# Patient Record
Sex: Female | Born: 1945 | Race: White | Hispanic: No | Marital: Married | Smoking: Current some day smoker
Health system: Southern US, Community
[De-identification: ages and names within clinical notes are randomized; demographics above are authoritative.]

## PROBLEM LIST (undated history)

## (undated) DIAGNOSIS — R300 Dysuria: Secondary | ICD-10-CM

## (undated) DIAGNOSIS — N362 Urethral caruncle: Secondary | ICD-10-CM

## (undated) DIAGNOSIS — C4490 Unspecified malignant neoplasm of skin, unspecified: Secondary | ICD-10-CM

## (undated) DIAGNOSIS — Z923 Personal history of irradiation: Secondary | ICD-10-CM

## (undated) DIAGNOSIS — R102 Pelvic and perineal pain: Secondary | ICD-10-CM

## (undated) DIAGNOSIS — D510 Vitamin B12 deficiency anemia due to intrinsic factor deficiency: Secondary | ICD-10-CM

## (undated) DIAGNOSIS — I119 Hypertensive heart disease without heart failure: Secondary | ICD-10-CM

## (undated) DIAGNOSIS — N904 Leukoplakia of vulva: Secondary | ICD-10-CM

## (undated) DIAGNOSIS — I10 Essential (primary) hypertension: Secondary | ICD-10-CM

## (undated) DIAGNOSIS — Z87442 Personal history of urinary calculi: Secondary | ICD-10-CM

## (undated) DIAGNOSIS — I255 Ischemic cardiomyopathy: Secondary | ICD-10-CM

## (undated) DIAGNOSIS — D071 Carcinoma in situ of vulva: Secondary | ICD-10-CM

## (undated) DIAGNOSIS — C719 Malignant neoplasm of brain, unspecified: Secondary | ICD-10-CM

## (undated) DIAGNOSIS — Z972 Presence of dental prosthetic device (complete) (partial): Secondary | ICD-10-CM

## (undated) DIAGNOSIS — Z720 Tobacco use: Secondary | ICD-10-CM

## (undated) DIAGNOSIS — Z87891 Personal history of nicotine dependence: Secondary | ICD-10-CM

## (undated) DIAGNOSIS — R42 Dizziness and giddiness: Secondary | ICD-10-CM

## (undated) DIAGNOSIS — K589 Irritable bowel syndrome without diarrhea: Secondary | ICD-10-CM

## (undated) DIAGNOSIS — N9089 Other specified noninflammatory disorders of vulva and perineum: Secondary | ICD-10-CM

## (undated) DIAGNOSIS — F419 Anxiety disorder, unspecified: Secondary | ICD-10-CM

## (undated) DIAGNOSIS — I2510 Atherosclerotic heart disease of native coronary artery without angina pectoris: Secondary | ICD-10-CM

## (undated) DIAGNOSIS — I219 Acute myocardial infarction, unspecified: Secondary | ICD-10-CM

## (undated) DIAGNOSIS — E785 Hyperlipidemia, unspecified: Secondary | ICD-10-CM

## (undated) HISTORY — DX: Tobacco use: Z72.0

## (undated) HISTORY — DX: Other specified noninflammatory disorders of vulva and perineum: N90.89

## (undated) HISTORY — DX: Hypertensive heart disease without heart failure: I11.9

## (undated) HISTORY — PX: CERVICAL BIOPSY: SHX590

## (undated) HISTORY — DX: Irritable bowel syndrome, unspecified: K58.9

## (undated) HISTORY — DX: Atherosclerotic heart disease of native coronary artery without angina pectoris: I25.10

## (undated) HISTORY — DX: Unspecified malignant neoplasm of skin, unspecified: C44.90

## (undated) HISTORY — PX: BRAIN SURGERY: SHX531

## (undated) HISTORY — DX: Vitamin B12 deficiency anemia due to intrinsic factor deficiency: D51.0

## (undated) HISTORY — DX: Ischemic cardiomyopathy: I25.5

## (undated) HISTORY — DX: Essential (primary) hypertension: I10

## (undated) HISTORY — PX: MOHS SURGERY: SHX181

## (undated) HISTORY — DX: Personal history of nicotine dependence: Z87.891

## (undated) HISTORY — PX: CHOLECYSTECTOMY: SHX55

## (undated) HISTORY — PX: SKIN BIOPSY: SHX1

## (undated) HISTORY — DX: Personal history of urinary calculi: Z87.442

## (undated) HISTORY — PX: APPENDECTOMY: SHX54

## (undated) HISTORY — DX: Urethral caruncle: N36.2

## (undated) HISTORY — DX: Dysuria: R30.0

## (undated) HISTORY — DX: Hyperlipidemia, unspecified: E78.5

## (undated) HISTORY — DX: Pelvic and perineal pain: R10.2

## (undated) HISTORY — DX: Carcinoma in situ of vulva: D07.1

## (undated) HISTORY — DX: Leukoplakia of vulva: N90.4

---

## 1976-05-13 HISTORY — PX: ABDOMINAL HYSTERECTOMY: SHX81

## 2003-05-10 ENCOUNTER — Other Ambulatory Visit: Payer: Self-pay

## 2005-06-12 ENCOUNTER — Ambulatory Visit: Payer: Self-pay | Admitting: Unknown Physician Specialty

## 2005-06-20 ENCOUNTER — Ambulatory Visit: Payer: Self-pay | Admitting: Unknown Physician Specialty

## 2005-07-05 ENCOUNTER — Ambulatory Visit: Payer: Self-pay | Admitting: Gastroenterology

## 2006-02-24 ENCOUNTER — Other Ambulatory Visit: Payer: Self-pay

## 2006-02-28 ENCOUNTER — Ambulatory Visit: Payer: Self-pay | Admitting: Surgery

## 2006-06-23 ENCOUNTER — Ambulatory Visit: Payer: Self-pay | Admitting: Unknown Physician Specialty

## 2007-04-24 ENCOUNTER — Other Ambulatory Visit: Payer: Self-pay

## 2007-04-24 ENCOUNTER — Ambulatory Visit: Payer: Self-pay | Admitting: Surgery

## 2007-04-30 ENCOUNTER — Ambulatory Visit: Payer: Self-pay | Admitting: Surgery

## 2007-07-23 ENCOUNTER — Ambulatory Visit: Payer: Self-pay | Admitting: Unknown Physician Specialty

## 2010-02-22 ENCOUNTER — Ambulatory Visit: Payer: Self-pay | Admitting: Family Medicine

## 2010-06-12 NOTE — Assessment & Plan Note (Signed)
Summary: FLU SHOT/JBB  Nurse Visit   Immunizations Administered:  Influenza Vaccine:    Vaccine Type: FLUZONE    Site: right deltoid    Mfr: Sanofi Pasteur    Dose: 0.5 ml    Route: IM    Given by: Levonne Spiller EMT-P    Exp. Date: 10/23/2010    Lot #: [id number]    VIS given: 12/05/09 version given February 22, 2010.   Immunizations Administered:  Influenza Vaccine:    Vaccine Type: FLUZONE    Site: right deltoid    Mfr: Sanofi Pasteur    Dose: 0.5 ml    Route: IM    Given by: Levonne Spiller EMT-P    Exp. Date: 10/23/2010    Lot #: [id number]    VIS given: 12/05/09 version given February 22, 2010.  Flu Vaccine Consent Questions:    Do you have a history of severe allergic reactions to this vaccine? no    Any prior history of allergic reactions to egg and/or gelatin? no    Do you have a sensitivity to the preservative Thimersol? no    Do you have a past history of Guillan-Barre Syndrome? no    Do you currently have an acute febrile illness? no    Have you ever had a severe reaction to latex? no    Vaccine information given and explained to patient? yes    Are you currently pregnant? no

## 2010-09-19 ENCOUNTER — Ambulatory Visit: Payer: Self-pay | Admitting: Unknown Physician Specialty

## 2011-05-08 LAB — HM PAP SMEAR: HM Pap smear: NORMAL

## 2011-08-07 ENCOUNTER — Ambulatory Visit: Payer: Self-pay | Admitting: Gastroenterology

## 2011-08-12 LAB — PATHOLOGY REPORT

## 2011-10-06 LAB — HM COLONOSCOPY

## 2012-01-06 ENCOUNTER — Ambulatory Visit (INDEPENDENT_AMBULATORY_CARE_PROVIDER_SITE_OTHER): Payer: Medicare Other | Admitting: Internal Medicine

## 2012-01-06 ENCOUNTER — Encounter: Payer: Self-pay | Admitting: Internal Medicine

## 2012-01-06 VITALS — BP 122/82 | HR 72 | Temp 98.7°F | Ht 64.5 in | Wt 161.5 lb

## 2012-01-06 DIAGNOSIS — R195 Other fecal abnormalities: Secondary | ICD-10-CM | POA: Insufficient documentation

## 2012-01-06 DIAGNOSIS — Z1239 Encounter for other screening for malignant neoplasm of breast: Secondary | ICD-10-CM | POA: Insufficient documentation

## 2012-01-06 DIAGNOSIS — E785 Hyperlipidemia, unspecified: Secondary | ICD-10-CM | POA: Insufficient documentation

## 2012-01-06 DIAGNOSIS — I10 Essential (primary) hypertension: Secondary | ICD-10-CM | POA: Insufficient documentation

## 2012-01-06 NOTE — Assessment & Plan Note (Signed)
Patient reports that stool has become darker in color since starting multivitamin prescribed by her ophthalmologist. Suspect this is related to increased intake of beta carotene. Patient is up-to-date on her colonoscopy. However, will send stool for Hemoccult.

## 2012-01-06 NOTE — Assessment & Plan Note (Signed)
Will check lipids with labs in December 2013.

## 2012-01-06 NOTE — Progress Notes (Signed)
Subjective:    Patient ID: Susan Mcmillan, female    DOB: Sep 18, 1945, 66 y.o.   MRN: [id number]  HPI 66 year old female with history of hypertension presents to establish care. She reports that she is generally feeling well. She reports full compliance with her medications. She denies any recent chest pain, headache, palpitations, or other complaints. She is unsure the last time she had lab work performed. She reports that she is overdue for her mammogram. She does note that she is up-to-date on her colonoscopy. She reports that ever since starting a new multivitamin provided by her ophthalmologist, she has noticed some darker stools. She denies any frank blood in her stool. She has not had any diarrhea or constipation. 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  Outpatient Encounter Prescriptions as of 01/06/2012  Medication Sig Dispense Refill  . ALPRAZolam (XANAX) 0.25 MG tablet Take 0.5 mg by mouth at bedtime as needed.      . beta carotene w/minerals (OCUVITE) tablet Take 1 tablet by mouth daily.      . Cholecalciferol (VITAMIN D-3) 1000 UNITS CAPS Take 1 capsule by mouth daily.      . folic acid (FOLVITE) 400 MCG tablet Take 400 mcg by mouth daily.      . metoprolol succinate (TOPROL-XL) 50 MG 24 hr tablet Take 50 mg by mouth daily. Take with or immediately following a meal.      . Nutritional Supplements (ESTROVEN WEIGHT MANAGEMENT PO) Take 1 tablet by mouth daily.      Marland Kitchen triamterene-hydrochlorothiazide (MAXZIDE-25) 37.5-25 MG per tablet Take 1 tablet by mouth daily.       BP 122/82  Pulse 72  Temp 98.7 F (37.1 C) (Oral)  Ht 5' 4.5" (1.638 m)  Wt 161 lb 8 oz (73.256 kg)  BMI 27.29 kg/m2  SpO2 96%  Review of Systems  Constitutional: Negative for fever, chills, appetite change, fatigue and unexpected weight change.  HENT: Negative for ear pain, congestion, sore throat, trouble swallowing, neck pain, voice change  and sinus pressure.   Eyes: Negative for visual disturbance.  Respiratory: Negative for cough, shortness of breath, wheezing and stridor.   Cardiovascular: Negative for chest pain, palpitations and leg swelling.  Gastrointestinal: Negative for nausea, vomiting, abdominal pain, diarrhea, constipation, blood in stool, abdominal distention and anal bleeding.  Genitourinary: Negative for dysuria and flank pain.  Musculoskeletal: Negative for myalgias, arthralgias and gait problem.  Skin: Negative for color change and rash.  Neurological: Negative for dizziness and headaches.  Hematological: Negative for adenopathy. Does not bruise/bleed easily.  Psychiatric/Behavioral: Negative for suicidal ideas, disturbed wake/sleep cycle and dysphoric mood. The patient is not nervous/anxious.        Objective:   Physical Exam  Constitutional: She is oriented to person, place, and time. She appears well-developed and well-nourished. No distress.  HENT:  Head: Normocephalic and atraumatic.  Right Ear: External ear normal.  Left Ear: External ear normal.  Nose: Nose normal.  Mouth/Throat: Oropharynx is clear and moist. No oropharyngeal exudate.  Eyes: Conjunctivae are normal. Pupils are equal, round, and reactive to light. Right eye exhibits no discharge. Left eye exhibits no discharge. No scleral icterus.  Neck: Normal range of motion. Neck supple. No tracheal deviation present. No thyromegaly present.  Cardiovascular: Normal rate, regular rhythm, normal heart sounds and intact distal pulses.  Exam reveals no gallop and no friction rub.   No murmur heard. Pulmonary/Chest: Effort normal and  breath sounds normal. No respiratory distress. She has no wheezes. She has no rales. She exhibits no tenderness.  Abdominal: Soft. Bowel sounds are normal. She exhibits no distension and no mass. There is no tenderness. There is no guarding.  Musculoskeletal: Normal range of motion. She exhibits no edema and no  tenderness.  Lymphadenopathy:    She has no cervical adenopathy.  Neurological: She is alert and oriented to person, place, and time. No cranial nerve deficit. She exhibits normal muscle tone. Coordination normal.  Skin: Skin is warm and dry. No rash noted. She is not diaphoretic. No erythema. No pallor.  Psychiatric: She has a normal mood and affect. Her behavior is normal. Judgment and thought content normal.          Assessment & Plan:

## 2012-01-06 NOTE — Assessment & Plan Note (Signed)
Blood pressure well-controlled today. We'll continue current medications. Patient would prefer to defer lab work until physical exam in December 2013. We'll check renal function with labs at that time.

## 2012-01-07 ENCOUNTER — Encounter: Payer: Self-pay | Admitting: Internal Medicine

## 2012-01-07 ENCOUNTER — Ambulatory Visit: Payer: Self-pay | Admitting: Internal Medicine

## 2012-01-08 ENCOUNTER — Other Ambulatory Visit: Payer: Self-pay | Admitting: Internal Medicine

## 2012-01-08 DIAGNOSIS — Z1211 Encounter for screening for malignant neoplasm of colon: Secondary | ICD-10-CM

## 2012-01-09 ENCOUNTER — Telehealth: Payer: Self-pay | Admitting: Internal Medicine

## 2012-01-09 ENCOUNTER — Other Ambulatory Visit: Payer: Medicare Other

## 2012-01-09 DIAGNOSIS — Z1211 Encounter for screening for malignant neoplasm of colon: Secondary | ICD-10-CM

## 2012-01-09 NOTE — Telephone Encounter (Signed)
Mammogram showed some dense areas in the left breast. They recommended compression films.  Has this been set up Delford Field)?

## 2012-01-10 NOTE — Telephone Encounter (Signed)
Patient advised as instructed via telephone, she will call to schedule additional views.

## 2012-01-14 ENCOUNTER — Other Ambulatory Visit (INDEPENDENT_AMBULATORY_CARE_PROVIDER_SITE_OTHER): Payer: Medicare Other | Admitting: *Deleted

## 2012-01-14 DIAGNOSIS — I10 Essential (primary) hypertension: Secondary | ICD-10-CM

## 2012-01-14 DIAGNOSIS — Z79899 Other long term (current) drug therapy: Secondary | ICD-10-CM

## 2012-01-14 DIAGNOSIS — R195 Other fecal abnormalities: Secondary | ICD-10-CM

## 2012-01-14 LAB — CBC WITH DIFFERENTIAL/PLATELET
Basophils Absolute: 0 10*3/uL (ref 0.0–0.1)
Eosinophils Relative: 1.3 % (ref 0.0–5.0)
HCT: 43.4 % (ref 36.0–46.0)
Lymphs Abs: 1.6 10*3/uL (ref 0.7–4.0)
MCV: 96.2 fl (ref 78.0–100.0)
Monocytes Absolute: 0.5 10*3/uL (ref 0.1–1.0)
Platelets: 258 10*3/uL (ref 150.0–400.0)
RDW: 13 % (ref 11.5–14.6)

## 2012-01-14 LAB — FERRITIN: Ferritin: 24 ng/mL (ref 10.0–291.0)

## 2012-01-16 NOTE — Progress Notes (Signed)
Patient states she is not going her blood work was fine.

## 2012-01-17 ENCOUNTER — Telehealth: Payer: Self-pay | Admitting: Internal Medicine

## 2012-01-17 NOTE — Telephone Encounter (Signed)
Calling patient to give her an appointment with Dr. Bluford Kaufmann . She informed me she is better and will not need the appointment.

## 2012-01-21 ENCOUNTER — Encounter: Payer: Self-pay | Admitting: Internal Medicine

## 2012-01-29 ENCOUNTER — Ambulatory Visit: Payer: Self-pay | Admitting: Internal Medicine

## 2012-01-29 LAB — HM MAMMOGRAPHY

## 2012-01-31 ENCOUNTER — Telehealth: Payer: Self-pay | Admitting: Internal Medicine

## 2012-01-31 NOTE — Telephone Encounter (Signed)
Patient advised as instructed via telephone. 

## 2012-01-31 NOTE — Telephone Encounter (Signed)
[place] of left breast showed benign appearing lymph nodes. Recommended repeat mammogram in 6 months.

## 2012-02-06 NOTE — Progress Notes (Signed)
[facility] will give a call back to schedule patient a follow up appointment.

## 2012-02-19 ENCOUNTER — Encounter: Payer: Self-pay | Admitting: Internal Medicine

## 2012-05-07 ENCOUNTER — Ambulatory Visit (INDEPENDENT_AMBULATORY_CARE_PROVIDER_SITE_OTHER): Payer: Medicare Other | Admitting: Internal Medicine

## 2012-05-07 ENCOUNTER — Encounter: Payer: Self-pay | Admitting: Internal Medicine

## 2012-05-07 VITALS — BP 120/80 | HR 61 | Temp 97.7°F | Resp 16 | Ht 63.0 in | Wt 160.5 lb

## 2012-05-07 DIAGNOSIS — E785 Hyperlipidemia, unspecified: Secondary | ICD-10-CM

## 2012-05-07 DIAGNOSIS — Z1331 Encounter for screening for depression: Secondary | ICD-10-CM

## 2012-05-07 DIAGNOSIS — F17200 Nicotine dependence, unspecified, uncomplicated: Secondary | ICD-10-CM

## 2012-05-07 DIAGNOSIS — D649 Anemia, unspecified: Secondary | ICD-10-CM

## 2012-05-07 DIAGNOSIS — R9431 Abnormal electrocardiogram [ECG] [EKG]: Secondary | ICD-10-CM

## 2012-05-07 DIAGNOSIS — Z0000 Encounter for general adult medical examination without abnormal findings: Secondary | ICD-10-CM

## 2012-05-07 DIAGNOSIS — Z720 Tobacco use: Secondary | ICD-10-CM | POA: Insufficient documentation

## 2012-05-07 DIAGNOSIS — Z1239 Encounter for other screening for malignant neoplasm of breast: Secondary | ICD-10-CM

## 2012-05-07 DIAGNOSIS — I10 Essential (primary) hypertension: Secondary | ICD-10-CM

## 2012-05-07 DIAGNOSIS — R195 Other fecal abnormalities: Secondary | ICD-10-CM

## 2012-05-07 DIAGNOSIS — N951 Menopausal and female climacteric states: Secondary | ICD-10-CM

## 2012-05-07 LAB — LIPID PANEL
HDL: 39.7 mg/dL (ref >39.00)
Total CHOL/HDL Ratio: 6
VLDL: 49.6 mg/dL — ABNORMAL HIGH (ref 0.0–40.0)

## 2012-05-07 LAB — CBC WITH DIFFERENTIAL/PLATELET
Basophils Relative: 0.7 % (ref 0.0–3.0)
Eosinophils Relative: 1 % (ref 0.0–5.0)
HCT: 42.5 % (ref 36.0–46.0)
Hemoglobin: 14.7 g/dL (ref 12.0–15.0)
Lymphocytes Relative: 24.7 % (ref 12.0–46.0)
Lymphs Abs: 1.5 10*3/uL (ref 0.7–4.0)
Monocytes Relative: 7.4 % (ref 3.0–12.0)
Neutro Abs: 4.1 10*3/uL (ref 1.4–7.7)
RBC: 4.58 Mil/uL (ref 3.87–5.11)
WBC: 6.3 10*3/uL (ref 4.5–10.5)

## 2012-05-07 LAB — COMPREHENSIVE METABOLIC PANEL
ALT: 20 U/L (ref 0–35)
CO2: 29 mEq/L (ref 19–32)
Calcium: 9.5 mg/dL (ref 8.4–10.5)
Chloride: 98 mEq/L (ref 96–112)
Creatinine, Ser: 0.8 mg/dL (ref 0.4–1.2)
GFR: 77.37 mL/min (ref >60.00)
Glucose, Bld: 92 mg/dL (ref 70–99)
Total Bilirubin: 0.6 mg/dL (ref 0.3–1.2)

## 2012-05-07 LAB — LDL CHOLESTEROL, DIRECT: Direct LDL: 147.2 mg/dL

## 2012-05-07 LAB — MICROALBUMIN / CREATININE URINE RATIO: Microalb Creat Ratio: 0.2 mg/g (ref 0.0–30.0)

## 2012-05-07 MED ORDER — METOPROLOL SUCCINATE ER 50 MG PO TB24: 50.0000 mg | ORAL_TABLET | Freq: Every day | ORAL | Status: DC

## 2012-05-07 MED ORDER — TRIAMTERENE-HCTZ 37.5-25 MG PO TABS: 0.5000 | ORAL_TABLET | Freq: Every day | ORAL | Status: DC

## 2012-05-07 NOTE — Assessment & Plan Note (Addendum)
Pt with persistent hot flashes. Discussed possibly using OTC meds such as soy or black cohash. Also discussed hormone replacement and potential risks. Discussed using SSRI such as Paxil, as this likely has best side effect profile. Pt will continue to monitor symptoms for now and let us know if she would like to try SSRI

## 2012-05-07 NOTE — Assessment & Plan Note (Signed)
EKG shows some t-wave inversions and some ST depression. Pt is asymptomatic. Reports normal stress test in the past, so will try to get copy of this and old EKG for comparison. Pt declines repeat stress test.

## 2012-05-07 NOTE — Assessment & Plan Note (Signed)
Mammogram abnormal 01/2012, plan repeat 6 months, 07/2012

## 2012-05-07 NOTE — Assessment & Plan Note (Signed)
Encouraged smoking cessation 

## 2012-05-07 NOTE — Assessment & Plan Note (Addendum)
Colonoscopy showed no clear etiology of dark stools. Symptoms have improved. CBC showed Hgb stable.  Pt has follow up pending for upper endoscopy, but is planning to pay of medical bills from colonoscopy first. She will schedule once she is financially able.

## 2012-05-07 NOTE — Assessment & Plan Note (Addendum)
General medical exam including breast exam normal today.  PAP and pelvic deferred as s/p hysterectomy. Health maintenance is UTD except for Zostavax, which pt declines. Recent Mammogram 01/2012, was BIRADS 3, and needs repeat 07/2012.  EKG today shows some non-specific changes, so will request records on previous EKG and stress test.  Encouraged healthy diet, low in saturated fat and high in fiber.  Encouraged regular physical activity such as walking with goal of 3 x per week. Appropriate screening performed. Follow up 6 months and prn.

## 2012-05-07 NOTE — Assessment & Plan Note (Signed)
BP well controlled today. Renal function normal. Continue current medications. Follow up 6 months and prn.

## 2012-05-07 NOTE — Progress Notes (Signed)
Subjective:    Patient ID: Susan Mcmillan, female    DOB: 10-21-1945, 66 y.o.   MRN: [id number]  HPI The patient is here for annual Medicare wellness examination and management of other chronic and acute problems.   The risk factors are reflected in the social history.  The roster of all physicians providing medical care to patient - is listed in the Snapshot section of the chart.  Activities of daily living:  The patient is 100% independent in all ADLs: dressing, toileting, feeding as well as independent mobility  Home safety : The patient has smoke detectors in the home. They wear seatbelts.  There are locked firearms at home. There is no violence in the home.   There is no risks for hepatitis, STDs or HIV. There is no history of blood transfusion. They have no travel history to infectious disease endemic areas of the world.  The patient has seen their dentist in the last six month. [facility] - [facility] They have seen their eye doctor in the last year. Opthalmology - [facility] No issue with hearing. They have deferred audiologic testing in the last year.   They do not  have excessive sun exposure. Discussed the need for sun protection: hats, long sleeves and use of sunscreen if there is significant sun exposure. Derm - Dr. Orson Aloe.  Diet: the importance of a healthy diet is discussed. They do have a healthy diet.  The benefits of regular aerobic exercise were discussed. She walks 2x per week.  Depression screen: there are no signs or vegative symptoms of depression- irritability, change in appetite, anhedonia, sadness/tearfullness.  Cognitive assessment: the patient manages all their financial and personal affairs and is actively engaged. They could relate day,date,year and events.  HCPOA - Hermelinda Dellen and Corliss Marcus.  The following portions of the patient's history were reviewed and updated as appropriate: allergies, current medications, past family  history, past medical history,  past surgical history, past social history  and problem list.  Visual acuity was not assessed per patient preference since she has regular follow up with her ophthalmologist. Hearing and body mass index were assessed and reviewed.   During the course of the visit the patient was educated and counseled about appropriate screening and preventive services including : fall prevention , diabetes screening, nutrition counseling, colorectal cancer screening, and recommended immunizations.    Only concern today is persistent hot flashes which occur on a daily basis and affect quality of life. Tried black cohash with no improvement.   Outpatient Encounter Prescriptions as of 05/07/2012  Medication Sig Dispense Refill  . ALPRAZolam (XANAX) 0.25 MG tablet Take 0.5 mg by mouth at bedtime as needed.      Marland Kitchen ammonium lactate (AMLACTIN) 12 % cream       . beta carotene w/minerals (OCUVITE) tablet Take 1 tablet by mouth daily.      . Cholecalciferol (VITAMIN D-3) 1000 UNITS CAPS Take 1 capsule by mouth daily.      . cyanocobalamin (,VITAMIN B-12,) 1000 MCG/ML injection Inject 1,000 mcg into the muscle every 30 (thirty) days.      . folic acid (FOLVITE) 400 MCG tablet Take 400 mcg by mouth daily.      . metoprolol succinate (TOPROL-XL) 50 MG 24 hr tablet Take 50 mg by mouth daily. Take with or immediately following a meal.      . Nutritional Supplements (ESTROVEN WEIGHT MANAGEMENT PO) Take 1 tablet by mouth daily.      Marland Kitchen  triamterene-hydrochlorothiazide (MAXZIDE-25) 37.5-25 MG per tablet Take 0.5 tablets by mouth daily.         Review of Systems  Constitutional: Negative for fever, chills, appetite change, fatigue and unexpected weight change.  HENT: Negative for ear pain, congestion, sore throat, trouble swallowing, neck pain, voice change and sinus pressure.   Eyes: Negative for visual disturbance.  Respiratory: Negative for cough, shortness of breath, wheezing and stridor.     Cardiovascular: Negative for chest pain, palpitations and leg swelling.  Gastrointestinal: Negative for nausea, vomiting, abdominal pain, diarrhea, constipation, blood in stool, abdominal distention and anal bleeding.  Genitourinary: Negative for dysuria and flank pain.  Musculoskeletal: Negative for myalgias, arthralgias and gait problem.  Skin: Negative for color change and rash.  Neurological: Negative for dizziness and headaches.  Hematological: Negative for adenopathy. Does not bruise/bleed easily.  Psychiatric/Behavioral: Negative for suicidal ideas, sleep disturbance and dysphoric mood. The patient is not nervous/anxious.        Objective:   Physical Exam  Constitutional: She is oriented to person, place, and time. She appears well-developed and well-nourished. No distress.  HENT:  Head: Normocephalic and atraumatic.  Right Ear: External ear normal.  Left Ear: External ear normal.  Nose: Nose normal.  Mouth/Throat: Oropharynx is clear and moist. No oropharyngeal exudate.  Eyes: Conjunctivae normal are normal. Pupils are equal, round, and reactive to light. Right eye exhibits no discharge. Left eye exhibits no discharge. No scleral icterus.  Neck: Normal range of motion. Neck supple. No tracheal deviation present. No thyromegaly present.  Cardiovascular: Normal rate, regular rhythm, normal heart sounds and intact distal pulses.  Exam reveals no gallop and no friction rub.   No murmur heard. Pulmonary/Chest: Effort normal and breath sounds normal. No accessory muscle usage. Not tachypneic. No respiratory distress. She has no decreased breath sounds. She has no wheezes. She has no rhonchi. She has no rales. She exhibits no tenderness. Right breast exhibits no inverted nipple, no mass, no nipple discharge, no skin change and no tenderness. Left breast exhibits no inverted nipple, no mass, no nipple discharge, no skin change and no tenderness. Breasts are symmetrical.  Abdominal: Soft.  Bowel sounds are normal. She exhibits no distension and no mass. There is no tenderness. There is no rebound and no guarding.  Musculoskeletal: Normal range of motion. She exhibits no edema and no tenderness.  Lymphadenopathy:    She has no cervical adenopathy.  Neurological: She is alert and oriented to person, place, and time. No cranial nerve deficit. She exhibits normal muscle tone. Coordination normal.  Skin: Skin is warm and dry. No rash noted. She is not diaphoretic. No erythema. No pallor.  Psychiatric: She has a normal mood and affect. Her behavior is normal. Judgment and thought content normal.          Assessment & Plan:

## 2012-05-07 NOTE — Assessment & Plan Note (Signed)
Labs show that lipids are elevated. Goal LDL<100.  Will check with pt to see if she has ever been on statin medications.  If not, would prefer to start Lipitor 10mg  daily. Repeat lipids and LFTs in 3 months.

## 2012-05-08 MED ORDER — ATORVASTATIN CALCIUM 10 MG PO TABS: 10.0000 mg | ORAL_TABLET | Freq: Every day | ORAL | Status: DC

## 2012-05-08 NOTE — Addendum Note (Signed)
Addended by: Jackson Latino on: 05/08/2012 01:12 PM   Modules accepted: Orders

## 2012-05-13 HISTORY — PX: ABDOMINAL HERNIA REPAIR: SHX539

## 2012-05-29 ENCOUNTER — Other Ambulatory Visit: Payer: Self-pay | Admitting: Internal Medicine

## 2012-05-29 MED ORDER — CYANOCOBALAMIN 1000 MCG/ML IJ SOLN: 1000.0000 ug | INTRAMUSCULAR | Status: DC

## 2012-05-29 NOTE — Telephone Encounter (Signed)
Med filled.  

## 2012-05-29 NOTE — Telephone Encounter (Signed)
Pt called to get refill on meds.  Pt stated she left 3 message on voice mail yesterday. b12 rx  10ml bottle   [facility] Pt is out of meds. Please advise pt when this is called in.

## 2012-07-29 ENCOUNTER — Ambulatory Visit: Payer: Self-pay | Admitting: Internal Medicine

## 2012-08-03 ENCOUNTER — Telehealth: Payer: Self-pay | Admitting: Internal Medicine

## 2012-08-03 NOTE — Telephone Encounter (Signed)
Received note on mammogram from 07/29/2012. They recommended short interval follow up but did not give time for follow up. Did they discuss with patient? This was done at [facility]. We may need to call and ask them to give time for interval follow up. We should also make sure pt has follow up to discuss and review this.

## 2012-08-04 ENCOUNTER — Telehealth: Payer: Self-pay | Admitting: *Deleted

## 2012-08-04 ENCOUNTER — Other Ambulatory Visit: Payer: Self-pay | Admitting: Internal Medicine

## 2012-08-04 DIAGNOSIS — R928 Other abnormal and inconclusive findings on diagnostic imaging of breast: Secondary | ICD-10-CM

## 2012-08-04 NOTE — Telephone Encounter (Signed)
Vernona Rieger from [facility] returned your call and she stated that patients mammogram date of 03/19 has a six month f/u time frame

## 2012-08-04 NOTE — Telephone Encounter (Signed)
Called patient and they did not discuss this with her at all and I scheduled her an appointment to see you on 4/16. Called [facility] and spoke with Juliette Alcide, she stated she would get the message to someone to give [place] some more information

## 2012-08-05 ENCOUNTER — Telehealth: Payer: Self-pay | Admitting: Internal Medicine

## 2012-08-05 NOTE — Telephone Encounter (Signed)
This information was copied and pasted to previous encounter which has more details in reference to this.

## 2012-08-05 NOTE — Telephone Encounter (Signed)
Can you let pt know this? She will need mammogram in 01/2013.

## 2012-08-05 NOTE — Telephone Encounter (Signed)
I have now received another report from the hospital in regards to ultrasound of the left breast. They recommended reevaluation of a nodule in the left breast. I would like to set up referral to general surgery for further evaluation. We can proceed with this if patient agrees. Alternatively, she could come in for her visit here first

## 2012-08-05 NOTE — Telephone Encounter (Signed)
Algis Downs, CMA at 08/04/2012 2:46 PM   Status: Signed            Vernona Rieger from [facility] returned your call and she stated that patients mammogram date of 03/19 has a six month f/u time frame

## 2012-08-05 NOTE — Telephone Encounter (Signed)
Left message to call back  

## 2012-08-05 NOTE — Telephone Encounter (Signed)
Spoke with patient, she stated she received a letter stating she should have a follow up mammogram in 6 months to check that benign area. Please advise.

## 2012-08-05 NOTE — Telephone Encounter (Signed)
I would like to see her back and review the two studies and examine her.

## 2012-08-06 NOTE — Telephone Encounter (Signed)
Please refer to previous encounter for further information. 

## 2012-08-06 NOTE — Telephone Encounter (Signed)
Appointment scheduled for tomorrow at 330

## 2012-08-06 NOTE — Telephone Encounter (Signed)
She has an appointment scheduled for tomorrow with Dr. Dan Humphreys

## 2012-08-06 NOTE — Telephone Encounter (Signed)
LMTCB

## 2012-08-07 ENCOUNTER — Ambulatory Visit (INDEPENDENT_AMBULATORY_CARE_PROVIDER_SITE_OTHER): Payer: Medicare Other | Admitting: Internal Medicine

## 2012-08-07 ENCOUNTER — Encounter: Payer: Self-pay | Admitting: Internal Medicine

## 2012-08-07 ENCOUNTER — Ambulatory Visit: Payer: Medicare Other | Admitting: Internal Medicine

## 2012-08-07 VITALS — BP 134/84 | HR 56 | Temp 98.0°F | Wt 165.0 lb

## 2012-08-07 DIAGNOSIS — R590 Localized enlarged lymph nodes: Secondary | ICD-10-CM | POA: Insufficient documentation

## 2012-08-07 DIAGNOSIS — R928 Other abnormal and inconclusive findings on diagnostic imaging of breast: Secondary | ICD-10-CM

## 2012-08-07 DIAGNOSIS — R599 Enlarged lymph nodes, unspecified: Secondary | ICD-10-CM

## 2012-08-07 NOTE — Assessment & Plan Note (Signed)
Reviewed results on abnormal mammogram and ultrasound from 07/2012. Repeat breast exam today is normal. Will continue to monitor. If any symptoms of breast pain, change in skin color, new palpable changes, pt will notify the office and return to clinic. Discussed possibly getting general surgical evaluation for evaluation and possible biopsy if any palpable lesions present. Repeat mammogram 01/2013.

## 2012-08-07 NOTE — Progress Notes (Signed)
Subjective:    Patient ID: Susan Mcmillan, female    DOB: 1946/01/13, 66 y.o.   MRN: [id number]  HPI 67YO female presents for follow up after recent abnormal mammogram. Mammogram and ultrasound showed small nodular areas left breast. Pt denies any palpable abnormalities. She denies skin changes, tenderness, discharge from the nipple.  She notes small nodule in right upper neck present approx 1 week. Seems to be getting smaller. Non-tender. No skin changes. No recent URI symptoms.  Outpatient Encounter Prescriptions as of 08/07/2012  Medication Sig Dispense Refill  . ALPRAZolam (XANAX) 0.25 MG tablet Take 0.5 mg by mouth at bedtime as needed.      Marland Kitchen ammonium lactate (AMLACTIN) 12 % cream       . atorvastatin (LIPITOR) 10 MG tablet Take 1 tablet (10 mg total) by mouth daily.  30 tablet  3  . beta carotene w/minerals (OCUVITE) tablet Take 1 tablet by mouth daily.      . Cholecalciferol (VITAMIN D-3) 1000 UNITS CAPS Take 1 capsule by mouth daily.      . cyanocobalamin (,VITAMIN B-12,) 1000 MCG/ML injection Inject 1 mL (1,000 mcg total) into the muscle every 30 (thirty) days.  10 mL  0  . folic acid (FOLVITE) 400 MCG tablet Take 400 mcg by mouth daily.      . metoprolol succinate (TOPROL-XL) 50 MG 24 hr tablet Take 1 tablet (50 mg total) by mouth daily. Take with or immediately following a meal.  90 tablet  4  . Omega-3 Fatty Acids (FISH OIL) 1000 MG CAPS Take 1,000 mg by mouth.      . triamterene-hydrochlorothiazide (MAXZIDE-25) 37.5-25 MG per tablet Take 0.5 each (0.5 tablets total) by mouth daily.  90 tablet  4  . Nutritional Supplements (ESTROVEN WEIGHT MANAGEMENT PO) Take 1 tablet by mouth daily.       No facility-administered encounter medications on file as of 08/07/2012.   BP 134/84  Pulse 56  Temp(Src) 98 F (36.7 C) (Oral)  Wt 165 lb (74.844 kg)  BMI 29.24 kg/m2  SpO2 98%  Review of Systems  Constitutional: Negative for fever, chills, appetite change, fatigue and unexpected  weight change.  HENT: Negative for ear pain, congestion, sore throat, trouble swallowing, neck pain, voice change and sinus pressure.   Eyes: Negative for visual disturbance.  Respiratory: Negative for cough, shortness of breath, wheezing and stridor.   Cardiovascular: Negative for chest pain, palpitations and leg swelling.  Gastrointestinal: Negative for nausea, vomiting, abdominal pain, diarrhea, constipation, blood in stool, abdominal distention and anal bleeding.  Genitourinary: Negative for dysuria and flank pain.  Musculoskeletal: Negative for myalgias, arthralgias and gait problem.  Skin: Negative for color change and rash.  Neurological: Negative for dizziness and headaches.  Hematological: Positive for adenopathy. Does not bruise/bleed easily.  Psychiatric/Behavioral: Negative for suicidal ideas, sleep disturbance and dysphoric mood. The patient is not nervous/anxious.        Objective:   Physical Exam  Constitutional: She is oriented to person, place, and time. She appears well-developed and well-nourished. No distress.  HENT:  Head: Normocephalic and atraumatic.  Right Ear: External ear normal.  Left Ear: External ear normal.  Nose: Nose normal.  Mouth/Throat: Oropharynx is clear and moist. No oropharyngeal exudate.  Eyes: Conjunctivae are normal. Pupils are equal, round, and reactive to light. Right eye exhibits no discharge. Left eye exhibits no discharge. No scleral icterus.  Neck: Normal range of motion. Neck supple. No tracheal deviation present. No thyromegaly present.  Pulmonary/Chest: Effort normal. No accessory muscle usage. No respiratory distress. Right breast exhibits no inverted nipple, no mass, no nipple discharge, no skin change and no tenderness. Left breast exhibits no inverted nipple, no mass, no nipple discharge, no skin change and no tenderness. Breasts are symmetrical.  Musculoskeletal: Normal range of motion. She exhibits no edema and no tenderness.    Lymphadenopathy:    She has cervical adenopathy.       Right cervical: Superficial cervical adenopathy present.  Neurological: She is alert and oriented to person, place, and time. No cranial nerve deficit. She exhibits normal muscle tone. Coordination normal.  Skin: Skin is warm and dry. No rash noted. She is not diaphoretic. No erythema. No pallor.  Psychiatric: She has a normal mood and affect. Her behavior is normal. Judgment and thought content normal.          Assessment & Plan:

## 2012-08-07 NOTE — Assessment & Plan Note (Addendum)
Small shoddy node right superficial cervical region. Will monitor for now. If persists or increases in size then will set up evaluation with imaging and biopsy. Pt will call or email with update.

## 2012-08-18 ENCOUNTER — Encounter: Payer: Self-pay | Admitting: Internal Medicine

## 2012-08-26 ENCOUNTER — Ambulatory Visit: Payer: Medicare Other | Admitting: Internal Medicine

## 2012-08-26 ENCOUNTER — Encounter: Payer: Self-pay | Admitting: Internal Medicine

## 2012-09-25 ENCOUNTER — Ambulatory Visit (INDEPENDENT_AMBULATORY_CARE_PROVIDER_SITE_OTHER): Payer: Medicare Other | Admitting: Internal Medicine

## 2012-09-25 ENCOUNTER — Encounter: Payer: Self-pay | Admitting: Internal Medicine

## 2012-09-25 VITALS — BP 110/70 | HR 61 | Temp 98.4°F | Ht 63.0 in | Wt 160.5 lb

## 2012-09-25 DIAGNOSIS — R300 Dysuria: Secondary | ICD-10-CM

## 2012-09-25 LAB — POCT URINALYSIS DIPSTICK
Bilirubin, UA: NEGATIVE
Glucose, UA: NEGATIVE
Ketones, UA: NEGATIVE
Protein, UA: NEGATIVE
Spec Grav, UA: 1.02

## 2012-09-27 ENCOUNTER — Encounter: Payer: Self-pay | Admitting: Internal Medicine

## 2012-09-27 NOTE — Progress Notes (Signed)
Subjective:    Patient ID: Susan Mcmillan, female    DOB: 1945/10/29, 67 y.o.   MRN: [id number]  HPI 67 year old female with past history of hypertension, hyperlipidemia and menopausal syndrome who comes in today with concerns regarding increased pain and swelling around the meatus.  She states that starting two weeks ago, she noticed a spot of blood on her panties.  No rectal bleeding.  Has had a hysterectomy.  She states she pushed mowed the yard and was very active and after this noticed some increased discomfort.  She reports a burning sensation around the "opening where (she) urinates".  The burning has increased.  Urination aggravates the area.  She also describes what looked like a blood blister at the opening.  No significant abdominal pain or cramping.  Occasionally will feel swollen.  No other urinary symptoms.     Past Medical History  Diagnosis Date  . IBS (irritable bowel syndrome)   . Hypertension   . Pernicious anemia     Current Outpatient Prescriptions on File Prior to Visit  Medication Sig Dispense Refill  . ALPRAZolam (XANAX) 0.25 MG tablet Take 0.5 mg by mouth at bedtime as needed.      Marland Kitchen ammonium lactate (AMLACTIN) 12 % cream       . beta carotene w/minerals (OCUVITE) tablet Take 1 tablet by mouth daily.      . Cholecalciferol (VITAMIN D-3) 1000 UNITS CAPS Take 1 capsule by mouth daily.      . cyanocobalamin (,VITAMIN B-12,) 1000 MCG/ML injection Inject 1 mL (1,000 mcg total) into the muscle every 30 (thirty) days.  10 mL  0  . folic acid (FOLVITE) 400 MCG tablet Take 400 mcg by mouth daily.      . metoprolol succinate (TOPROL-XL) 50 MG 24 hr tablet Take 1 tablet (50 mg total) by mouth daily. Take with or immediately following a meal.  90 tablet  4  . Omega-3 Fatty Acids (FISH OIL) 1000 MG CAPS Take 1,200 mg by mouth.       . triamterene-hydrochlorothiazide (MAXZIDE-25) 37.5-25 MG per tablet Take 0.5 each (0.5 tablets total) by mouth daily.  90 tablet  4  .  atorvastatin (LIPITOR) 10 MG tablet Take 1 tablet (10 mg total) by mouth daily.  30 tablet  3  . Nutritional Supplements (ESTROVEN WEIGHT MANAGEMENT PO) Take 1 tablet by mouth daily.       No current facility-administered medications on file prior to visit.    Review of Systems Pt reports no fever.  Eating and drinking well.  No significant back or abdominal pain related to this.  No vaginal itching or discharge.  Describes the burning sensation around the meatus.  No hematuria.  No increased frequency.  States the "urine aggravates the tissue when it comes out".  Describes the blood blister as outlined.  She declines any new soaps or detergents.  No recent intercourse.  Not using antibacterial wipes.      Objective:   Physical Exam Filed Vitals:   09/25/12 0921  BP: 110/70  Pulse: 61  Temp: 98.4 F (74.54 C)   67 year old female in no acute distress.   HEART:  Appears to be regular. LUNGS:  No crackles or wheezing audible.  Respirations even and unlabored.  ABDOMEN:  Soft, nontender.  Bowel sounds present and normal.  No audible abdominal bruit.  BACK:  Non tender.  No CVA tenderness.   GU:  Normal external genitalia.  Vaginal vault without  lesions.  Atrophy changes noted.  S/p hysterectomy.  No blood in the vault.  Could not appreciate any adnexal masses or tenderness.  Increased erythema and inflammation - meatus.  (did not visualize a blood blister).           Assessment & Plan:  GYN.  Symptoms and exam as outlined.  Pain, erythema and inflammation localized at the meatus.  No herpetic lesions.  Some atrophy changes.  No other evidence of infection.  Urine dip negative.  Appears to be irritated.  No new soaps or other exposures.  Very uncomfortable.  Will have her use virgin olive oil topically.  Will help to reset the pH and help soothe the area.  Avoid topical antibacterial wipes, soap, etc.  Call in a few days with an update.  If persistent problems, will require GYN evaluation.  Pt  comfortable with this plan.

## 2012-09-28 ENCOUNTER — Encounter: Payer: Self-pay | Admitting: *Deleted

## 2012-11-05 ENCOUNTER — Encounter: Payer: Self-pay | Admitting: Internal Medicine

## 2012-11-05 ENCOUNTER — Ambulatory Visit (INDEPENDENT_AMBULATORY_CARE_PROVIDER_SITE_OTHER): Payer: Medicare Other | Admitting: Internal Medicine

## 2012-11-05 VITALS — BP 118/78 | HR 57 | Temp 98.6°F | Wt 158.0 lb

## 2012-11-05 DIAGNOSIS — R599 Enlarged lymph nodes, unspecified: Secondary | ICD-10-CM

## 2012-11-05 DIAGNOSIS — IMO0002 Reserved for concepts with insufficient information to code with codable children: Secondary | ICD-10-CM

## 2012-11-05 DIAGNOSIS — R928 Other abnormal and inconclusive findings on diagnostic imaging of breast: Secondary | ICD-10-CM

## 2012-11-05 DIAGNOSIS — I10 Essential (primary) hypertension: Secondary | ICD-10-CM

## 2012-11-05 DIAGNOSIS — F17200 Nicotine dependence, unspecified, uncomplicated: Secondary | ICD-10-CM

## 2012-11-05 DIAGNOSIS — Z720 Tobacco use: Secondary | ICD-10-CM

## 2012-11-05 DIAGNOSIS — R590 Localized enlarged lymph nodes: Secondary | ICD-10-CM

## 2012-11-05 DIAGNOSIS — Q6470 Unspecified congenital malformation of bladder and urethra: Secondary | ICD-10-CM | POA: Insufficient documentation

## 2012-11-05 NOTE — Assessment & Plan Note (Signed)
BP Readings from Last 3 Encounters:  11/05/12 118/78  09/25/12 110/70  08/07/12 134/84   Blood pressure well-controlled with current medication. Will continue.

## 2012-11-05 NOTE — Assessment & Plan Note (Signed)
Encouraged smoking cessation. Pt currently using e-cig.

## 2012-11-05 NOTE — Assessment & Plan Note (Signed)
Palpable anterior cervical lymph node in the right neck at site of previous skin cancer excision. Will try to request records on previous pathology. Will set up ENT evaluation. Question if needle biopsy could be performed.

## 2012-11-05 NOTE — Progress Notes (Signed)
Subjective:    Patient ID: Susan Mcmillan, female    DOB: [birth year], 67 y.o.   MRN: [id number]  HPI 67 year old female with history of hypertension, hyperlipidemia, tobacco abuse presents for followup. She is concerned today about a nodular area at her urethral meatus which has been present for several months. She describes this as being tender. She has some burning with urination and occasional small amount of blood in her underwear. She denies any vaginal itching or burning. She denies vaginal bleeding. She denies any fever, chills, flank pain.  She is also concerned about a nodular area in her right neck. This is located at site of previous skin cancer excision. She is unsure how long this nodule has been present. It is occasionally tender. It seems to have gotten larger in size.  Outpatient Encounter Prescriptions as of 11/05/2012  Medication Sig Dispense Refill  . ALPRAZolam (XANAX) 0.25 MG tablet Take 0.5 mg by mouth at bedtime as needed.      Marland Kitchen ammonium lactate (AMLACTIN) 12 % cream       . atorvastatin (LIPITOR) 10 MG tablet Take 1 tablet (10 mg total) by mouth daily.  30 tablet  3  . beta carotene w/minerals (OCUVITE) tablet Take 1 tablet by mouth daily.      . Cholecalciferol (VITAMIN D-3) 1000 UNITS CAPS Take 1 capsule by mouth daily.      . cyanocobalamin (,VITAMIN B-12,) 1000 MCG/ML injection Inject 1 mL (1,000 mcg total) into the muscle every 30 (thirty) days.  10 mL  0  . diphenhydrAMINE (BENADRYL) 25 MG tablet Take 25 mg by mouth at bedtime.      . folic acid (FOLVITE) 400 MCG tablet Take 400 mcg by mouth daily.      . metoprolol succinate (TOPROL-XL) 50 MG 24 hr tablet Take 1 tablet (50 mg total) by mouth daily. Take with or immediately following a meal.  90 tablet  4  . Nutritional Supplements (ESTROVEN WEIGHT MANAGEMENT PO) Take 1 tablet by mouth daily.      . Omega-3 Fatty Acids (FISH OIL) 1000 MG CAPS Take 1,200 mg by mouth.       Marland Kitchen OVER THE COUNTER MEDICATION Next Step  meal replacement powder 2 scoops per 8oz of water daily      . triamterene-hydrochlorothiazide (MAXZIDE-25) 37.5-25 MG per tablet Take 0.5 each (0.5 tablets total) by mouth daily.  90 tablet  4   No facility-administered encounter medications on file as of 11/05/2012.   BP 118/78  Pulse 57  Temp(Src) 98.6 F (37 C) (Oral)  Wt 158 lb (71.668 kg)  BMI 28 kg/m2  SpO2 97%  Review of Systems  Constitutional: Negative for fever, chills, appetite change, fatigue and unexpected weight change.  HENT: Negative for ear pain, congestion, sore throat, trouble swallowing, neck pain, voice change and sinus pressure.   Eyes: Negative for visual disturbance.  Respiratory: Negative for cough, shortness of breath, wheezing and stridor.   Cardiovascular: Negative for chest pain, palpitations and leg swelling.  Gastrointestinal: Negative for nausea, vomiting, abdominal pain, diarrhea, constipation, blood in stool, abdominal distention and anal bleeding.  Genitourinary: Positive for dysuria, urgency and hematuria. Negative for frequency and flank pain.  Musculoskeletal: Negative for myalgias, arthralgias and gait problem.  Skin: Negative for color change and rash.  Neurological: Negative for dizziness and headaches.  Hematological: Positive for adenopathy. Does not bruise/bleed easily.  Psychiatric/Behavioral: Negative for suicidal ideas, sleep disturbance and dysphoric mood. The patient is not nervous/anxious.  Objective:   Physical Exam  Constitutional: She is oriented to person, place, and time. She appears well-developed and well-nourished. No distress.  HENT:  Head: Normocephalic and atraumatic.  Right Ear: External ear normal.  Left Ear: External ear normal.  Nose: Nose normal.  Mouth/Throat: Oropharynx is clear and moist. No oropharyngeal exudate.  Eyes: Conjunctivae are normal. Pupils are equal, round, and reactive to light. Right eye exhibits no discharge. Left eye exhibits no  discharge. No scleral icterus.  Neck: Normal range of motion. Neck supple. No tracheal deviation present. No thyromegaly present.    Cardiovascular: Normal rate, regular rhythm, normal heart sounds and intact distal pulses.  Exam reveals no gallop and no friction rub.   No murmur heard. Pulmonary/Chest: Effort normal and breath sounds normal. No accessory muscle usage. Not tachypneic. No respiratory distress. She has no decreased breath sounds. She has no wheezes. She has no rhonchi. She has no rales. She exhibits no tenderness.  Genitourinary:     Musculoskeletal: Normal range of motion. She exhibits no edema and no tenderness.  Lymphadenopathy:    She has no cervical adenopathy.  Neurological: She is alert and oriented to person, place, and time. No cranial nerve deficit. She exhibits normal muscle tone. Coordination normal.  Skin: Skin is warm and dry. No rash noted. She is not diaphoretic. No erythema. No pallor.  Psychiatric: She has a normal mood and affect. Her behavior is normal. Judgment and thought content normal.          Assessment & Plan:

## 2012-11-05 NOTE — Assessment & Plan Note (Signed)
Previous mammogram in March 2014 was BIRADS3 recommended 6 month followup. Follow up is scheduled for 01/2013. Discussed potential general surgery evaluation depending on findings on follow up.

## 2012-11-05 NOTE — Assessment & Plan Note (Signed)
Erythematous nodular area at 7 o'clock urethral meatus. Will set up urogynecology evaluation for possible biopsy.

## 2012-11-06 LAB — MICROALBUMIN / CREATININE URINE RATIO
Creatinine,U: 115.8 mg/dL
Microalb Creat Ratio: 0.2 mg/g (ref 0.0–30.0)
Microalb, Ur: 0.2 mg/dL (ref 0.0–1.9)

## 2012-11-06 LAB — COMPREHENSIVE METABOLIC PANEL
ALT: 21 U/L (ref 0–35)
AST: 22 U/L (ref 0–37)
Albumin: 4.3 g/dL (ref 3.5–5.2)
Alkaline Phosphatase: 78 U/L (ref 39–117)
Potassium: 4.5 mEq/L (ref 3.5–5.1)
Sodium: 140 mEq/L (ref 135–145)
Total Protein: 7.7 g/dL (ref 6.0–8.3)

## 2012-11-09 ENCOUNTER — Encounter: Payer: Self-pay | Admitting: *Deleted

## 2012-11-12 ENCOUNTER — Ambulatory Visit: Payer: Self-pay | Admitting: Otolaryngology

## 2013-01-20 ENCOUNTER — Telehealth: Payer: Self-pay | Admitting: Internal Medicine

## 2013-01-20 NOTE — Telephone Encounter (Signed)
Fwd to Dr. Walker 

## 2013-01-20 NOTE — Telephone Encounter (Signed)
Pt needs mammogram scheduled, does have history.  [facility].  Afternoon appt preferred.

## 2013-01-21 NOTE — Telephone Encounter (Signed)
Patient aware.

## 2013-01-21 NOTE — Telephone Encounter (Signed)
LVM for patient to call our office  

## 2013-01-21 NOTE — Telephone Encounter (Signed)
Patient has an apt with [facility] on 02/16/13 @ 230

## 2013-01-21 NOTE — Telephone Encounter (Signed)
Amber, Can you schedule this?

## 2013-01-29 ENCOUNTER — Ambulatory Visit: Payer: Self-pay | Admitting: Physical Medicine and Rehabilitation

## 2013-02-11 ENCOUNTER — Ambulatory Visit: Payer: Medicare Other | Admitting: Internal Medicine

## 2013-02-11 ENCOUNTER — Encounter: Payer: Self-pay | Admitting: Internal Medicine

## 2013-02-11 ENCOUNTER — Ambulatory Visit (INDEPENDENT_AMBULATORY_CARE_PROVIDER_SITE_OTHER): Payer: Medicare Other | Admitting: Internal Medicine

## 2013-02-11 VITALS — BP 110/82 | HR 56 | Temp 97.9°F | Wt 157.0 lb

## 2013-02-11 DIAGNOSIS — K429 Umbilical hernia without obstruction or gangrene: Secondary | ICD-10-CM | POA: Insufficient documentation

## 2013-02-11 DIAGNOSIS — R232 Flushing: Secondary | ICD-10-CM | POA: Insufficient documentation

## 2013-02-11 DIAGNOSIS — Z23 Encounter for immunization: Secondary | ICD-10-CM

## 2013-02-11 DIAGNOSIS — I10 Essential (primary) hypertension: Secondary | ICD-10-CM

## 2013-02-11 DIAGNOSIS — N951 Menopausal and female climacteric states: Secondary | ICD-10-CM

## 2013-02-11 NOTE — Assessment & Plan Note (Signed)
Increased frequency of hot flashes and temperature intolerance. Will check thyroid function with labs today.

## 2013-02-11 NOTE — Progress Notes (Signed)
Subjective:    Patient ID: Susan Mcmillan, female    DOB: 05/06/1946, 67 y.o.   MRN: [id number]  HPI 67YO female with h/o HTN, tobacco abuse, hyperlipidemia presents for follow up. Two main concerns today.  Hot flashes - Recently worsening. Occur several times per day and during night, waking her from sleep. Also notes some intolerance to hotter temperatures. Using topical estrogen cream, with no change in symptoms.  Umbilical hernia - s/p repair in the past with Dr. Katrinka Blazing. Would like to have repeat repair if possible this year. No persistent pain at site. Only concerned about appearance of bulging.  Outpatient Encounter Prescriptions as of 02/11/2013  Medication Sig Dispense Refill  . ALPRAZolam (XANAX) 0.25 MG tablet Take 0.5 mg by mouth at bedtime as needed.      . Cholecalciferol (VITAMIN D-3) 1000 UNITS CAPS Take 1 capsule by mouth daily.      . Cranberry 125 MG TABS Take by mouth.      . cyanocobalamin (,VITAMIN B-12,) 1000 MCG/ML injection Inject 1 mL (1,000 mcg total) into the muscle every 30 (thirty) days.  10 mL  0  . folic acid (FOLVITE) 400 MCG tablet Take 400 mcg by mouth daily.      . metoprolol succinate (TOPROL-XL) 50 MG 24 hr tablet Take 1 tablet (50 mg total) by mouth daily. Take with or immediately following a meal.  90 tablet  4  . triamterene-hydrochlorothiazide (MAXZIDE-25) 37.5-25 MG per tablet Take 0.5 each (0.5 tablets total) by mouth daily.  90 tablet  4  . ammonium lactate (AMLACTIN) 12 % cream       . atorvastatin (LIPITOR) 10 MG tablet Take 1 tablet (10 mg total) by mouth daily.  30 tablet  3  . beta carotene w/minerals (OCUVITE) tablet Take 1 tablet by mouth daily.      . diphenhydrAMINE (BENADRYL) 25 MG tablet Take 25 mg by mouth at bedtime.      . Nutritional Supplements (ESTROVEN WEIGHT MANAGEMENT PO) Take 1 tablet by mouth daily.      . Omega-3 Fatty Acids (FISH OIL) 1000 MG CAPS Take 1,200 mg by mouth.       Marland Kitchen OVER THE COUNTER MEDICATION Next Step meal  replacement powder 2 scoops per 8oz of water daily       No facility-administered encounter medications on file as of 02/11/2013.   BP 110/82  Pulse 56  Temp(Src) 97.9 F (36.6 C) (Oral)  Wt 157 lb (71.215 kg)  BMI 27.82 kg/m2  SpO2 96%  Review of Systems  Constitutional: Positive for diaphoresis. Negative for fever, chills, appetite change, fatigue and unexpected weight change.  HENT: Negative for ear pain, congestion, sore throat, trouble swallowing, neck pain, voice change and sinus pressure.   Eyes: Negative for visual disturbance.  Respiratory: Negative for cough, shortness of breath, wheezing and stridor.   Cardiovascular: Negative for chest pain, palpitations and leg swelling.  Gastrointestinal: Positive for abdominal distention. Negative for nausea, vomiting, abdominal pain, diarrhea, constipation, blood in stool and anal bleeding.  Endocrine: Positive for heat intolerance.  Genitourinary: Negative for dysuria and flank pain.  Musculoskeletal: Negative for myalgias, arthralgias and gait problem.  Skin: Negative for color change and rash.  Neurological: Negative for dizziness and headaches.  Hematological: Negative for adenopathy. Does not bruise/bleed easily.  Psychiatric/Behavioral: Negative for suicidal ideas, sleep disturbance and dysphoric mood. The patient is not nervous/anxious.        Objective:   Physical Exam  Constitutional: She is  oriented to person, place, and time. She appears well-developed and well-nourished. No distress.  HENT:  Head: Normocephalic and atraumatic.  Right Ear: External ear normal.  Left Ear: External ear normal.  Nose: Nose normal.  Mouth/Throat: Oropharynx is clear and moist. No oropharyngeal exudate.  Eyes: Conjunctivae are normal. Pupils are equal, round, and reactive to light. Right eye exhibits no discharge. Left eye exhibits no discharge. No scleral icterus.  Neck: Normal range of motion. Neck supple. No tracheal deviation present.  No thyromegaly present.  Cardiovascular: Normal rate, regular rhythm, normal heart sounds and intact distal pulses.  Exam reveals no gallop and no friction rub.   No murmur heard. Pulmonary/Chest: Effort normal and breath sounds normal. No accessory muscle usage. Not tachypneic. No respiratory distress. She has no decreased breath sounds. She has no wheezes. She has no rhonchi. She has no rales. She exhibits no tenderness.  Abdominal: Soft. Bowel sounds are normal. She exhibits no distension. There is no tenderness. A hernia is present. Hernia confirmed positive in the ventral area.    Musculoskeletal: Normal range of motion. She exhibits no edema and no tenderness.  Lymphadenopathy:    She has no cervical adenopathy.  Neurological: She is alert and oriented to person, place, and time. No cranial nerve deficit. She exhibits normal muscle tone. Coordination normal.  Skin: Skin is warm and dry. No rash noted. She is not diaphoretic. No erythema. No pallor.  Psychiatric: She has a normal mood and affect. Her behavior is normal. Judgment and thought content normal.          Assessment & Plan:

## 2013-02-11 NOTE — Assessment & Plan Note (Signed)
BP Readings from Last 3 Encounters:  02/11/13 110/82  11/05/12 118/78  09/25/12 110/70   BP well controlled on current medications. Will continue.

## 2013-02-11 NOTE — Assessment & Plan Note (Signed)
Reducible umbilical hernia. Will set up evaluation with general surgery for possible repair.

## 2013-02-12 LAB — COMPREHENSIVE METABOLIC PANEL
Alkaline Phosphatase: 70 U/L (ref 39–117)
BUN: 14 mg/dL (ref 6–23)
CO2: 29 mEq/L (ref 19–32)
GFR: 81.96 mL/min (ref >60.00)
Glucose, Bld: 79 mg/dL (ref 70–99)
Total Bilirubin: 0.7 mg/dL (ref 0.3–1.2)

## 2013-02-12 LAB — TSH: TSH: 1.48 u[IU]/mL (ref 0.35–5.50)

## 2013-02-16 ENCOUNTER — Telehealth: Payer: Self-pay | Admitting: Internal Medicine

## 2013-02-16 ENCOUNTER — Encounter: Payer: Self-pay | Admitting: *Deleted

## 2013-02-16 ENCOUNTER — Other Ambulatory Visit: Payer: Self-pay | Admitting: *Deleted

## 2013-02-16 ENCOUNTER — Ambulatory Visit: Payer: Self-pay | Admitting: Internal Medicine

## 2013-02-16 MED ORDER — ALPRAZOLAM 0.25 MG PO TABS: 0.5000 mg | ORAL_TABLET | Freq: Every evening | ORAL | Status: DC | PRN

## 2013-02-16 NOTE — Telephone Encounter (Signed)
Rx faxed to pharmacy  

## 2013-02-16 NOTE — Telephone Encounter (Signed)
Yes

## 2013-02-16 NOTE — Telephone Encounter (Signed)
Patient left a message on yesterday 10/6 stating she need a refill on Xanax sent to the pharmacy. Will you please sign off on this for me, it was supposed to have been done last week.

## 2013-02-16 NOTE — Telephone Encounter (Signed)
Spoke with pt and verified pharmacy, [facility]. Refill called to pharmacy.

## 2013-02-16 NOTE — Telephone Encounter (Signed)
The patient called and her prescription for Xanax is not at the pharmacy. She spoke with Nadara Eaton at the pharmacy this afternoon.

## 2013-02-17 LAB — HM MAMMOGRAPHY

## 2013-03-08 ENCOUNTER — Encounter: Payer: Self-pay | Admitting: Internal Medicine

## 2013-03-11 ENCOUNTER — Ambulatory Visit: Payer: Self-pay | Admitting: Surgery

## 2013-03-11 LAB — CBC
HCT: 41.5 % (ref 35.0–47.0)
HGB: 14.5 g/dL (ref 12.0–16.0)
MCHC: 35 g/dL (ref 32.0–36.0)
MCV: 91 fL (ref 80–100)
Platelet: 229 10*3/uL (ref 150–440)
RDW: 13.1 % (ref 11.5–14.5)
WBC: 8.8 10*3/uL (ref 3.6–11.0)

## 2013-03-18 ENCOUNTER — Ambulatory Visit: Payer: Self-pay | Admitting: Surgery

## 2013-05-11 ENCOUNTER — Encounter: Payer: Medicare Other | Admitting: Internal Medicine

## 2013-05-13 DIAGNOSIS — D071 Carcinoma in situ of vulva: Secondary | ICD-10-CM

## 2013-05-13 HISTORY — DX: Carcinoma in situ of vulva: D07.1

## 2013-05-20 ENCOUNTER — Encounter: Payer: Self-pay | Admitting: Internal Medicine

## 2013-05-20 ENCOUNTER — Ambulatory Visit (INDEPENDENT_AMBULATORY_CARE_PROVIDER_SITE_OTHER): Payer: Medicare HMO | Admitting: Internal Medicine

## 2013-05-20 ENCOUNTER — Encounter (INDEPENDENT_AMBULATORY_CARE_PROVIDER_SITE_OTHER): Payer: Self-pay

## 2013-05-20 VITALS — BP 134/80 | HR 56 | Temp 97.5°F | Ht 63.0 in | Wt 157.0 lb

## 2013-05-20 DIAGNOSIS — I10 Essential (primary) hypertension: Secondary | ICD-10-CM

## 2013-05-20 DIAGNOSIS — F17200 Nicotine dependence, unspecified, uncomplicated: Secondary | ICD-10-CM

## 2013-05-20 DIAGNOSIS — N362 Urethral caruncle: Secondary | ICD-10-CM | POA: Insufficient documentation

## 2013-05-20 DIAGNOSIS — Z720 Tobacco use: Secondary | ICD-10-CM

## 2013-05-20 DIAGNOSIS — Z0000 Encounter for general adult medical examination without abnormal findings: Secondary | ICD-10-CM

## 2013-05-20 DIAGNOSIS — E785 Hyperlipidemia, unspecified: Secondary | ICD-10-CM

## 2013-05-20 MED ORDER — METOPROLOL SUCCINATE ER 50 MG PO TB24
50.0000 mg | ORAL_TABLET | Freq: Every day | ORAL | Status: DC
Start: 2013-05-20 — End: 2014-07-22

## 2013-05-20 NOTE — Assessment & Plan Note (Signed)
Reviewed notes from [facility] urology. No improvement in urethral caruncle with use of topical Estrace. Will set up new GYN evaluation. Question if biopsy might be helpful for diagnosis.

## 2013-05-20 NOTE — Assessment & Plan Note (Signed)
General medical exam including breast exam normal today. Pap deferred as Pap up-to-date 2012 normal and patient status post hysterectomy. Mammogram is up-to-date. Colonoscopy up-to-date. Immunizations are up to date. Encouraged smoking cessation. Discussed screening for lung cancer with yearly chest CT. Patient would like to hold off for now because of cost. Encouraged healthy diet and regular physical activity. Will check labs including CBC, CMP, lipid profile.

## 2013-05-20 NOTE — Assessment & Plan Note (Signed)
Patient has stopped atorvastatin because of cost. Will check lipids with labs today.

## 2013-05-20 NOTE — Progress Notes (Signed)
Pre-visit discussion using our clinic review tool. No additional management support is needed unless otherwise documented below in the visit note.  

## 2013-05-20 NOTE — Progress Notes (Signed)
Subjective:    Patient ID: Susan Mcmillan, female    DOB: June 24, 1945, 68 y.o.   MRN: [id number]  HPI The patient is here for annual Medicare wellness examination and management of other chronic and acute problems.   The risk factors are reflected in the social history.  The roster of all physicians providing medical care to patient - is listed in the Snapshot section of the chart.  Activities of daily living:  The patient is 100% independent in all ADLs: dressing, toileting, feeding as well as independent mobility. Lives with husband. 7 cats and 1 dog.  Home safety : The patient has smoke detectors in the home. They wear seatbelts.  There are locked firearms at home. There is no violence in the home.   There is no risks for hepatitis, STDs or HIV. There is no history of blood transfusion. They have no travel history to infectious disease endemic areas of the world.  The patient has seen their dentist in the last six month. [facility] - [facility] They have seen their eye doctor in the last year. Opthalmology - [facility] No issue with hearing. They have deferred audiologic testing in the last year.   They do not  have excessive sun exposure. Discussed the need for sun protection: hats, long sleeves and use of sunscreen if there is significant sun exposure. Derm - Dr. Koleen Nimrod.  Diet: the importance of a healthy diet is discussed. They do have a healthy diet.  The benefits of regular aerobic exercise were discussed. She walks 2x per week. Limited by recent surgery.  Depression screen: there are no signs or vegative symptoms of depression- irritability, change in appetite, anhedonia, sadness/tearfullness.  Cognitive assessment: the patient manages all their financial and personal affairs and is actively engaged. They could relate day,date,year and events.  [place] and Charisse Klinefelter.  The following portions of the patient's history were reviewed and  updated as appropriate: allergies, current medications, past family history, past medical history,  past surgical history, past social history  and problem list.  Visual acuity was not assessed per patient preference since she has regular follow up with her ophthalmologist. Hearing and body mass index were assessed and reviewed.   During the course of the visit the patient was educated and counseled about appropriate screening and preventive services including : fall prevention , diabetes screening, nutrition counseling, colorectal cancer screening, and recommended immunizations.    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 She was not happy with this evaluation. She was instructed to use topical Estrace cream. She has had no improvement in the nodular area. She would like to consider referral to an alternative urologist.  Outpatient Encounter Prescriptions as of 05/20/2013  Medication Sig  . ALPRAZolam (XANAX) 0.25 MG tablet Take 2 tablets (0.5 mg total) by mouth at bedtime as needed.  . Cholecalciferol (VITAMIN D-3) 1000 UNITS CAPS Take 1 capsule by mouth daily.  . cyanocobalamin (,VITAMIN B-12,) 1000 MCG/ML injection Inject 1 mL (1,000 mcg total) into the muscle every 30 (thirty) days.  . folic acid (FOLVITE) 240 MCG tablet Take 400 mcg by mouth daily.  . metoprolol succinate (TOPROL-XL) 50 MG 24 hr tablet Take 1 tablet (50 mg total) by mouth daily. Take with or immediately following a meal.  . triamterene-hydrochlorothiazide (MAXZIDE-25) 37.5-25 MG per tablet Take 0.5 each (0.5 tablets total) by mouth daily.  . diphenhydrAMINE (BENADRYL) 25 MG  tablet Take 25 mg by mouth at bedtime.   BP 134/80  Pulse 56  Temp(Src) 97.5 F (36.4 C) (Oral)  Ht 5\' 3"  (1.6 m)  Wt 157 lb (71.215 kg)  BMI 27.82 kg/m2  SpO2 96%  Review of Systems  Constitutional: Negative for fever, chills, appetite change, fatigue and unexpected weight change.  HENT:  Negative for congestion, ear pain, sinus pressure, sore throat, trouble swallowing and voice change.   Eyes: Negative for visual disturbance.  Respiratory: Negative for cough, shortness of breath, wheezing and stridor.   Cardiovascular: Negative for chest pain, palpitations and leg swelling.  Gastrointestinal: Negative for nausea, vomiting, abdominal pain, diarrhea, constipation, blood in stool, abdominal distention and anal bleeding.  Genitourinary: Negative for dysuria and flank pain.  Musculoskeletal: Negative for arthralgias, gait problem, myalgias and neck pain.  Skin: Negative for color change and rash.  Neurological: Negative for dizziness and headaches.  Hematological: Negative for adenopathy. Does not bruise/bleed easily.  Psychiatric/Behavioral: Negative for suicidal ideas, sleep disturbance and dysphoric mood. The patient is not nervous/anxious.        Objective:   Physical Exam  Constitutional: She is oriented to person, place, and time. She appears well-developed and well-nourished. No distress.  HENT:  Head: Normocephalic and atraumatic.  Right Ear: External ear normal.  Left Ear: External ear normal.  Nose: Nose normal.  Mouth/Throat: Oropharynx is clear and moist. No oropharyngeal exudate.  Eyes: Conjunctivae are normal. Pupils are equal, round, and reactive to light. Right eye exhibits no discharge. Left eye exhibits no discharge. No scleral icterus.  Neck: Normal range of motion. Neck supple. No tracheal deviation present. No thyromegaly present.  Cardiovascular: Normal rate, regular rhythm, normal heart sounds and intact distal pulses.  Exam reveals no gallop and no friction rub.   No murmur heard. Pulmonary/Chest: Effort normal and breath sounds normal. No accessory muscle usage. Not tachypneic. No respiratory distress. She has no decreased breath sounds. She has no wheezes. She has no rales. She exhibits no tenderness. Right breast exhibits no inverted nipple, no mass,  no nipple discharge, no skin change and no tenderness. Left breast exhibits no inverted nipple, no mass, no nipple discharge, no skin change and no tenderness. Breasts are symmetrical.  Abdominal: Soft. Bowel sounds are normal. She exhibits no distension and no mass. There is no tenderness. There is no rebound and no guarding.  Musculoskeletal: Normal range of motion. She exhibits no edema and no tenderness.  Lymphadenopathy:    She has no cervical adenopathy.  Neurological: She is alert and oriented to person, place, and time. No cranial nerve deficit. She exhibits normal muscle tone. Coordination normal.  Skin: Skin is warm and dry. No rash noted. She is not diaphoretic. No erythema. No pallor.  Psychiatric: She has a normal mood and affect. Her behavior is normal. Judgment and thought content normal.          Assessment & Plan:

## 2013-05-20 NOTE — Assessment & Plan Note (Signed)
Encouraged looking cessation. Encouraged yearly CT chest to screening for lung cancer. Patient declines for now.

## 2013-05-20 NOTE — Assessment & Plan Note (Signed)
BP Readings from Last 3 Encounters:  05/20/13 134/80  02/11/13 110/82  11/05/12 118/78   Blood pressure well-controlled on current medications. Will continue.

## 2013-05-21 ENCOUNTER — Telehealth: Payer: Self-pay | Admitting: Internal Medicine

## 2013-05-21 ENCOUNTER — Encounter: Payer: Self-pay | Admitting: *Deleted

## 2013-05-21 LAB — COMPREHENSIVE METABOLIC PANEL
ALT: 18 U/L (ref 0–35)
AST: 20 U/L (ref 0–37)
Albumin: 4.5 g/dL (ref 3.5–5.2)
Alkaline Phosphatase: 73 U/L (ref 39–117)
BILIRUBIN TOTAL: 0.5 mg/dL (ref 0.3–1.2)
BUN: 15 mg/dL (ref 6–23)
CALCIUM: 9.8 mg/dL (ref 8.4–10.5)
CO2: 28 meq/L (ref 19–32)
CREATININE: 0.9 mg/dL (ref 0.4–1.2)
Chloride: 105 mEq/L (ref 96–112)
GFR: 65.51 mL/min (ref >60.00)
Glucose, Bld: 86 mg/dL (ref 70–99)
Potassium: 4.4 mEq/L (ref 3.5–5.1)
Sodium: 140 mEq/L (ref 135–145)
Total Protein: 7.7 g/dL (ref 6.0–8.3)

## 2013-05-21 LAB — LIPID PANEL
CHOLESTEROL: 219 mg/dL — AB (ref 0–200)
HDL: 45.3 mg/dL (ref >39.00)
TRIGLYCERIDES: 239 mg/dL — AB (ref 0.0–149.0)
Total CHOL/HDL Ratio: 5
VLDL: 47.8 mg/dL — ABNORMAL HIGH (ref 0.0–40.0)

## 2013-05-21 LAB — LDL CHOLESTEROL, DIRECT: Direct LDL: 133.8 mg/dL

## 2013-05-21 NOTE — Telephone Encounter (Signed)
Relevant patient education mailed to patient.  

## 2013-05-22 ENCOUNTER — Other Ambulatory Visit: Payer: Self-pay | Admitting: Internal Medicine

## 2013-05-24 ENCOUNTER — Telehealth: Payer: Self-pay | Admitting: Emergency Medicine

## 2013-05-24 NOTE — Telephone Encounter (Signed)
Referral for Dr. Enzo Bi has been approved with 4 visits esp 08/30/13. Auth # [id number]

## 2013-06-15 ENCOUNTER — Telehealth: Payer: Self-pay | Admitting: Internal Medicine

## 2013-06-15 NOTE — Telephone Encounter (Signed)
Relevant patient education mailed to patient.  

## 2013-06-25 ENCOUNTER — Other Ambulatory Visit: Payer: Self-pay | Admitting: *Deleted

## 2013-06-25 MED ORDER — TRIAMTERENE-HCTZ 37.5-25 MG PO TABS: ORAL_TABLET | ORAL | Status: DC

## 2013-07-11 HISTORY — PX: OTHER SURGICAL HISTORY: SHX169

## 2013-07-25 ENCOUNTER — Other Ambulatory Visit: Payer: Self-pay | Admitting: Internal Medicine

## 2013-07-27 ENCOUNTER — Ambulatory Visit: Payer: Self-pay | Admitting: Obstetrics and Gynecology

## 2013-07-27 LAB — BASIC METABOLIC PANEL
Anion Gap: 4 — ABNORMAL LOW (ref 7–16)
BUN: 13 mg/dL (ref 7–18)
CALCIUM: 9.3 mg/dL (ref 8.5–10.1)
Chloride: 102 mmol/L (ref 98–107)
Co2: 30 mmol/L (ref 21–32)
Creatinine: 0.68 mg/dL (ref 0.60–1.30)
EGFR (African American): >60
EGFR (Non-African Amer.): >60
Glucose: 87 mg/dL (ref 65–99)
Osmolality: 271 (ref 275–301)
Potassium: 3.6 mmol/L (ref 3.5–5.1)
Sodium: 136 mmol/L (ref 136–145)

## 2013-07-27 LAB — CBC
HCT: 43.6 % (ref 35.0–47.0)
HGB: 14.5 g/dL (ref 12.0–16.0)
MCH: 31.1 pg (ref 26.0–34.0)
MCHC: 33.1 g/dL (ref 32.0–36.0)
MCV: 94 fL (ref 80–100)
Platelet: 253 10*3/uL (ref 150–440)
RBC: 4.65 10*6/uL (ref 3.80–5.20)
RDW: 12.9 % (ref 11.5–14.5)
WBC: 7.9 10*3/uL (ref 3.6–11.0)

## 2013-08-02 ENCOUNTER — Ambulatory Visit: Payer: Self-pay | Admitting: Obstetrics and Gynecology

## 2013-08-04 LAB — PATHOLOGY REPORT

## 2013-10-06 DIAGNOSIS — N9089 Other specified noninflammatory disorders of vulva and perineum: Secondary | ICD-10-CM

## 2013-10-06 HISTORY — DX: Other specified noninflammatory disorders of vulva and perineum: N90.89

## 2013-11-18 ENCOUNTER — Ambulatory Visit (INDEPENDENT_AMBULATORY_CARE_PROVIDER_SITE_OTHER): Payer: Medicare HMO | Admitting: Internal Medicine

## 2013-11-18 ENCOUNTER — Encounter: Payer: Self-pay | Admitting: Internal Medicine

## 2013-11-18 VITALS — BP 120/76 | HR 66 | Temp 97.5°F | Ht 63.0 in | Wt 158.2 lb

## 2013-11-18 DIAGNOSIS — Z23 Encounter for immunization: Secondary | ICD-10-CM

## 2013-11-18 DIAGNOSIS — I10 Essential (primary) hypertension: Secondary | ICD-10-CM

## 2013-11-18 DIAGNOSIS — K589 Irritable bowel syndrome without diarrhea: Secondary | ICD-10-CM | POA: Insufficient documentation

## 2013-11-18 DIAGNOSIS — E785 Hyperlipidemia, unspecified: Secondary | ICD-10-CM

## 2013-11-18 DIAGNOSIS — E663 Overweight: Secondary | ICD-10-CM | POA: Insufficient documentation

## 2013-11-18 MED ORDER — ALPRAZOLAM 0.25 MG PO TABS: 0.5000 mg | ORAL_TABLET | Freq: Every evening | ORAL | Status: DC | PRN

## 2013-11-18 NOTE — Assessment & Plan Note (Addendum)
Symptoms chronic and tolerable per pt. Gave information on the FODMAP diet. Recent colonoscopy 2013 was normal.

## 2013-11-18 NOTE — Assessment & Plan Note (Signed)
BP Readings from Last 3 Encounters:  11/18/13 120/76  05/20/13 134/80  02/11/13 110/82   BP well controlled on current medications. Will continue. Renal function with labs today.

## 2013-11-18 NOTE — Addendum Note (Signed)
Addended by: Vernetta Honey on: 11/18/2013 05:05 PM   Modules accepted: Orders

## 2013-11-18 NOTE — Progress Notes (Signed)
Subjective:    Patient ID: Susan Mcmillan, female    DOB: [birth year], 68 y.o.   MRN: [id number]  HPI 68YO female presents for follow up.  Doing well. S/p resection of uethral caruncle.  Symptoms of IBS with intermittent diarrhea. No persistent abdominal pain. No blood in stool. Last colonoscopy 2013 was normal.  Also recently having some aching pain in her hip. Continues to walk with her dog. Contacted Dr. Sharlet Salina to consider repeat ESI for lumbar spinal stenosis. Waiting to hear back. Not currently taking anything for pain.  Would like to lose weight. Considering taking Cambogia.   Review of Systems  Constitutional: Negative for fever, chills, appetite change, fatigue and unexpected weight change.  Eyes: Negative for visual disturbance.  Respiratory: Negative for shortness of breath.   Cardiovascular: Negative for chest pain and leg swelling.  Gastrointestinal: Positive for diarrhea (intermittent, chronic). Negative for nausea, vomiting, abdominal pain, constipation and abdominal distention.  Musculoskeletal: Positive for arthralgias and myalgias.  Skin: Negative for color change and rash.  Hematological: Negative for adenopathy. Does not bruise/bleed easily.  Psychiatric/Behavioral: Negative for sleep disturbance and dysphoric mood. The patient is not nervous/anxious.        Objective:    BP 120/76  Pulse 66  Temp(Src) 97.5 F (36.4 C) (Oral)  Ht 5\' 3"  (1.6 m)  Wt 158 lb 4 oz (71.782 kg)  BMI 28.04 kg/m2  SpO2 97% Physical Exam  Constitutional: She is oriented to person, place, and time. She appears well-developed and well-nourished. No distress.  HENT:  Head: Normocephalic and atraumatic.  Right Ear: External ear normal.  Left Ear: External ear normal.  Nose: Nose normal.  Mouth/Throat: Oropharynx is clear and moist. No oropharyngeal exudate.  Eyes: Conjunctivae are normal. Pupils are equal, round, and reactive to light. Right eye exhibits no discharge. Left  eye exhibits no discharge. No scleral icterus.  Neck: Normal range of motion. Neck supple. No tracheal deviation present. No thyromegaly present.  Cardiovascular: Normal rate, regular rhythm, normal heart sounds and intact distal pulses.  Exam reveals no gallop and no friction rub.   No murmur heard. Pulmonary/Chest: Effort normal and breath sounds normal. No accessory muscle usage. Not tachypneic. No respiratory distress. She has no decreased breath sounds. She has no wheezes. She has no rhonchi. She has no rales. She exhibits no tenderness.  Musculoskeletal: Normal range of motion. She exhibits no edema and no tenderness.       Right hip: She exhibits normal range of motion, normal strength and no tenderness.  Lymphadenopathy:    She has no cervical adenopathy.  Neurological: She is alert and oriented to person, place, and time. No cranial nerve deficit. She exhibits normal muscle tone. Coordination normal.  Skin: Skin is warm and dry. No rash noted. She is not diaphoretic. No erythema. No pallor.  Psychiatric: She has a normal mood and affect. Her behavior is normal. Judgment and thought content normal.          Assessment & Plan:   Problem List Items Addressed This Visit     Unprioritized   Hypertension - Primary      BP Readings from Last 3 Encounters:  11/18/13 120/76  05/20/13 134/80  02/11/13 110/82   BP well controlled on current medications. Will continue. Renal function with labs today.    Relevant Orders      Comprehensive metabolic panel   Irritable bowel syndrome     Symptoms chronic and tolerable per pt. Gave information on  the FODMAP diet. Recent colonoscopy 2013 was normal.    Other and unspecified hyperlipidemia   Overweight (BMI 25.0-29.9)      Wt Readings from Last 3 Encounters:  11/18/13 158 lb 4 oz (71.782 kg)  05/20/13 157 lb (71.215 kg)  02/11/13 157 lb (71.215 kg)   Encouraged her not to use OTC meds for weight loss. Encouraged healthy diet and  exercise such as biking.         Return in about 6 months (around 05/21/2014) for Wellness Visit.

## 2013-11-18 NOTE — Progress Notes (Signed)
Pre visit review using our clinic review tool, if applicable. No additional management support is needed unless otherwise documented below in the visit note. 

## 2013-11-18 NOTE — Assessment & Plan Note (Signed)
Wt Readings from Last 3 Encounters:  11/18/13 158 lb 4 oz (71.782 kg)  05/20/13 157 lb (71.215 kg)  02/11/13 157 lb (71.215 kg)   Encouraged her not to use OTC meds for weight loss. Encouraged healthy diet and exercise such as biking.

## 2013-11-18 NOTE — Patient Instructions (Signed)
Continue current medications.  Labs today and prevnar today.  Follow up in 6 months or sooner as needed.

## 2013-11-19 LAB — COMPREHENSIVE METABOLIC PANEL
ALBUMIN: 4.4 g/dL (ref 3.5–5.2)
ALK PHOS: 77 U/L (ref 39–117)
ALT: 15 U/L (ref 0–35)
AST: 20 U/L (ref 0–37)
BILIRUBIN TOTAL: 0.4 mg/dL (ref 0.2–1.2)
BUN: 14 mg/dL (ref 6–23)
CO2: 28 meq/L (ref 19–32)
Calcium: 10.1 mg/dL (ref 8.4–10.5)
Chloride: 101 mEq/L (ref 96–112)
Creatinine, Ser: 0.7 mg/dL (ref 0.4–1.2)
GFR: 88.54 mL/min (ref >60.00)
GLUCOSE: 78 mg/dL (ref 70–99)
POTASSIUM: 4.4 meq/L (ref 3.5–5.1)
Sodium: 138 mEq/L (ref 135–145)
TOTAL PROTEIN: 7.8 g/dL (ref 6.0–8.3)

## 2013-11-27 ENCOUNTER — Other Ambulatory Visit: Payer: Self-pay | Admitting: Internal Medicine

## 2014-01-26 ENCOUNTER — Telehealth: Payer: Self-pay | Admitting: Internal Medicine

## 2014-01-26 DIAGNOSIS — Z1239 Encounter for other screening for malignant neoplasm of breast: Secondary | ICD-10-CM

## 2014-01-26 NOTE — Telephone Encounter (Signed)
The patient is needing an order for a mammogram . She wants an afternoon appointment. The patient was unsure if she is needing a diagnostic mammogram , she has had past problems.

## 2014-01-26 NOTE — Telephone Encounter (Signed)
Order placed. She needs regular screening mammogram.

## 2014-01-27 NOTE — Telephone Encounter (Signed)
Please see below.

## 2014-03-02 DIAGNOSIS — M7061 Trochanteric bursitis, right hip: Secondary | ICD-10-CM | POA: Insufficient documentation

## 2014-03-09 ENCOUNTER — Ambulatory Visit: Payer: Self-pay | Admitting: Internal Medicine

## 2014-03-09 LAB — HM MAMMOGRAPHY: HM MAMMO: NEGATIVE

## 2014-03-10 ENCOUNTER — Encounter: Payer: Self-pay | Admitting: *Deleted

## 2014-03-24 ENCOUNTER — Encounter: Payer: Self-pay | Admitting: Internal Medicine

## 2014-05-26 ENCOUNTER — Encounter: Payer: Medicare HMO | Admitting: Internal Medicine

## 2014-05-27 ENCOUNTER — Encounter: Payer: Self-pay | Admitting: Internal Medicine

## 2014-05-27 ENCOUNTER — Ambulatory Visit (INDEPENDENT_AMBULATORY_CARE_PROVIDER_SITE_OTHER): Payer: Commercial Managed Care - HMO | Admitting: Internal Medicine

## 2014-05-27 VITALS — BP 117/74 | HR 66 | Temp 98.1°F | Ht 62.75 in | Wt 155.2 lb

## 2014-05-27 DIAGNOSIS — Z720 Tobacco use: Secondary | ICD-10-CM | POA: Diagnosis not present

## 2014-05-27 DIAGNOSIS — Z0000 Encounter for general adult medical examination without abnormal findings: Secondary | ICD-10-CM

## 2014-05-27 DIAGNOSIS — R61 Generalized hyperhidrosis: Secondary | ICD-10-CM

## 2014-05-27 LAB — COMPREHENSIVE METABOLIC PANEL
ALBUMIN: 4.5 g/dL (ref 3.5–5.2)
ALT: 12 U/L (ref 0–35)
AST: 18 U/L (ref 0–37)
Alkaline Phosphatase: 82 U/L (ref 39–117)
BUN: 14 mg/dL (ref 6–23)
CHLORIDE: 101 meq/L (ref 96–112)
CO2: 23 mEq/L (ref 19–32)
CREATININE: 0.77 mg/dL (ref 0.50–1.10)
Calcium: 10 mg/dL (ref 8.4–10.5)
Glucose, Bld: 86 mg/dL (ref 70–99)
Potassium: 4.2 mEq/L (ref 3.5–5.3)
SODIUM: 140 meq/L (ref 135–145)
TOTAL PROTEIN: 7.7 g/dL (ref 6.0–8.3)
Total Bilirubin: 0.4 mg/dL (ref 0.2–1.2)

## 2014-05-27 LAB — LIPID PANEL
CHOL/HDL RATIO: 4.3 ratio
Cholesterol: 213 mg/dL — ABNORMAL HIGH (ref 0–200)
HDL: 50 mg/dL (ref >39)
LDL Cholesterol: 122 mg/dL — ABNORMAL HIGH (ref 0–99)
TRIGLYCERIDES: 205 mg/dL — AB (ref <150)
VLDL: 41 mg/dL — AB (ref 0–40)

## 2014-05-27 NOTE — Assessment & Plan Note (Signed)
Recent episodes of diaphoresis and lightheadedness. Will check CBC, CMP and TSH with labs. Recommended carotid dopplers given her smoking history and risk for atherosclerosis. She declines. She will call when/if she would like to schedule.

## 2014-05-27 NOTE — Assessment & Plan Note (Signed)
General medical exam including breast exam normal today. Pap deferred as Pap up-to-date 2012 normal and patient status post hysterectomy. Mammogram is up-to-date and was reviewed. Colonoscopy up-to-date. Immunizations are up to date. Encouraged smoking cessation. Discussed screening for lung cancer with yearly chest CT. Will order this. Encouraged healthy diet and regular physical activity. Will check labs including CBC, CMP, lipid profile, TSH.

## 2014-05-27 NOTE — Addendum Note (Signed)
Addended by: Karlene Einstein D on: 05/27/2014 04:00 PM   Modules accepted: Orders

## 2014-05-27 NOTE — Progress Notes (Signed)
Pre visit review using our clinic review tool, if applicable. No additional management support is needed unless otherwise documented below in the visit note. 

## 2014-05-27 NOTE — Assessment & Plan Note (Signed)
Encouraged smoking cessation. Will order screening chest CT.

## 2014-05-27 NOTE — Patient Instructions (Signed)

## 2014-05-27 NOTE — Progress Notes (Signed)
Subjective:    Patient ID: Susan Mcmillan, female    DOB: 10-01-1945, 69 y.o.   MRN: [id number]  HPI The patient is here for annual Medicare wellness examination and management of other chronic and acute problems.   The risk factors are reflected in the social history.  The roster of all physicians providing medical care to patient - is listed in the Snapshot section of the chart.  Activities of daily living:  The patient is 100% independent in all ADLs: dressing, toileting, feeding as well as independent mobility. Lives with husband. 7 cats and 1 dog.  Home safety : The patient has smoke detectors in the home. Has security system. They wear seatbelts.  There are locked firearms at home. There is no violence in the home.   There is no risks for hepatitis, STDs or HIV. There is no history of blood transfusion. They have no travel history to infectious disease endemic areas of the world.  The patient has seen their dentist in the last six month. [facility] - [facility] They have seen their eye doctor in the last year. Opthalmology - [facility] No issue with hearing. They have deferred audiologic testing in the last year.   They do not  have excessive sun exposure. Discussed the need for sun protection: hats, long sleeves and use of sunscreen if there is significant sun exposure. Derm - was Dr. Koleen Nimrod, no follow up scheduled  Diet: the importance of a healthy diet is discussed. They do have a healthy diet.  The benefits of regular aerobic exercise were discussed. She walks 2x per week. Limited by recent surgery.  Trying to cut back on smoking. Using e-cig.  Depression screen: there are no signs or vegative symptoms of depression- irritability, change in appetite, anhedonia, sadness/tearfullness.  Cognitive assessment: the patient manages all their financial and personal affairs and is actively engaged. They could relate day,date,year and events.  [place]  and Charisse Klinefelter.  The following portions of the patient's history were reviewed and updated as appropriate: allergies, current medications, past family history, past medical history,  past surgical history, past social history  and problem list.  Visual acuity was not assessed per patient preference since she has regular follow up with her ophthalmologist. Hearing and body mass index were assessed and reviewed.   During the course of the visit the patient was educated and counseled about appropriate screening and preventive services including : fall prevention , diabetes screening, nutrition counseling, colorectal cancer screening, and recommended immunizations.     ACUTE ISSUES: Having trouble with hot flashes over the last few months. Questions if this may be secondary to new medication, Gabapentin. Feels lightheaded during episodes. No chest pain or palpitations  Past medical, surgical, family and social history per today's encounter.  Review of Systems  Constitutional: Positive for diaphoresis. Negative for fever, chills, appetite change, fatigue and unexpected weight change.  Eyes: Negative for visual disturbance.  Respiratory: Negative for shortness of breath.   Cardiovascular: Negative for chest pain and leg swelling.  Gastrointestinal: Negative for nausea, vomiting, abdominal pain, diarrhea and constipation.  Musculoskeletal: Negative for myalgias and arthralgias.  Skin: Negative for color change and rash.  Neurological: Positive for light-headedness.  Hematological: Negative for adenopathy. Does not bruise/bleed easily.  Psychiatric/Behavioral: Negative for dysphoric mood. The patient is not nervous/anxious.        Objective:    BP 117/74 mmHg  Pulse 66  Temp(Src) 98.1 F (36.7 C) (  Oral)  Ht 5' 2.75" (1.594 m)  Wt 155 lb 4 oz (70.421 kg)  BMI 27.72 kg/m2  SpO2 96% Physical Exam  Constitutional: She is oriented to person, place, and time. She appears  well-developed and well-nourished. No distress.  HENT:  Head: Normocephalic and atraumatic.  Right Ear: External ear normal.  Left Ear: External ear normal.  Nose: Nose normal.  Mouth/Throat: Oropharynx is clear and moist. No oropharyngeal exudate.  Eyes: Conjunctivae are normal. Pupils are equal, round, and reactive to light. Right eye exhibits no discharge. Left eye exhibits no discharge. No scleral icterus.  Neck: Normal range of motion. Neck supple. No tracheal deviation present. No thyromegaly present.  Cardiovascular: Normal rate, regular rhythm, normal heart sounds and intact distal pulses.  Exam reveals no gallop and no friction rub.   No murmur heard. Pulmonary/Chest: Effort normal and breath sounds normal. No accessory muscle usage. No tachypnea. No respiratory distress. She has no decreased breath sounds. She has no wheezes. She has no rales. She exhibits no tenderness. Right breast exhibits no inverted nipple, no mass, no nipple discharge, no skin change and no tenderness. Left breast exhibits no inverted nipple, no mass, no nipple discharge, no skin change and no tenderness. Breasts are symmetrical.  Abdominal: Soft. Bowel sounds are normal. She exhibits no distension and no mass. There is no tenderness. There is no rebound and no guarding.  Musculoskeletal: Normal range of motion. She exhibits no edema or tenderness.  Lymphadenopathy:    She has no cervical adenopathy.  Neurological: She is alert and oriented to person, place, and time. No cranial nerve deficit. She exhibits normal muscle tone. Coordination normal.  Skin: Skin is warm and dry. No rash noted. She is not diaphoretic. No erythema. No pallor.  Psychiatric: She has a normal mood and affect. Her behavior is normal. Judgment and thought content normal.          Assessment & Plan:   Problem List Items Addressed This Visit      Unprioritized   Diaphoresis    Recent episodes of diaphoresis and lightheadedness. Will  check CBC, CMP and TSH with labs. Recommended carotid dopplers given her smoking history and risk for atherosclerosis. She declines. She will call when/if she would like to schedule.      Relevant Orders   TSH   EKG 12-Lead (Completed)   Medicare annual wellness visit, subsequent - Primary    General medical exam including breast exam normal today. Pap deferred as Pap up-to-date 2012 normal and patient status post hysterectomy. Mammogram is up-to-date and was reviewed. Colonoscopy up-to-date. Immunizations are up to date. Encouraged smoking cessation. Discussed screening for lung cancer with yearly chest CT. Will order this. Encouraged healthy diet and regular physical activity. Will check labs including CBC, CMP, lipid profile, TSH.        Relevant Orders   CBC with Differential   Comprehensive metabolic panel   Lipid panel   Microalbumin / creatinine urine ratio   Ambulatory referral to Ophthalmology   Tobacco abuse    Encouraged smoking cessation. Will order screening chest CT.      Relevant Orders   CT CHEST LOW DOSE SCREENING W/O CM       Return in about 6 months (around 11/25/2014) for Recheck.

## 2014-05-28 LAB — CBC WITH DIFFERENTIAL/PLATELET
BASOS ABS: 0.1 10*3/uL (ref 0.0–0.1)
Basophils Relative: 1 % (ref 0–1)
Eosinophils Absolute: 0.1 10*3/uL (ref 0.0–0.7)
Eosinophils Relative: 2 % (ref 0–5)
HCT: 46.3 % — ABNORMAL HIGH (ref 36.0–46.0)
Hemoglobin: 15.6 g/dL — ABNORMAL HIGH (ref 12.0–15.0)
LYMPHS PCT: 25 % (ref 12–46)
Lymphs Abs: 1.6 10*3/uL (ref 0.7–4.0)
MCH: 31.8 pg (ref 26.0–34.0)
MCHC: 33.7 g/dL (ref 30.0–36.0)
MCV: 94.3 fL (ref 78.0–100.0)
MONO ABS: 0.4 10*3/uL (ref 0.1–1.0)
MONOS PCT: 6 % (ref 3–12)
MPV: 11.3 fL (ref 8.6–12.4)
NEUTROS ABS: 4.3 10*3/uL (ref 1.7–7.7)
Neutrophils Relative %: 66 % (ref 43–77)
PLATELETS: 317 10*3/uL (ref 150–400)
RBC: 4.91 MIL/uL (ref 3.87–5.11)
RDW: 13 % (ref 11.5–15.5)
WBC: 6.5 10*3/uL (ref 4.0–10.5)

## 2014-05-28 LAB — MICROALBUMIN / CREATININE URINE RATIO
Creatinine, Urine: 28.6 mg/dL
Microalb Creat Ratio: 7 mg/g (ref 0.0–30.0)
Microalb, Ur: 0.2 mg/dL (ref <2.0)

## 2014-05-28 LAB — TSH: TSH: 1.728 u[IU]/mL (ref 0.350–4.500)

## 2014-05-30 ENCOUNTER — Encounter: Payer: Self-pay | Admitting: Internal Medicine

## 2014-05-30 ENCOUNTER — Telehealth: Payer: Self-pay | Admitting: Internal Medicine

## 2014-05-30 ENCOUNTER — Other Ambulatory Visit: Payer: Self-pay | Admitting: *Deleted

## 2014-05-30 MED ORDER — ATORVASTATIN CALCIUM 20 MG PO TABS: 20.0000 mg | ORAL_TABLET | Freq: Every day | ORAL | Status: DC

## 2014-05-30 NOTE — Telephone Encounter (Signed)
emmi mailed  °

## 2014-06-15 ENCOUNTER — Ambulatory Visit: Payer: Self-pay | Admitting: Family Medicine

## 2014-06-23 ENCOUNTER — Other Ambulatory Visit: Payer: Self-pay | Admitting: Internal Medicine

## 2014-07-04 ENCOUNTER — Other Ambulatory Visit: Payer: Medicare HMO

## 2014-07-05 ENCOUNTER — Other Ambulatory Visit (INDEPENDENT_AMBULATORY_CARE_PROVIDER_SITE_OTHER): Payer: Commercial Managed Care - HMO

## 2014-07-05 DIAGNOSIS — E785 Hyperlipidemia, unspecified: Secondary | ICD-10-CM

## 2014-07-05 LAB — COMPREHENSIVE METABOLIC PANEL
ALBUMIN: 4.3 g/dL (ref 3.5–5.2)
ALT: 21 U/L (ref 0–35)
AST: 17 U/L (ref 0–37)
Alkaline Phosphatase: 84 U/L (ref 39–117)
BUN: 19 mg/dL (ref 6–23)
CO2: 31 mEq/L (ref 19–32)
Calcium: 9.5 mg/dL (ref 8.4–10.5)
Chloride: 101 mEq/L (ref 96–112)
Creatinine, Ser: 1.01 mg/dL (ref 0.40–1.20)
GFR: 57.89 mL/min — AB (ref >60.00)
Glucose, Bld: 88 mg/dL (ref 70–99)
POTASSIUM: 3.7 meq/L (ref 3.5–5.1)
Sodium: 137 mEq/L (ref 135–145)
Total Bilirubin: 0.3 mg/dL (ref 0.2–1.2)
Total Protein: 6.9 g/dL (ref 6.0–8.3)

## 2014-07-05 LAB — LIPID PANEL
CHOLESTEROL: 155 mg/dL (ref 0–200)
HDL: 49.7 mg/dL (ref >39.00)
LDL CALC: 70 mg/dL (ref 0–99)
NonHDL: 105.3
TRIGLYCERIDES: 176 mg/dL — AB (ref 0.0–149.0)
Total CHOL/HDL Ratio: 3
VLDL: 35.2 mg/dL (ref 0.0–40.0)

## 2014-07-05 NOTE — Addendum Note (Signed)
Addended by: Johnsie Cancel on: 07/05/2014 02:14 PM   Modules accepted: Orders

## 2014-07-21 ENCOUNTER — Telehealth: Payer: Self-pay | Admitting: Internal Medicine

## 2014-07-21 NOTE — Telephone Encounter (Signed)
The patient is needing a referral appointment with Dr. Koleen Nimrod at [facility] for skin cancers.

## 2014-07-21 NOTE — Telephone Encounter (Signed)
Do I need to put in a new referral for this or is this completed thru [facility]? Thanks

## 2014-07-22 ENCOUNTER — Other Ambulatory Visit: Payer: Self-pay | Admitting: Internal Medicine

## 2014-07-22 NOTE — Telephone Encounter (Signed)
OK. So you will process this? Thanks

## 2014-07-22 NOTE — Telephone Encounter (Signed)
No   , we can do [facility] without referral if pt has an appointment

## 2014-07-26 ENCOUNTER — Ambulatory Visit: Payer: Self-pay | Admitting: Physical Medicine and Rehabilitation

## 2014-09-02 NOTE — Op Note (Signed)
PATIENT NAME:  Susan Mcmillan, Susan Mcmillan MR#:  [id number] DATE OF BIRTH:  December 17, 1945  DATE OF PROCEDURE:  03/18/2013  PREOPERATIVE DIAGNOSIS: Umbilical hernia.   POSTOPERATIVE DIAGNOSIS: Multiple ventral hernias.  PROCEDURE: Multiple ventral hernia repairs.   SURGEON: Rochel Brome, M.D.   ANESTHESIA: General.   INDICATION: This 69 year old female has had previous umbilical hernia repair and previous multiple ventral hernia repairs. Recently, has developed bulging on both sides of the umbilicus and had findings of a 3 cm bulge on each side and repair recommended for definitive treatment.   DESCRIPTION OF PROCEDURE: The patient was placed on the operating table in the supine position under general anesthesia. The abdomen was prepared with ChloraPrep and draped in a sterile manner. A noted old midline scar above and below the umbilicus. The current incision was made as a supraumbilical transversely oriented curvilinear incision and carried down through subcutaneous tissues and the incision was approximately 5 cm in length. Initially, there was a finding of a hernia on the left side, which was actually somewhat apart from the umbilicus. It was an approximately 3 cm bulge primarily omentum and properitoneal fat. It was dissected away from the fascial ring defect and reduced and separated from a fascial ring defect. Next, another hernia was found on the patient's right side, which was the sac that was dissected free from surrounding structures and separated from the fascial ring defect. There was a bridge of fascia between these two, which was approximately 1 cm wide. This bridge was divided with electrocautery and identified properitoneal fat and omentum and a short segment of small bowel, which had been herniated up into the defect. The properitoneal fat was dissected away from the fascia circumferentially and found 2 other ventral hernia defects. One was inferior and to the right and the other was superior and to  the left. Each of these ventral hernia defects were about 8 mm in dimension.   Next, a Surgipro mesh was cut to create an oval shape of some 3 x 4 cm. This was placed oriented transversely in the properitoneal plane and was sutured to the overlying fascia with 4-point fixation of through and through sutures. Next, 4 additional sutures were placed between these sutures in 4 quadrants using 0 Surgilon. Next, the fascia was closed placing a suture in the smaller defect inferiorly and laterally and also the other superiorly and laterally. Next, the fascia was closed with a transversely oriented suture line of interrupted 0 Surgilon simple sutures incorporating mesh into each suture. The repair looked good. Hemostasis was intact. The deep fascia and subcutaneous tissues were infiltrated with 0.5% Sensorcaine with epinephrine. The skin was closed with a running 4-0 Monocryl subcuticular suture and Dermabond. The patient tolerated surgery satisfactorily and was then prepared for transfer to the recovery room.  ____________________________ Lenna Sciara. Rochel Brome, MD jws:aw D: 03/18/2013 09:05:52 ET T: 03/18/2013 09:17:32 ET JOB#: [id number]  cc: Loreli Dollar, MD, <Dictator> Loreli Dollar MD ELECTRONICALLY SIGNED 03/19/2013 15:54

## 2014-09-03 NOTE — Op Note (Signed)
PATIENT NAME:  Susan Mcmillan, Susan Mcmillan MR#:  [id number] DATE OF BIRTH:  06-06-1945  DATE OF PROCEDURE:  08/02/2013  PREOPERATIVE DIAGNOSIS: Vulvar intraepithelial neoplasia-3.  POSTOPERATIVE DIAGNOSIS: Vulvar intraepithelial neoplasia-3.  PROCEDURE PERFORMED: Wide local excision of posterior fourchette.   SURGEON: Malachi Paradise, MD  FIRST ASSISTANT: Massie Bougie, PA-S  ANESTHESIA: General - LMA.    INDICATIONS: The patient is a 69 year old white female with biopsy verified VIN-3 with full extent of the lesion being seen through colposcopy, presents now for wide local excision of the abnormality.   FINDINGS AT SURGERY: Revealed acetowhite epithelium, approximately 1 x 1.5 cm, noted at the posterior fourchette. A 3 x 2 cm wide local excision. Biopsy was performed.   DESCRIPTION OF PROCEDURE: The patient was brought to the operating room where she was placed in the supine position. General anesthesia with LMA was induced without difficulty. She was placed in the dorsal lithotomy position using the candy-cane stirrups. No perineal prep was performed. Acetic acid 3% soaked gauze pads were placed on the perineum to dehydrate the tissues. After several minutes of application, the abnormal staining perineum was identified. The Allis clamps were then placed superiorly and inferiorly, at the site of the biopsy. A diamond-shaped wedge biopsy was taken using #15 blade. Superficial bleeding was controlled with Bovie cautery. A 3-0 Vicryl suture was then used to repair the excisional biopsy defect through simple interrupted techniques. Following completion of the closure, the patient was then awakened, mobilized, and taken to the recovery room in satisfactory condition.   ESTIMATED BLOOD LOSS: Less than 5 mL.   IV FLUIDS: Not quantified. Bladder was not drained.  ____________________________ Alanda Slim. Candice Lunney, MD mad:sb D: 08/02/2013 08:24:07 ET T: 08/02/2013 10:24:46 ET JOB#: [id number]  cc: Hassell Done A.  Yides Saidi, MD, <Dictator> Alanda Slim Pinchus Weckwerth MD ELECTRONICALLY SIGNED 08/06/2013 9:24

## 2014-09-13 ENCOUNTER — Telehealth: Payer: Self-pay

## 2014-09-13 NOTE — Telephone Encounter (Signed)
The patient called and is hoping to speak with Lorriane Shire regarding her blood work whenever possible.

## 2014-11-25 ENCOUNTER — Ambulatory Visit (INDEPENDENT_AMBULATORY_CARE_PROVIDER_SITE_OTHER): Payer: Medicare HMO | Admitting: Internal Medicine

## 2014-11-25 ENCOUNTER — Encounter: Payer: Self-pay | Admitting: Internal Medicine

## 2014-11-25 VITALS — BP 106/64 | HR 54 | Temp 97.7°F | Resp 12 | Ht 63.0 in | Wt 155.1 lb

## 2014-11-25 DIAGNOSIS — R202 Paresthesia of skin: Secondary | ICD-10-CM

## 2014-11-25 DIAGNOSIS — F4323 Adjustment disorder with mixed anxiety and depressed mood: Secondary | ICD-10-CM

## 2014-11-25 DIAGNOSIS — Z720 Tobacco use: Secondary | ICD-10-CM

## 2014-11-25 DIAGNOSIS — E785 Hyperlipidemia, unspecified: Secondary | ICD-10-CM

## 2014-11-25 DIAGNOSIS — D0471 Carcinoma in situ of skin of right lower limb, including hip: Secondary | ICD-10-CM

## 2014-11-25 DIAGNOSIS — R200 Anesthesia of skin: Secondary | ICD-10-CM

## 2014-11-25 DIAGNOSIS — I10 Essential (primary) hypertension: Secondary | ICD-10-CM | POA: Diagnosis not present

## 2014-11-25 LAB — COMPREHENSIVE METABOLIC PANEL
ALT: 19 U/L (ref 0–35)
AST: 24 U/L (ref 0–37)
Albumin: 4.4 g/dL (ref 3.5–5.2)
Alkaline Phosphatase: 77 U/L (ref 39–117)
BILIRUBIN TOTAL: 0.4 mg/dL (ref 0.2–1.2)
BUN: 16 mg/dL (ref 6–23)
CO2: 27 mEq/L (ref 19–32)
Calcium: 10 mg/dL (ref 8.4–10.5)
Chloride: 101 mEq/L (ref 96–112)
Creatinine, Ser: 0.87 mg/dL (ref 0.40–1.20)
GFR: 68.69 mL/min (ref >60.00)
Glucose, Bld: 83 mg/dL (ref 70–99)
Potassium: 4.1 mEq/L (ref 3.5–5.1)
Sodium: 137 mEq/L (ref 135–145)
Total Protein: 7.4 g/dL (ref 6.0–8.3)

## 2014-11-25 MED ORDER — ALPRAZOLAM 0.25 MG PO TABS: 0.5000 mg | ORAL_TABLET | Freq: Every evening | ORAL | Status: DC | PRN

## 2014-11-25 MED ORDER — TRIAMTERENE-HCTZ 37.5-25 MG PO TABS: ORAL_TABLET | ORAL | Status: DC

## 2014-11-25 NOTE — Assessment & Plan Note (Signed)
BP Readings from Last 3 Encounters:  11/25/14 106/64  05/27/14 117/74  11/18/13 120/76   BP well controlled. Renal function with labs. Continue current medication.

## 2014-11-25 NOTE — Assessment & Plan Note (Signed)
Recent lipids better controlled. Will repeat LFTs with labs today.

## 2014-11-25 NOTE — Patient Instructions (Addendum)
Labs today.  We will set up MRI brain and carotid dopplers.   Follow up 2 weeks.

## 2014-11-25 NOTE — Progress Notes (Signed)
Pre-visit discussion using our clinic review tool. No additional management support is needed unless otherwise documented below in the visit note.  

## 2014-11-25 NOTE — Progress Notes (Signed)
Subjective:    Patient ID: Susan Mcmillan, female    DOB: Feb 20, 1946, 69 y.o.   MRN: [id number]  HPI  69YO female presents for follow up.  Difficult time for her. Husband diagnosed with prostate cancer.  Seen by dermatology, had SCC on right foot. Now s/p wide resection. Dr. Nicole Kindred.  Recently, over last few weeks, having some right hand and arm numbness that comes and goes. Improves with rubbing right forearm. However, last weekend, had more significant numbness with numbness extending down side of right trunk and right leg. No weakness noted. No slurred speech. She is concerned this may be related to a stroke.  Past medical, surgical, family and social history per today's encounter.  Review of Systems  Constitutional: Negative for fever, chills, appetite change, fatigue and unexpected weight change.  Eyes: Negative for visual disturbance.  Respiratory: Negative for shortness of breath.   Cardiovascular: Negative for chest pain and leg swelling.  Gastrointestinal: Negative for vomiting, abdominal pain, diarrhea and constipation.  Musculoskeletal: Positive for myalgias, back pain and arthralgias.  Skin: Negative for color change and rash.  Neurological: Positive for numbness. Negative for dizziness, facial asymmetry, speech difficulty, weakness, light-headedness and headaches.  Hematological: Negative for adenopathy. Does not bruise/bleed easily.  Psychiatric/Behavioral: Negative for sleep disturbance and dysphoric mood. The patient is nervous/anxious.        Objective:    BP 106/64 mmHg  Pulse 54  Temp(Src) 97.7 F (36.5 C) (Oral)  Resp 12  Ht 5\' 3"  (1.6 m)  Wt 155 lb 2 oz (70.364 kg)  BMI 27.49 kg/m2  SpO2 98% Physical Exam  Constitutional: She is oriented to person, place, and time. She appears well-developed and well-nourished. No distress.  HENT:  Head: Normocephalic and atraumatic.  Right Ear: External ear normal.  Left Ear: External ear normal.  Nose: Nose  normal.  Mouth/Throat: Oropharynx is clear and moist. No oropharyngeal exudate.  Eyes: Conjunctivae are normal. Pupils are equal, round, and reactive to light. Right eye exhibits no discharge. Left eye exhibits no discharge. No scleral icterus.  Neck: Normal range of motion. Neck supple. No tracheal deviation present. No thyromegaly present.  Cardiovascular: Normal rate, regular rhythm, normal heart sounds and intact distal pulses.  Exam reveals no gallop and no friction rub.   No murmur heard. Pulmonary/Chest: Effort normal and breath sounds normal. No respiratory distress. She has no wheezes. She has no rales. She exhibits no tenderness.  Musculoskeletal: Normal range of motion. She exhibits no edema or tenderness.  Lymphadenopathy:    She has no cervical adenopathy.  Neurological: She is alert and oriented to person, place, and time. She displays no atrophy. No cranial nerve deficit or sensory deficit. She exhibits normal muscle tone. Coordination and gait normal.  Reflex Scores:      Patellar reflexes are 2+ on the right side and 2+ on the left side. Skin: Skin is warm and dry. No rash noted. She is not diaphoretic. No erythema. No pallor.  Psychiatric: She has a normal mood and affect. Her behavior is normal. Judgment and thought content normal.          Assessment & Plan:   Problem List Items Addressed This Visit      Unprioritized   Adjustment disorder with mixed anxiety and depressed mood    Offered support given her husband's new cancer diagnosis. Will continue prn Alprazolam      Hyperlipidemia    Recent lipids better controlled. Will repeat LFTs with labs today.  Relevant Medications   triamterene-hydrochlorothiazide (MAXZIDE-25) 37.5-25 MG per tablet   Hypertension    BP Readings from Last 3 Encounters:  11/25/14 106/64  05/27/14 117/74  11/18/13 120/76   BP well controlled. Renal function with labs. Continue current medication.      Relevant Medications     triamterene-hydrochlorothiazide (MAXZIDE-25) 37.5-25 MG per tablet   Other Relevant Orders   Comprehensive metabolic panel   Numbness and tingling of right arm and leg - Primary    Symptoms of numbness of right arm and leg are concerning for TIA/CVA. She has risk for stroke with extensive smoking history. Will get carotid dopplers and MRI brain. Continue aspirin and statin. If any persistent or new symptoms, she will seek care urgently.      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 Continue to follow with dermatology.      Tobacco abuse    Strongly encouraged smoking cessation.          Return in about 2 weeks (around 12/09/2014) for Recheck.

## 2014-11-25 NOTE — Assessment & Plan Note (Signed)
Strongly encouraged smoking cessation. 

## 2014-11-25 NOTE — Assessment & Plan Note (Signed)
S/p resection. Continue to follow with dermatology.

## 2014-11-25 NOTE — Assessment & Plan Note (Signed)
Symptoms of numbness of right arm and leg are concerning for TIA/CVA. She has risk for stroke with extensive smoking history. Will get carotid dopplers and MRI brain. Continue aspirin and statin. If any persistent or new symptoms, she will seek care urgently.

## 2014-11-25 NOTE — Assessment & Plan Note (Signed)
Offered support given her husband's new cancer diagnosis. Will continue prn Alprazolam

## 2014-11-26 ENCOUNTER — Other Ambulatory Visit: Payer: Self-pay | Admitting: Internal Medicine

## 2014-11-28 ENCOUNTER — Encounter: Payer: Self-pay | Admitting: *Deleted

## 2014-12-07 ENCOUNTER — Telehealth: Payer: Self-pay | Admitting: *Deleted

## 2014-12-07 ENCOUNTER — Telehealth: Payer: Self-pay | Admitting: Internal Medicine

## 2014-12-07 ENCOUNTER — Ambulatory Visit
Admission: RE | Admit: 2014-12-07 | Discharge: 2014-12-07 | Disposition: A | Discharge status: Home / Self Care | Payer: Commercial Managed Care - HMO | Source: Ambulatory Visit | Attending: Internal Medicine | Admitting: Internal Medicine

## 2014-12-07 ENCOUNTER — Other Ambulatory Visit: Payer: Self-pay | Admitting: Internal Medicine

## 2014-12-07 DIAGNOSIS — R202 Paresthesia of skin: Secondary | ICD-10-CM | POA: Insufficient documentation

## 2014-12-07 DIAGNOSIS — R531 Weakness: Secondary | ICD-10-CM

## 2014-12-07 DIAGNOSIS — R200 Anesthesia of skin: Secondary | ICD-10-CM

## 2014-12-07 NOTE — Telephone Encounter (Signed)
Radiology called with results from MRI head completed today, Please see results in chart.  Radiologist suggest an MRI with contrast to be ordered

## 2014-12-07 NOTE — Telephone Encounter (Signed)
I called pt and reviewed the MRI with her. We will set up neurology evaluation and then decide about more imaging, as she reports severe contrast allergy.

## 2014-12-07 NOTE — Telephone Encounter (Signed)
Spoke with pt and reviewed MRI. Recommended MRI brain with contrast. She reports severe allergy to contrast in the past. We discussed neurology evaluation to decide if any alternative imaging would be helpful.

## 2014-12-08 ENCOUNTER — Other Ambulatory Visit: Payer: Self-pay | Admitting: Internal Medicine

## 2014-12-08 DIAGNOSIS — S065X9A Traumatic subdural hemorrhage with loss of consciousness of unspecified duration, initial encounter: Secondary | ICD-10-CM

## 2014-12-08 DIAGNOSIS — S065XAA Traumatic subdural hemorrhage with loss of consciousness status unknown, initial encounter: Secondary | ICD-10-CM

## 2014-12-14 ENCOUNTER — Other Ambulatory Visit: Payer: Self-pay | Admitting: Neurosurgery

## 2014-12-15 ENCOUNTER — Ambulatory Visit: Payer: Medicare HMO | Admitting: Internal Medicine

## 2014-12-26 ENCOUNTER — Inpatient Hospital Stay (HOSPITAL_COMMUNITY)
Admission: RE | Admit: 2014-12-26 | Discharge: 2014-12-26 | Disposition: A | Discharge status: Home / Self Care | Payer: Medicare HMO | Source: Ambulatory Visit

## 2015-01-03 ENCOUNTER — Inpatient Hospital Stay (HOSPITAL_COMMUNITY)
Admission: RE | Admit: 2015-01-03 | Payer: Commercial Managed Care - HMO | Source: Ambulatory Visit | Admitting: Neurosurgery

## 2015-01-03 ENCOUNTER — Encounter (HOSPITAL_COMMUNITY): Admission: RE | Payer: Self-pay | Source: Ambulatory Visit

## 2015-01-03 SURGERY — ANTERIOR CERVICAL DECOMPRESSION/DISCECTOMY FUSION 1 LEVEL
Anesthesia: General

## 2015-01-26 ENCOUNTER — Encounter: Payer: Self-pay | Admitting: Obstetrics and Gynecology

## 2015-01-26 ENCOUNTER — Ambulatory Visit (INDEPENDENT_AMBULATORY_CARE_PROVIDER_SITE_OTHER): Payer: Commercial Managed Care - HMO | Admitting: Obstetrics and Gynecology

## 2015-01-26 VITALS — BP 138/84 | HR 67 | Ht 63.0 in | Wt 156.9 lb

## 2015-01-26 DIAGNOSIS — D071 Carcinoma in situ of vulva: Secondary | ICD-10-CM | POA: Diagnosis not present

## 2015-01-26 NOTE — Progress Notes (Signed)
Patient ID: Susan Mcmillan, female   DOB: 11-02-1945, 69 y.o.   MRN: [id number] 6 month vulvar colpo H/o vin 3  Chief complaint: 1.  History of VIN 3 2.  Status post wide local excision with clear margins.  Patient is asymptomatic regarding itching and burning.  OBJECTIVE:BP 138/84 mmHg  Pulse 67  Ht 5\' 3"  (1.6 m)  Wt 156 lb 14.4 oz (71.169 kg)  BMI 27.80 kg/m2 Pleasant white female in no acute distress. Pelvic exam: External genitalia-atrophic changes without gross lesions. BUS-normal Vagina-vaginal atrophy.  PROCEDURE: Vulvar colposcopy. Acetic acid is applied to the vulva in standard fashion.  Colposcopic assessment of the vulva reveals no obvious abnormal lesions.  The posterior fourchette region demonstrates faint acetowhite epithelium likely consistent with scar from prior surgical excision. IMPRESSION: Normal vulvar colposcopy PLAN: 1.  Repeat laparoscopy in 6 months. 2.  Notify M.D. If vulvar itching or irritation develops.  Brayton Mars, MD

## 2015-01-26 NOTE — Patient Instructions (Signed)
1. Normal colposcopy findings today.  No biopsies. 2.  Return in 6 months for repeat colposcopy. 3.  Return as needed if vulvar itching/irritation develops.

## 2015-02-02 ENCOUNTER — Ambulatory Visit: Payer: Self-pay | Admitting: Obstetrics and Gynecology

## 2015-02-07 ENCOUNTER — Encounter: Payer: Self-pay | Admitting: Internal Medicine

## 2015-02-07 ENCOUNTER — Ambulatory Visit (INDEPENDENT_AMBULATORY_CARE_PROVIDER_SITE_OTHER): Payer: Commercial Managed Care - HMO | Admitting: Internal Medicine

## 2015-02-07 VITALS — BP 125/80 | HR 56 | Temp 97.8°F | Ht 63.0 in | Wt 156.5 lb

## 2015-02-07 DIAGNOSIS — D0471 Carcinoma in situ of skin of right lower limb, including hip: Secondary | ICD-10-CM

## 2015-02-07 DIAGNOSIS — F4323 Adjustment disorder with mixed anxiety and depressed mood: Secondary | ICD-10-CM | POA: Diagnosis not present

## 2015-02-07 DIAGNOSIS — K5900 Constipation, unspecified: Secondary | ICD-10-CM | POA: Diagnosis not present

## 2015-02-07 DIAGNOSIS — Z23 Encounter for immunization: Secondary | ICD-10-CM

## 2015-02-07 DIAGNOSIS — M5010 Cervical disc disorder with radiculopathy, unspecified cervical region: Secondary | ICD-10-CM

## 2015-02-07 MED ORDER — FLUOXETINE HCL 20 MG PO TABS: 20.0000 mg | ORAL_TABLET | Freq: Every day | ORAL | Status: DC

## 2015-02-07 NOTE — Assessment & Plan Note (Signed)
Encouraged follow up with Dr. Trenton Gammon regarding decision about PT versus surgical intervention for radiculopathy.

## 2015-02-07 NOTE — Patient Instructions (Signed)
Start Fluoxetine 20mg  every morning.  Continue Alprazolam as needed for severe anxiety.  Follow up in 4 weeks.

## 2015-02-07 NOTE — Progress Notes (Signed)
Pre visit review using our clinic review tool, if applicable. No additional management support is needed unless otherwise documented below in the visit note. 

## 2015-02-07 NOTE — Assessment & Plan Note (Signed)
Chronic constipation and IBS. Symptoms worsened with increased stressors. Will add Fluoxetine. Continue prn milk of magnesia. Follow up in 4 weeks and prn.

## 2015-02-07 NOTE — Assessment & Plan Note (Signed)
Offered support today. Encouraged her to take time for herself. Will add Fluoxetine daily and continue prn Alprazolam. Follow up in 4 weeks and prn.

## 2015-02-07 NOTE — Progress Notes (Signed)
Subjective:    Patient ID: Susan Mcmillan, female    DOB: [birth year], 69 y.o.   MRN: [id number]  HPI  69YO female presents for acute visit.  Dr. Trenton Gammon recently looked at MRI brain. Noted degenerative disc in cervical spine. Recommended surgery. Insurance denied and wants her to start PT. Dr. Trenton Gammon was planning to appeal. Continues to have neck and right arm pain.  Teeth on right side of face and right ear have been hurting off and on. Questions sinus infection. No fever. Occasional clear sinus drainage.  HTN - BP was running higher recently with systolic in the 891Q. No CP, HA, palpitations. At [facility] 945 systolic. Worried about risk of stroke.  Feeling more "down and out." Stressed out caring for her husband and sister. Also caring for friend with cancer.  Constipation - Taking Milk of Magnesia daily to have BM. Worsened when she is upset. No blood in stool or black stool. Feels nauseous after eating at times.     Wt Readings from Last 3 Encounters:  02/07/15 156 lb 8 oz (70.988 kg)  01/26/15 156 lb 14.4 oz (71.169 kg)  12/07/14 155 lb (70.308 kg)   BP Readings from Last 3 Encounters:  02/07/15 125/80  01/26/15 138/84  11/25/14 106/64    Past Medical History  Diagnosis Date  . IBS (irritable bowel syndrome)   . Hypertension   . Pernicious anemia   . 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 Skin cancer   . Hyperlipemia   . 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 Vulvar pain   . Urethral caruncle   . Vulvar leukoplakia   . Dysuria    Family History  Problem Relation Age of Onset  . Hyperlipidemia Sister   . Hypertension Sister   . Macular degeneration Sister   . Diabetes Neg Hx   . Heart disease Neg Hx   . Cancer Neg Hx    Past Surgical History  Procedure Laterality Date  . Cholecystectomy    . Appendectomy    . Abdominal hernia repair  2014    Dr. Rochel Brome    . Cervical biopsy      Dr. Enzo Bi  . Abdominal hysterectomy  1978  . Wide local excision  07/2013    vin 3 w/ margins involoved   Social History   Social History  . Marital Status: Married    Spouse Name: N/A  . Number of Children: N/A  . Years of Education: N/A   Social History Main Topics  . Smoking status: Current Every Day Smoker -- 0.50 packs/day    Types: Cigarettes, E-cigarettes  . Smokeless tobacco: None  . Alcohol Use: 0.0 oz/week    0 Standard drinks or equivalent per week     Comment: occasion  . Drug Use: No  . Sexual Activity: Not Currently   Other Topics Concern  . None   Social History Narrative   Lives in [place]. Previously worked [employer]. Husband with dementia. Children 2 sons.    Review of Systems  Constitutional: Positive for fatigue. Negative for fever, chills, appetite change and unexpected weight change.  Eyes: Negative for visual disturbance.  Respiratory: Negative for shortness of breath.   Cardiovascular: Negative for chest pain and leg swelling.  Gastrointestinal: Positive for constipation and abdominal distention. Negative for nausea, vomiting, abdominal pain and diarrhea.  Musculoskeletal: Negative for myalgias  and arthralgias.  Skin: Negative for color change and rash.  Neurological: Positive for weakness and numbness. Negative for dizziness, light-headedness and headaches.  Hematological: Negative for adenopathy. Does not bruise/bleed easily.  Psychiatric/Behavioral: Positive for dysphoric mood. Negative for sleep disturbance. The patient is not nervous/anxious.        Objective:    BP 125/80 mmHg  Pulse 56  Temp(Src) 97.8 F (36.6 C) (Oral)  Ht 5\' 3"  (1.6 m)  Wt 156 lb 8 oz (70.988 kg)  BMI 27.73 kg/m2  SpO2 99% Physical Exam  Constitutional: She is oriented to person, place, and time. She appears well-developed and well-nourished. No distress.  HENT:  Head: Normocephalic and atraumatic.  Right Ear: External ear  normal.  Left Ear: External ear normal.  Nose: Nose normal.  Mouth/Throat: Oropharynx is clear and moist. No oropharyngeal exudate.  Eyes: Conjunctivae are normal. Pupils are equal, round, and reactive to light. Right eye exhibits no discharge. Left eye exhibits no discharge. No scleral icterus.  Neck: Normal range of motion. Neck supple. No tracheal deviation present. No thyromegaly present.  Cardiovascular: Normal rate, regular rhythm, normal heart sounds and intact distal pulses.  Exam reveals no gallop and no friction rub.   No murmur heard. Pulmonary/Chest: Effort normal and breath sounds normal. No respiratory distress. She has no wheezes. She has no rales. She exhibits no tenderness.  Musculoskeletal: Normal range of motion. She exhibits no edema or tenderness.  Lymphadenopathy:    She has no cervical adenopathy.  Neurological: She is alert and oriented to person, place, and time. No cranial nerve deficit. She exhibits normal muscle tone. Coordination normal.  Skin: Skin is warm and dry. No rash noted. She is not diaphoretic. No erythema. No pallor.     Psychiatric: Her speech is normal and behavior is normal. Judgment and thought content normal. Her mood appears anxious. Cognition and memory are normal. She exhibits a depressed mood. She expresses no suicidal ideation.          Assessment & Plan:   Problem List Items Addressed This Visit      Unprioritized   Adjustment disorder with mixed anxiety and depressed mood    Offered support today. Encouraged her to take time for herself. Will add Fluoxetine daily and continue prn Alprazolam. Follow up in 4 weeks and prn.      Relevant Medications   FLUoxetine (PROZAC) 20 MG tablet   Cervical disc disorder with radiculopathy of cervical region    Encouraged follow up with Dr. Trenton Gammon regarding decision about PT versus surgical intervention for radiculopathy.      Constipation - Primary    Chronic constipation and IBS. Symptoms  worsened with increased stressors. Will add Fluoxetine. Continue prn milk of magnesia. Follow up in 4 weeks and prn.      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 Discussed that this will likely need biopsied.          Return in about 4 weeks (around 03/07/2015) for Recheck.

## 2015-02-07 NOTE — Assessment & Plan Note (Signed)
Encouraged follow up with dermatology for new lesion at margin of previous scar. Discussed that this will likely need biopsied.

## 2015-02-28 ENCOUNTER — Other Ambulatory Visit: Payer: Self-pay | Admitting: Internal Medicine

## 2015-03-08 ENCOUNTER — Encounter: Payer: Self-pay | Admitting: Internal Medicine

## 2015-03-08 ENCOUNTER — Ambulatory Visit (INDEPENDENT_AMBULATORY_CARE_PROVIDER_SITE_OTHER): Payer: Commercial Managed Care - HMO | Admitting: Internal Medicine

## 2015-03-08 VITALS — BP 135/70 | HR 60 | Temp 98.1°F | Ht 63.0 in | Wt 157.0 lb

## 2015-03-08 DIAGNOSIS — M5010 Cervical disc disorder with radiculopathy, unspecified cervical region: Secondary | ICD-10-CM | POA: Diagnosis not present

## 2015-03-08 DIAGNOSIS — F4323 Adjustment disorder with mixed anxiety and depressed mood: Secondary | ICD-10-CM

## 2015-03-08 DIAGNOSIS — K5900 Constipation, unspecified: Secondary | ICD-10-CM | POA: Diagnosis not present

## 2015-03-08 MED ORDER — FLUOXETINE HCL 20 MG PO TABS: 20.0000 mg | ORAL_TABLET | Freq: Every day | ORAL | Status: DC

## 2015-03-08 NOTE — Progress Notes (Signed)
Subjective:    Patient ID: Susan Mcmillan, female    DOB: 1946-03-30, 69 y.o.   MRN: [id number]  HPI  69YO female presents for follow up.  Last seen 9/27 for anxiety and depression. Started on Fluoxetine. Symptoms of anxiety and depression improved. Taking only 10mg  dose, because medication was so expensive.  Continues to have some constipation. Taking stool softener at times with improvement. Having a BM irregularly. Some foods exacerbate symptoms.  She continues to have neck pain. Waiting on follow up from neurosurgery regarding potential surgical intervention.  Wt Readings from Last 3 Encounters:  03/08/15 157 lb (71.215 kg)  02/07/15 156 lb 8 oz (70.988 kg)  01/26/15 156 lb 14.4 oz (71.169 kg)   BP Readings from Last 3 Encounters:  03/08/15 135/70  02/07/15 125/80  01/26/15 138/84    Past Medical History  Diagnosis Date  . IBS (irritable bowel syndrome)   . Hypertension   . Pernicious anemia   . 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 Skin cancer   . Hyperlipemia   . 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 Vulvar pain   . Urethral caruncle   . Vulvar leukoplakia   . Dysuria    Family History  Problem Relation Age of Onset  . Hyperlipidemia Sister   . Hypertension Sister   . Macular degeneration Sister   . Diabetes Neg Hx   . Heart disease Neg Hx   . Cancer Neg Hx    Past Surgical History  Procedure Laterality Date  . Cholecystectomy    . Appendectomy    . Abdominal hernia repair  2014    Dr. Rochel Brome  . Cervical biopsy      Dr. Enzo Bi  . Abdominal hysterectomy  1978  . Wide local excision  07/2013    vin 3 w/ margins involoved   Social History   Social History  . Marital Status: Married    Spouse Name: N/A  . Number of Children: N/A  . Years of Education: N/A   Social History Main Topics  . Smoking status: Current Every Day Smoker --  0.50 packs/day    Types: Cigarettes, E-cigarettes  . Smokeless tobacco: None  . Alcohol Use: 0.0 oz/week    0 Standard drinks or equivalent per week     Comment: occasion  . Drug Use: No  . Sexual Activity: Not Currently   Other Topics Concern  . None   Social History Narrative   Lives in [place]. Previously worked [employer]. Husband with dementia. Children 2 sons.    Review of Systems  Constitutional: Negative for fever, chills, appetite change, fatigue and unexpected weight change.  Eyes: Negative for visual disturbance.  Respiratory: Negative for shortness of breath.   Cardiovascular: Negative for chest pain and leg swelling.  Gastrointestinal: Positive for constipation. Negative for nausea, vomiting, abdominal pain and diarrhea.  Musculoskeletal: Positive for myalgias, arthralgias and neck pain.  Skin: Negative for color change and rash.  Hematological: Negative for adenopathy. Does not bruise/bleed easily.  Psychiatric/Behavioral: Negative for sleep disturbance and dysphoric mood. The patient is not nervous/anxious.        Objective:    BP 135/70 mmHg  Pulse 60  Temp(Src) 98.1 F (36.7 C) (Oral)  Ht 5\' 3"  (1.6 m)  Wt 157 lb (71.215 kg)  BMI 27.82 kg/m2  SpO2 100% Physical Exam  Constitutional: She  is oriented to person, place, and time. She appears well-developed and well-nourished. No distress.  HENT:  Head: Normocephalic and atraumatic.  Right Ear: External ear normal.  Left Ear: External ear normal.  Nose: Nose normal.  Mouth/Throat: Oropharynx is clear and moist. No oropharyngeal exudate.  Eyes: Conjunctivae are normal. Pupils are equal, round, and reactive to light. Right eye exhibits no discharge. Left eye exhibits no discharge. No scleral icterus.  Neck: Normal range of motion. Neck supple. No tracheal deviation present. No thyromegaly present.  Cardiovascular: Normal rate, regular rhythm, normal heart sounds and intact distal pulses.  Exam reveals no  gallop and no friction rub.   No murmur heard. Pulmonary/Chest: Effort normal and breath sounds normal. No respiratory distress. She has no wheezes. She has no rales. She exhibits no tenderness.  Abdominal: Soft. Bowel sounds are normal. She exhibits no distension and no mass. There is no tenderness. There is no rebound and no guarding.  Musculoskeletal: Normal range of motion. She exhibits no edema or tenderness.  Lymphadenopathy:    She has no cervical adenopathy.  Neurological: She is alert and oriented to person, place, and time. No cranial nerve deficit. She exhibits normal muscle tone. Coordination normal.  Skin: Skin is warm and dry. No rash noted. She is not diaphoretic. No erythema. No pallor.  Psychiatric: She has a normal mood and affect. Her behavior is normal. Judgment and thought content normal.          Assessment & Plan:   Problem List Items Addressed This Visit      Unprioritized   Adjustment disorder with mixed anxiety and depressed mood - Primary    Symptoms improved with Fluoxetine. Encouraged her to look at other pharmacies, as this medication should not be this expensive. Follow up 3 months and prn.      Relevant Medications   FLUoxetine (PROZAC) 20 MG tablet   Cervical disc disorder with radiculopathy of cervical region    Encouraged her to follow up with Dr. Trenton Gammon regarding potential surgical intervention.      Constipation    Symptoms persistent. Likely related to IBS. Recommended adding daily Metamucil. Follow up in 3 months and prn.          Return in about 3 months (around 06/08/2015) for Recheck.

## 2015-03-08 NOTE — Assessment & Plan Note (Signed)
Symptoms persistent. Likely related to IBS. Recommended adding daily Metamucil. Follow up in 3 months and prn.

## 2015-03-08 NOTE — Assessment & Plan Note (Signed)
Symptoms improved with Fluoxetine. Encouraged her to look at other pharmacies, as this medication should not be this expensive. Follow up 3 months and prn.

## 2015-03-08 NOTE — Assessment & Plan Note (Signed)
Encouraged her to follow up with Dr. Trenton Gammon regarding potential surgical intervention.

## 2015-03-08 NOTE — Progress Notes (Signed)
Pre visit review using our clinic review tool, if applicable. No additional management support is needed unless otherwise documented below in the visit note. 

## 2015-03-08 NOTE — Patient Instructions (Addendum)
Continue current medications    Follow up in 3 months

## 2015-03-20 ENCOUNTER — Telehealth: Payer: Self-pay | Admitting: Internal Medicine

## 2015-03-20 NOTE — Telephone Encounter (Signed)
Okay to send Rx for Capsules?

## 2015-03-20 NOTE — Telephone Encounter (Signed)
Pt came in and dropped off a letter stating how much the Floxetine tables are. She said that the caplets are cheaper.. Placed in Dr Derry Skill boxes.. Please advise pt with any questions

## 2015-03-20 NOTE — Telephone Encounter (Signed)
Fine to send Rx for capsules.

## 2015-03-21 ENCOUNTER — Other Ambulatory Visit: Payer: Self-pay | Admitting: *Deleted

## 2015-03-21 MED ORDER — FLUOXETINE HCL 20 MG PO CAPS: 20.0000 mg | ORAL_CAPSULE | Freq: Every day | ORAL | Status: DC

## 2015-03-21 NOTE — Telephone Encounter (Signed)
Rx sent to pharmacy   

## 2015-03-24 DIAGNOSIS — M5416 Radiculopathy, lumbar region: Secondary | ICD-10-CM | POA: Insufficient documentation

## 2015-05-16 ENCOUNTER — Ambulatory Visit: Payer: Medicare HMO | Admitting: Internal Medicine

## 2015-05-17 ENCOUNTER — Ambulatory Visit (INDEPENDENT_AMBULATORY_CARE_PROVIDER_SITE_OTHER): Payer: Commercial Managed Care - HMO | Admitting: Internal Medicine

## 2015-05-17 ENCOUNTER — Encounter: Payer: Self-pay | Admitting: Internal Medicine

## 2015-05-17 VITALS — BP 121/78 | HR 53 | Temp 97.8°F | Ht 62.5 in | Wt 152.5 lb

## 2015-05-17 DIAGNOSIS — Z0000 Encounter for general adult medical examination without abnormal findings: Secondary | ICD-10-CM | POA: Insufficient documentation

## 2015-05-17 DIAGNOSIS — Z1239 Encounter for other screening for malignant neoplasm of breast: Secondary | ICD-10-CM

## 2015-05-17 LAB — COMPREHENSIVE METABOLIC PANEL
ALBUMIN: 4.2 g/dL (ref 3.5–5.2)
ALK PHOS: 75 U/L (ref 39–117)
ALT: 17 U/L (ref 0–35)
AST: 18 U/L (ref 0–37)
BILIRUBIN TOTAL: 0.3 mg/dL (ref 0.2–1.2)
BUN: 13 mg/dL (ref 6–23)
CO2: 31 mEq/L (ref 19–32)
Calcium: 9.8 mg/dL (ref 8.4–10.5)
Chloride: 101 mEq/L (ref 96–112)
Creatinine, Ser: 0.81 mg/dL (ref 0.40–1.20)
GFR: 74.49 mL/min (ref >60.00)
Glucose, Bld: 80 mg/dL (ref 70–99)
POTASSIUM: 3.8 meq/L (ref 3.5–5.1)
Sodium: 138 mEq/L (ref 135–145)
TOTAL PROTEIN: 7 g/dL (ref 6.0–8.3)

## 2015-05-17 LAB — CBC WITH DIFFERENTIAL/PLATELET
BASOS ABS: 0 10*3/uL (ref 0.0–0.1)
Basophils Relative: 0.4 % (ref 0.0–3.0)
EOS PCT: 0.7 % (ref 0.0–5.0)
Eosinophils Absolute: 0.1 10*3/uL (ref 0.0–0.7)
HEMATOCRIT: 43.5 % (ref 36.0–46.0)
HEMOGLOBIN: 14.5 g/dL (ref 12.0–15.0)
LYMPHS PCT: 15.1 % (ref 12.0–46.0)
Lymphs Abs: 1.4 10*3/uL (ref 0.7–4.0)
MCHC: 33.3 g/dL (ref 30.0–36.0)
MCV: 95.3 fl (ref 78.0–100.0)
MONOS PCT: 6.9 % (ref 3.0–12.0)
Monocytes Absolute: 0.6 10*3/uL (ref 0.1–1.0)
Neutro Abs: 7 10*3/uL (ref 1.4–7.7)
Neutrophils Relative %: 76.9 % (ref 43.0–77.0)
Platelets: 314 10*3/uL (ref 150.0–400.0)
RBC: 4.57 Mil/uL (ref 3.87–5.11)
RDW: 13.5 % (ref 11.5–15.5)
WBC: 9.1 10*3/uL (ref 4.0–10.5)

## 2015-05-17 LAB — LIPID PANEL
CHOLESTEROL: 172 mg/dL (ref 0–200)
HDL: 47.7 mg/dL (ref >39.00)
NonHDL: 124.36
Total CHOL/HDL Ratio: 4
Triglycerides: 288 mg/dL — ABNORMAL HIGH (ref 0.0–149.0)
VLDL: 57.6 mg/dL — AB (ref 0.0–40.0)

## 2015-05-17 LAB — MICROALBUMIN / CREATININE URINE RATIO
CREATININE, U: 27.2 mg/dL
Microalb Creat Ratio: 2.6 mg/g (ref 0.0–30.0)

## 2015-05-17 LAB — LDL CHOLESTEROL, DIRECT: LDL DIRECT: 88 mg/dL

## 2015-05-17 NOTE — Assessment & Plan Note (Signed)
Appropriate screening performed. Mammogram ordered and scheduled. Immunizations are UTD. Colonoscopy UTD. Labs ordered. Encouraged healthy diet and exercise. Encouraged smoking cessation.

## 2015-05-17 NOTE — Progress Notes (Signed)
Subjective:    Patient ID: Susan Mcmillan, female    DOB: 05-10-1946, 70 y.o.   MRN: [id number]  HPI  The patient is here for annual Medicare wellness examination and management of other chronic and acute problems.   The risk factors are reflected in the social history.  The roster of all physicians providing medical care to patient - is listed in the Snapshot section of the chart.  Activities of daily living:  The patient is 100% independent in all ADLs: dressing, toileting, feeding as well as independent mobility. Lives with husband. 7 cats and 1 dog.  Home safety : The patient has smoke detectors in the home. Has security system. They wear seatbelts.  There are locked firearms at home. There is no violence in the home.   There is no risks for hepatitis, STDs or HIV. There is no history of blood transfusion. They have no travel history to infectious disease endemic areas of the world.  The patient has seen their dentist in the last six month. [facility] They have seen their eye doctor in the last year. Opthalmology - [facility] No issue with hearing. They have deferred audiologic testing in the last year.   They do not  have excessive sun exposure. Discussed the need for sun protection: hats, long sleeves and use of sunscreen if there is significant sun exposure. Derm - was Dr. Koleen Nimrod, no follow up scheduled  Diet: the importance of a healthy diet is discussed. They do have a healthy diet.  The benefits of regular aerobic exercise were discussed. She walks 2x per week. Limited by recent surgery.  Trying to cut back on smoking. Using e-cig.  Depression screen: there are no signs or vegative symptoms of depression- irritability, change in appetite, anhedonia, sadness/tearfullness. Symptoms of depression much improved with Fluoxetine.  Cognitive assessment: the patient manages all their financial and personal affairs and is actively engaged. They could relate  day,date,year and events.  Susan Mcmillan and Susan Mcmillan.  The following portions of the patient's history were reviewed and updated as appropriate: allergies, current medications, past family history, past medical history,  past surgical history, past social history  and problem list.  Visual acuity was not assessed per patient preference since she has regular follow up with her ophthalmologist. Hearing and body mass index were assessed and reviewed.   During the course of the visit the patient was educated and counseled about appropriate screening and preventive services including : fall prevention , diabetes screening, nutrition counseling, colorectal cancer screening, and recommended immunizations.      Wt Readings from Last 3 Encounters:  05/17/15 152 lb 8 oz (69.174 kg)  03/08/15 157 lb (71.215 kg)  02/07/15 156 lb 8 oz (70.988 kg)   BP Readings from Last 3 Encounters:  05/17/15 121/78  03/08/15 135/70  02/07/15 125/80    Past Medical History  Diagnosis Date  . IBS (irritable bowel syndrome)   . Hypertension   . Pernicious anemia   . 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 Skin cancer   . Hyperlipemia   . 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 Vulvar pain   . Urethral caruncle   . Vulvar leukoplakia   . Dysuria    Family History  Problem Relation Age of Onset  . Hyperlipidemia Sister   . Hypertension  Sister   . Macular degeneration Sister   . Diabetes Neg Hx   . Heart disease Neg Hx   . Cancer Neg Hx    Past Surgical History  Procedure Laterality Date  . Cholecystectomy    . Appendectomy    . Abdominal hernia repair  2014    Dr. Rochel Brome  . Cervical biopsy      Dr. Enzo Bi  . Abdominal hysterectomy  1978  . Wide local excision  07/2013    vin 3 w/ margins involoved   Social History   Social History  . Marital Status: Married     Spouse Name: N/A  . Number of Children: N/A  . Years of Education: N/A   Social History Main Topics  . Smoking status: Current Every Day Smoker -- 0.50 packs/day    Types: Cigarettes, E-cigarettes  . Smokeless tobacco: None  . Alcohol Use: 0.0 oz/week    0 Standard drinks or equivalent per week     Comment: occasion  . Drug Use: No  . Sexual Activity: Not Currently   Other Topics Concern  . None   Social History Narrative   Lives in [place]. Previously worked [employer]. Husband with dementia. Children 2 sons.    Review of Systems  Constitutional: Negative for fever, chills, appetite change, fatigue and unexpected weight change.  Eyes: Negative for visual disturbance.  Respiratory: Negative for cough and shortness of breath.   Cardiovascular: Negative for chest pain and leg swelling.  Gastrointestinal: Negative for nausea, vomiting, abdominal pain, diarrhea and constipation.  Musculoskeletal: Negative for myalgias and arthralgias.  Skin: Negative for color change and rash.  Hematological: Negative for adenopathy. Does not bruise/bleed easily.  Psychiatric/Behavioral: Negative for sleep disturbance and dysphoric mood. The patient is not nervous/anxious.        Objective:    BP 121/78 mmHg  Pulse 53  Temp(Src) 97.8 F (36.6 C) (Oral)  Ht 5' 2.5" (1.588 m)  Wt 152 lb 8 oz (69.174 kg)  BMI 27.43 kg/m2  SpO2 99% Physical Exam  Constitutional: She is oriented to person, place, and time. She appears well-developed and well-nourished. No distress.  HENT:  Head: Normocephalic and atraumatic.  Right Ear: External ear normal.  Left Ear: External ear normal.  Nose: Nose normal.  Mouth/Throat: Oropharynx is clear and moist. No oropharyngeal exudate.  Eyes: Conjunctivae are normal. Pupils are equal, round, and reactive to light. Right eye exhibits no discharge. Left eye exhibits no discharge. No scleral icterus.  Neck: Normal range of motion. Neck supple. No tracheal  deviation present. No thyromegaly present.  Cardiovascular: Normal rate, regular rhythm, normal heart sounds and intact distal pulses.  Exam reveals no gallop and no friction rub.   No murmur heard. Pulmonary/Chest: Effort normal and breath sounds normal. No accessory muscle usage. No tachypnea. No respiratory distress. She has no decreased breath sounds. She has no wheezes. She has no rales. She exhibits no tenderness. Right breast exhibits no inverted nipple, no mass, no nipple discharge, no skin change and no tenderness. Left breast exhibits no inverted nipple, no mass, no nipple discharge, no skin change and no tenderness. Breasts are symmetrical.  Abdominal: Soft. Bowel sounds are normal. She exhibits no distension and no mass. There is no tenderness. There is no rebound and no guarding.  Musculoskeletal: Normal range of motion. She exhibits no edema or tenderness.  Lymphadenopathy:    She has no cervical adenopathy.  Neurological: She is alert and oriented to person, place, and time.  No cranial nerve deficit. She exhibits normal muscle tone. Coordination normal.  Skin: Skin is warm and dry. No rash noted. She is not diaphoretic. No erythema. No pallor.  Psychiatric: She has a normal mood and affect. Her behavior is normal. Judgment and thought content normal.          Assessment & Plan:   Problem List Items Addressed This Visit      Unprioritized   Medicare annual wellness visit, subsequent - Primary    Appropriate screening performed. Mammogram ordered and scheduled. Immunizations are UTD. Colonoscopy UTD. Labs ordered. Encouraged healthy diet and exercise. Encouraged smoking cessation.      Relevant Orders   CBC with Differential/Platelet   Comprehensive metabolic panel   Lipid panel   Microalbumin / creatinine urine ratio   Routine general medical examination at a health care facility    General physical exam normal today including breast exam. PAP and pelvic deferred given  age and preference.       Screening for breast cancer   Relevant Orders   MM Digital Screening       Return in about 6 months (around 11/14/2015) for Recheck.

## 2015-05-17 NOTE — Patient Instructions (Signed)
Health Maintenance, Female Adopting a healthy lifestyle and getting preventive care can go a long way to promote health and wellness. Talk with your health care provider about what schedule of regular examinations is right for you. This is a good chance for you to check in with your provider about disease prevention and staying healthy. In between checkups, there are plenty of things you can do on your own. Experts have done a lot of research about which lifestyle changes and preventive measures are most likely to keep you healthy. Ask your health care provider for more information. WEIGHT AND DIET  Eat a healthy diet  Be sure to include plenty of vegetables, fruits, low-fat dairy products, and lean protein.  Do not eat a lot of foods high in solid fats, added sugars, or salt.  Get regular exercise. This is one of the most important things you can do for your health.  Most adults should exercise for at least 150 minutes each week. The exercise should increase your heart rate and make you sweat (moderate-intensity exercise).  Most adults should also do strengthening exercises at least twice a week. This is in addition to the moderate-intensity exercise.  Maintain a healthy weight  Body mass index (BMI) is a measurement that can be used to identify possible weight problems. It estimates body fat based on height and weight. Your health care provider can help determine your BMI and help you achieve or maintain a healthy weight.  For females 18 years of age and older:   A BMI below 18.5 is considered underweight.  A BMI of 18.5 to 24.9 is normal.  A BMI of 25 to 29.9 is considered overweight.  A BMI of 30 and above is considered obese.  Watch levels of cholesterol and blood lipids  You should start having your blood tested for lipids and cholesterol at 70 years of age, then have this test every 5 years.  You may need to have your cholesterol levels checked more often if:  Your lipid  or cholesterol levels are high.  You are older than 70 years of age.  You are at high risk for heart disease.  CANCER SCREENING   Lung Cancer  Lung cancer screening is recommended for adults 32-83 years old who are at high risk for lung cancer because of a history of smoking.  A yearly low-dose CT scan of the lungs is recommended for people who:  Currently smoke.  Have quit within the past 15 years.  Have at least a 30-pack-year history of smoking. A pack year is smoking an average of one pack of cigarettes a day for 1 year.  Yearly screening should continue until it has been 15 years since you quit.  Yearly screening should stop if you develop a health problem that would prevent you from having lung cancer treatment.  Breast Cancer  Practice breast self-awareness. This means understanding how your breasts normally appear and feel.  It also means doing regular breast self-exams. Let your health care provider know about any changes, no matter how small.  If you are in your 20s or 30s, you should have a clinical breast exam (CBE) by a health care provider every 1-3 years as part of a regular health exam.  If you are 81 or older, have a CBE every year. Also consider having a breast X-ray (mammogram) every year.  If you have a family history of breast cancer, talk to your health care provider about genetic screening.  If you  are at high risk for breast cancer, talk to your health care provider about having an MRI and a mammogram every year.  Breast cancer gene (BRCA) assessment is recommended for women who have family members with BRCA-related cancers. BRCA-related cancers include:  Breast.  Ovarian.  Tubal.  Peritoneal cancers.  Results of the assessment will determine the need for genetic counseling and BRCA1 and BRCA2 testing. Cervical Cancer Your health care provider may recommend that you be screened regularly for cancer of the pelvic organs (ovaries, uterus, and  vagina). This screening involves a pelvic examination, including checking for microscopic changes to the surface of your cervix (Pap test). You may be encouraged to have this screening done every 3 years, beginning at age 34.  For women ages 68-65, health care providers may recommend pelvic exams and Pap testing every 3 years, or they may recommend the Pap and pelvic exam, combined with testing for human papilloma virus (HPV), every 5 years. Some types of HPV increase your risk of cervical cancer. Testing for HPV may also be done on women of any age with unclear Pap test results.  Other health care providers may not recommend any screening for nonpregnant women who are considered low risk for pelvic cancer and who do not have symptoms. Ask your health care provider if a screening pelvic exam is right for you.  If you have had past treatment for cervical cancer or a condition that could lead to cancer, you need Pap tests and screening for cancer for at least 20 years after your treatment. If Pap tests have been discontinued, your risk factors (such as having a new sexual partner) need to be reassessed to determine if screening should resume. Some women have medical problems that increase the chance of getting cervical cancer. In these cases, your health care provider may recommend more frequent screening and Pap tests. Colorectal Cancer  This type of cancer can be detected and often prevented.  Routine colorectal cancer screening usually begins at 70 years of age and continues through 70 years of age.  Your health care provider may recommend screening at an earlier age if you have risk factors for colon cancer.  Your health care provider may also recommend using home test kits to check for hidden blood in the stool.  A small camera at the end of a tube can be used to examine your colon directly (sigmoidoscopy or colonoscopy). This is done to check for the earliest forms of colorectal  cancer.  Routine screening usually begins at age 36.  Direct examination of the colon should be repeated every 5-10 years through 70 years of age. However, you may need to be screened more often if early forms of precancerous polyps or small growths are found. Skin Cancer  Check your skin from head to toe regularly.  Tell your health care provider about any new moles or changes in moles, especially if there is a change in a mole's shape or color.  Also tell your health care provider if you have a mole that is larger than the size of a pencil eraser.  Always use sunscreen. Apply sunscreen liberally and repeatedly throughout the day.  Protect yourself by wearing long sleeves, pants, a wide-brimmed hat, and sunglasses whenever you are outside. HEART DISEASE, DIABETES, AND HIGH BLOOD PRESSURE   High blood pressure causes heart disease and increases the risk of stroke. High blood pressure is more likely to develop in:  People who have blood pressure in the high end  of the normal range (130-139/85-89 mm Hg).  People who are overweight or obese.  People who are African American.  If you are 38-23 years of age, have your blood pressure checked every 3-5 years. If you are 61 years of age or older, have your blood pressure checked every year. You should have your blood pressure measured twice--once when you are at a hospital or clinic, and once when you are not at a hospital or clinic. Record the average of the two measurements. To check your blood pressure when you are not at a hospital or clinic, you can use:  An automated blood pressure machine at a pharmacy.  A home blood pressure monitor.  If you are between 45 years and 39 years old, ask your health care provider if you should take aspirin to prevent strokes.  Have regular diabetes screenings. This involves taking a blood sample to check your fasting blood sugar level.  If you are at a normal weight and have a low risk for diabetes,  have this test once every three years after 70 years of age.  If you are overweight and have a high risk for diabetes, consider being tested at a younger age or more often. PREVENTING INFECTION  Hepatitis B  If you have a higher risk for hepatitis B, you should be screened for this virus. You are considered at high risk for hepatitis B if:  You were born in a country where hepatitis B is common. Ask your health care provider which countries are considered high risk.  Your parents were born in a high-risk country, and you have not been immunized against hepatitis B (hepatitis B vaccine).  You have HIV or AIDS.  You use needles to inject street drugs.  You live with someone who has hepatitis B.  You have had sex with someone who has hepatitis B.  You get hemodialysis treatment.  You take certain medicines for conditions, including cancer, organ transplantation, and autoimmune conditions. Hepatitis C  Blood testing is recommended for:  Everyone born from [birth year] through 1965.  Anyone with known risk factors for hepatitis C. Sexually transmitted infections (STIs)  You should be screened for sexually transmitted infections (STIs) including gonorrhea and chlamydia if:  You are sexually active and are younger than 70 years of age.  You are older than 70 years of age and your health care provider tells you that you are at risk for this type of infection.  Your sexual activity has changed since you were last screened and you are at an increased risk for chlamydia or gonorrhea. Ask your health care provider if you are at risk.  If you do not have HIV, but are at risk, it may be recommended that you take a prescription medicine daily to prevent HIV infection. This is called pre-exposure prophylaxis (PrEP). You are considered at risk if:  You are sexually active and do not regularly use condoms or know the HIV status of your partner(s).  You take drugs by injection.  You are sexually  active with a partner who has HIV. Talk with your health care provider about whether you are at high risk of being infected with HIV. If you choose to begin PrEP, you should first be tested for HIV. You should then be tested every 3 months for as long as you are taking PrEP.  PREGNANCY   If you are premenopausal and you may become pregnant, ask your health care provider about preconception counseling.  If you may  become pregnant, take 400 to 800 micrograms (mcg) of folic acid every day.  If you want to prevent pregnancy, talk to your health care provider about birth control (contraception). OSTEOPOROSIS AND MENOPAUSE   Osteoporosis is a disease in which the bones lose minerals and strength with aging. This can result in serious bone fractures. Your risk for osteoporosis can be identified using a bone density scan.  If you are 61 years of age or older, or if you are at risk for osteoporosis and fractures, ask your health care provider if you should be screened.  Ask your health care provider whether you should take a calcium or vitamin D supplement to lower your risk for osteoporosis.  Menopause may have certain physical symptoms and risks.  Hormone replacement therapy may reduce some of these symptoms and risks. Talk to your health care provider about whether hormone replacement therapy is right for you.  HOME CARE INSTRUCTIONS   Schedule regular health, dental, and eye exams.  Stay current with your immunizations.   Do not use any tobacco products including cigarettes, chewing tobacco, or electronic cigarettes.  If you are pregnant, do not drink alcohol.  If you are breastfeeding, limit how much and how often you drink alcohol.  Limit alcohol intake to no more than 1 drink per day for nonpregnant women. One drink equals 12 ounces of beer, 5 ounces of wine, or 1 ounces of hard liquor.  Do not use street drugs.  Do not share needles.  Ask your health care provider for help if  you need support or information about quitting drugs.  Tell your health care provider if you often feel depressed.  Tell your health care provider if you have ever been abused or do not feel safe at home.   This information is not intended to replace advice given to you by your health care provider. Make sure you discuss any questions you have with your health care provider.   Document Released: 11/12/2010 Document Revised: 05/20/2014 Document Reviewed: 03/31/2013 Elsevier Interactive Patient Education [employer].

## 2015-05-17 NOTE — Assessment & Plan Note (Addendum)
General physical exam normal today including breast exam. PAP and pelvic deferred given age and preference.

## 2015-05-17 NOTE — Progress Notes (Signed)
Pre visit review using our clinic review tool, if applicable. No additional management support is needed unless otherwise documented below in the visit note. 

## 2015-05-22 ENCOUNTER — Telehealth: Payer: Self-pay | Admitting: Internal Medicine

## 2015-05-22 MED ORDER — CYANOCOBALAMIN 1000 MCG/ML IJ SOLN: INTRAMUSCULAR | Status: DC

## 2015-05-22 NOTE — Telephone Encounter (Signed)
Pt pharmacy is going to change to [facility] on [address] and [address]. As of 05/22/2015. Pt states on B12 order 16ml on renewal. Thank You!

## 2015-05-31 ENCOUNTER — Ambulatory Visit
Admission: RE | Admit: 2015-05-31 | Discharge: 2015-05-31 | Disposition: A | Discharge status: Home / Self Care | Payer: Commercial Managed Care - HMO | Source: Ambulatory Visit | Attending: Internal Medicine | Admitting: Internal Medicine

## 2015-05-31 DIAGNOSIS — Z1231 Encounter for screening mammogram for malignant neoplasm of breast: Secondary | ICD-10-CM | POA: Diagnosis present

## 2015-05-31 DIAGNOSIS — Z1239 Encounter for other screening for malignant neoplasm of breast: Secondary | ICD-10-CM

## 2015-06-01 ENCOUNTER — Encounter: Payer: Self-pay | Admitting: *Deleted

## 2015-06-20 ENCOUNTER — Other Ambulatory Visit: Payer: Self-pay | Admitting: Internal Medicine

## 2015-06-23 ENCOUNTER — Telehealth: Payer: Self-pay | Admitting: Internal Medicine

## 2015-06-23 NOTE — Telephone Encounter (Signed)
Noted  

## 2015-06-23 NOTE — Telephone Encounter (Signed)
Pt called about wanting to remove pharmacy  [facility], [place] and [id number] - [place], [place] - [address]. Refills go to [facility] [id number] - [place], [place]. Call pt @ [phone]. Thank you!

## 2015-06-26 ENCOUNTER — Telehealth: Payer: Self-pay | Admitting: *Deleted

## 2015-06-26 NOTE — Telephone Encounter (Signed)
Notified patient that annual lung cancer screening low dose CT scan is due. Confirmed that patient is within the age range of 55-77, and asymptomatic, (no signs or symptoms of lung cancer). The patient is a current smoker, with a 30.5 pack year history. The shared decision making visit was done 06/15/14 Patient is agreeable for CT scan being scheduled with insurance approval.

## 2015-07-10 ENCOUNTER — Other Ambulatory Visit: Payer: Self-pay | Admitting: Family Medicine

## 2015-07-10 ENCOUNTER — Encounter: Payer: Self-pay | Admitting: Family Medicine

## 2015-07-10 DIAGNOSIS — Z87891 Personal history of nicotine dependence: Secondary | ICD-10-CM

## 2015-07-10 HISTORY — DX: Personal history of nicotine dependence: Z87.891

## 2015-07-13 ENCOUNTER — Other Ambulatory Visit: Payer: Self-pay | Admitting: Internal Medicine

## 2015-07-14 ENCOUNTER — Telehealth: Payer: Self-pay | Admitting: *Deleted

## 2015-07-14 MED ORDER — METOPROLOL SUCCINATE ER 50 MG PO TB24: ORAL_TABLET | ORAL | Status: DC

## 2015-07-14 MED ORDER — TRIAMTERENE-HCTZ 37.5-25 MG PO TABS: ORAL_TABLET | ORAL | Status: DC

## 2015-07-14 NOTE — Telephone Encounter (Signed)
[facility] of choice . She requested metoprolol and triamterene be refilled and sent to [facility]. Pt contact [phone]

## 2015-07-14 NOTE — Telephone Encounter (Signed)
Refill request sent to [facility]

## 2015-07-24 ENCOUNTER — Ambulatory Visit
Admission: RE | Admit: 2015-07-24 | Discharge: 2015-07-24 | Disposition: A | Discharge status: Home / Self Care | Payer: Commercial Managed Care - HMO | Source: Ambulatory Visit | Attending: Family Medicine | Admitting: Family Medicine

## 2015-07-24 DIAGNOSIS — J438 Other emphysema: Secondary | ICD-10-CM | POA: Insufficient documentation

## 2015-07-24 DIAGNOSIS — D3501 Benign neoplasm of right adrenal gland: Secondary | ICD-10-CM | POA: Diagnosis not present

## 2015-07-24 DIAGNOSIS — I2510 Atherosclerotic heart disease of native coronary artery without angina pectoris: Secondary | ICD-10-CM | POA: Diagnosis not present

## 2015-07-24 DIAGNOSIS — I7090 Unspecified atherosclerosis: Secondary | ICD-10-CM | POA: Insufficient documentation

## 2015-07-24 DIAGNOSIS — Z87891 Personal history of nicotine dependence: Secondary | ICD-10-CM

## 2015-07-27 ENCOUNTER — Ambulatory Visit (INDEPENDENT_AMBULATORY_CARE_PROVIDER_SITE_OTHER): Payer: Commercial Managed Care - HMO | Admitting: Obstetrics and Gynecology

## 2015-07-27 ENCOUNTER — Encounter: Payer: Self-pay | Admitting: Obstetrics and Gynecology

## 2015-07-27 VITALS — BP 149/78 | HR 59 | Ht 63.0 in | Wt 152.2 lb

## 2015-07-27 DIAGNOSIS — D071 Carcinoma in situ of vulva: Secondary | ICD-10-CM

## 2015-07-27 NOTE — Patient Instructions (Signed)
1. Return for follow-up in 1 year for vulvar colposcopy

## 2015-07-27 NOTE — Progress Notes (Signed)
Chief complaint: 1. History of VIN 3, status post wide local excision with clear margins  Patient presents for vulvar colposcopy to assess for any interval changes. Last colposcopy was -6 months ago. Patient is not experiencing any vaginal irritation, itching, bleeding, or newly identified lesions.  OBJECTIVE: BP 149/78 mmHg  Pulse 59  Ht 5\' 3"  (1.6 m)  Wt 152 lb 3.2 oz (69.037 kg)  BMI 26.97 kg/m2 Pelvic exam: External genitalia-mild atrophy BUS-normal Vagina-atrophy  COLPOSCOPY: Vulva Indications: VIN 3, status post wide local excision with clear margins Findings: Normal-appearing introitus with minimal white scar at site of previous excision and posterior fourchette; no acetowhite epithelium; no abnormal vessels; no punctation  ASSESSMENT: History of VIN 3, status post wide local excision. Margins clear Normal colposcopy today  PLAN: 1. Return in 1 year for colposcopy unless symptoms of vaginal itching or irritation develop  Brayton Mars, MD  Note: This dictation was prepared with Dragon dictation along with smaller phrase technology. Any transcriptional errors that result from this process are unintentional.

## 2015-08-15 ENCOUNTER — Telehealth: Payer: Self-pay | Admitting: *Deleted

## 2015-08-15 DIAGNOSIS — I2510 Atherosclerotic heart disease of native coronary artery without angina pectoris: Secondary | ICD-10-CM

## 2015-08-15 NOTE — Telephone Encounter (Signed)
Sorry, Raquel Sarna. Meant to send that to my nurse

## 2015-08-15 NOTE — Telephone Encounter (Signed)
Notified patient of LDCT lung cancer screening results of Lung Rads 2S finding with recommendation for 12 month follow up imaging. Also notified of incidental finding noted below. Patient verbalizes understanding.   IMPRESSION: 1. Lung-RADS Category 2S, benign appearance or behavior. Continue annual screening with low-dose chest CT without contrast in 12 months. 2. The "S" modifier above refers to potentially clinically significant non lung cancer related findings. Specifically, there is atherosclerosis, including three-vessel coronary artery disease. Please note that although the presence of coronary artery calcium documents the presence of coronary artery disease, the severity of this disease and any potential stenosis cannot be assessed on this non-gated CT examination. Assessment for potential risk factor modification, dietary therapy or pharmacologic therapy may be warranted, if clinically indicated. 3. Mild diffuse bronchial wall thickening with mild paraseptal emphysema. ; imaging findings suggestive of underlying COPD. 4. Right adrenal adenoma again noted.

## 2015-08-15 NOTE — Telephone Encounter (Signed)
CT chest showed 3 vessel coronary artery disease. Can you ask patient if she is willing to see cardiology for further testing? This may include a stress test or coronary artery calcium scoring.

## 2015-08-16 NOTE — Telephone Encounter (Signed)
Pt is agreeable to seeing Cardiology, Pt states that it has to be a cardiologist in her insurance network.

## 2015-08-17 NOTE — Addendum Note (Signed)
Addended by: Ronette Deter A on: 08/17/2015 07:15 AM   Modules accepted: Orders

## 2015-09-16 ENCOUNTER — Other Ambulatory Visit: Payer: Self-pay | Admitting: Internal Medicine

## 2015-10-15 ENCOUNTER — Other Ambulatory Visit: Payer: Self-pay | Admitting: Internal Medicine

## 2015-11-20 ENCOUNTER — Encounter: Payer: Self-pay | Admitting: Internal Medicine

## 2015-11-20 ENCOUNTER — Ambulatory Visit (INDEPENDENT_AMBULATORY_CARE_PROVIDER_SITE_OTHER): Payer: Commercial Managed Care - HMO | Admitting: Internal Medicine

## 2015-11-20 VITALS — BP 128/77 | HR 63 | Ht 63.0 in | Wt 155.8 lb

## 2015-11-20 DIAGNOSIS — I2583 Coronary atherosclerosis due to lipid rich plaque: Secondary | ICD-10-CM

## 2015-11-20 DIAGNOSIS — E785 Hyperlipidemia, unspecified: Secondary | ICD-10-CM

## 2015-11-20 DIAGNOSIS — I10 Essential (primary) hypertension: Secondary | ICD-10-CM | POA: Diagnosis not present

## 2015-11-20 DIAGNOSIS — I2510 Atherosclerotic heart disease of native coronary artery without angina pectoris: Secondary | ICD-10-CM

## 2015-11-20 DIAGNOSIS — D0471 Carcinoma in situ of skin of right lower limb, including hip: Secondary | ICD-10-CM

## 2015-11-20 DIAGNOSIS — F4323 Adjustment disorder with mixed anxiety and depressed mood: Secondary | ICD-10-CM | POA: Diagnosis not present

## 2015-11-20 LAB — COMPREHENSIVE METABOLIC PANEL
ALBUMIN: 4 g/dL (ref 3.5–5.2)
ALK PHOS: 75 U/L (ref 39–117)
ALT: 15 U/L (ref 0–35)
AST: 17 U/L (ref 0–37)
BUN: 16 mg/dL (ref 6–23)
CALCIUM: 9.4 mg/dL (ref 8.4–10.5)
CHLORIDE: 104 meq/L (ref 96–112)
CO2: 31 mEq/L (ref 19–32)
Creatinine, Ser: 0.81 mg/dL (ref 0.40–1.20)
GFR: 74.38 mL/min (ref >60.00)
Glucose, Bld: 72 mg/dL (ref 70–99)
POTASSIUM: 3.4 meq/L — AB (ref 3.5–5.1)
SODIUM: 141 meq/L (ref 135–145)
TOTAL PROTEIN: 6.8 g/dL (ref 6.0–8.3)
Total Bilirubin: 0.4 mg/dL (ref 0.2–1.2)

## 2015-11-20 MED ORDER — FLUOXETINE HCL 20 MG PO CAPS: ORAL_CAPSULE | ORAL | Status: DC

## 2015-11-20 MED ORDER — ATORVASTATIN CALCIUM 20 MG PO TABS: 20.0000 mg | ORAL_TABLET | Freq: Every day | ORAL | Status: DC

## 2015-11-20 MED ORDER — METOPROLOL SUCCINATE ER 50 MG PO TB24: ORAL_TABLET | ORAL | Status: DC

## 2015-11-20 MED ORDER — TRIAMTERENE-HCTZ 37.5-25 MG PO TABS: ORAL_TABLET | ORAL | Status: DC

## 2015-11-20 NOTE — Assessment & Plan Note (Signed)
Referral placed to dermatology

## 2015-11-20 NOTE — Assessment & Plan Note (Signed)
BP Readings from Last 3 Encounters:  11/20/15 128/77  07/27/15 149/78  05/17/15 121/78   BP well controlled. Renal function with labs. Continue current medications.

## 2015-11-20 NOTE — Patient Instructions (Signed)
Labs today.  Follow up with new PCP at [facility].

## 2015-11-20 NOTE — Assessment & Plan Note (Signed)
Cardiology evaluation pending. CT chest showed 3v CAD. Risks including HTN, HL, Smoking. Currently asymptomatic.

## 2015-11-20 NOTE — Progress Notes (Signed)
Pre visit review using our clinic review tool, if applicable. No additional management support is needed unless otherwise documented below in the visit note. 

## 2015-11-20 NOTE — Assessment & Plan Note (Signed)
Will check LFTs with labs. Continue Atorvastatin. 

## 2015-11-20 NOTE — Assessment & Plan Note (Signed)
Symptoms well controlled. Continue Fluoxetine.

## 2015-11-20 NOTE — Progress Notes (Signed)
Subjective:    Patient ID: Susan Mcmillan, female    DOB: 15-Oct-1945, 70 y.o.   MRN: [id number]  HPI  70YO female presents for follow up.  Some recent stressors caring for sister and husband.  HTN - Compliant with medication. No CP, HA.  Went to Dermatologist in May and had "blue light" treatment. Needs referral placed for this.    Wt Readings from Last 3 Encounters:  11/20/15 155 lb 12.8 oz (70.67 kg)  07/27/15 152 lb 3.2 oz (69.037 kg)  07/24/15 148 lb (67.132 kg)   BP Readings from Last 3 Encounters:  11/20/15 128/77  07/27/15 149/78  05/17/15 121/78    Past Medical History  Diagnosis Date  . IBS (irritable bowel syndrome)   . Hypertension   . Pernicious anemia   . 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 Skin cancer   . Hyperlipemia   . 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 Vulvar pain   . Urethral caruncle   . Vulvar leukoplakia   . Dysuria   . Personal history of tobacco use, presenting hazards to health 07/10/2015   Family History  Problem Relation Age of Onset  . Hyperlipidemia Sister   . Hypertension Sister   . Macular degeneration Sister   . Diabetes Neg Hx   . Heart disease Neg Hx   . Cancer Neg Hx    Past Surgical History  Procedure Laterality Date  . Cholecystectomy    . Appendectomy    . Abdominal hernia repair  2014    Dr. Rochel Brome  . Cervical biopsy      Dr. Enzo Bi  . Abdominal hysterectomy  1978  . Wide local excision  07/2013    vin 3 w/ margins involoved   Social History   Social History  . Marital Status: Married    Spouse Name: N/A  . Number of Children: N/A  . Years of Education: N/A   Social History Main Topics  . Smoking status: Current Every Day Smoker -- 0.50 packs/day    Types: Cigarettes, E-cigarettes  . Smokeless tobacco: None  . Alcohol Use: 0.0 oz/week    0 Standard drinks or equivalent per week   Comment: occasion  . Drug Use: No  . Sexual Activity: Not Currently   Other Topics Concern  . None   Social History Narrative   Lives in [place]. Previously worked [employer]. Husband with dementia. Children 2 sons.    Review of Systems  Constitutional: Negative for fever, chills, appetite change, fatigue and unexpected weight change.  Eyes: Negative for visual disturbance.  Respiratory: Negative for shortness of breath.   Cardiovascular: Negative for chest pain and leg swelling.  Gastrointestinal: Negative for nausea, vomiting, abdominal pain, diarrhea and constipation.  Musculoskeletal: Negative for myalgias and arthralgias.  Skin: Negative for color change and rash.  Hematological: Negative for adenopathy. Does not bruise/bleed easily.  Psychiatric/Behavioral: Negative for suicidal ideas, sleep disturbance and dysphoric mood. The patient is not nervous/anxious.        Objective:    BP 128/77 mmHg  Pulse 63  Ht 5\' 3"  (1.6 m)  Wt 155 lb 12.8 oz (70.67 kg)  BMI 27.61 kg/m2  SpO2 98% Physical Exam  Constitutional: She is oriented to person, place, and time. She appears well-developed and well-nourished. No distress.  HENT:  Head: Normocephalic and atraumatic.  Right Ear: External ear  normal.  Left Ear: External ear normal.  Nose: Nose normal.  Mouth/Throat: Oropharynx is clear and moist. No oropharyngeal exudate.  Eyes: Conjunctivae are normal. Pupils are equal, round, and reactive to light. Right eye exhibits no discharge. Left eye exhibits no discharge. No scleral icterus.  Neck: Normal range of motion. Neck supple. No tracheal deviation present. No thyromegaly present.  Cardiovascular: Normal rate, regular rhythm, normal heart sounds and intact distal pulses.  Exam reveals no gallop and no friction rub.   No murmur heard. Pulmonary/Chest: Effort normal and breath sounds normal. No respiratory distress. She has no wheezes. She has no rales. She exhibits no tenderness.    Musculoskeletal: Normal range of motion. She exhibits no edema or tenderness.  Lymphadenopathy:    She has no cervical adenopathy.  Neurological: She is alert and oriented to person, place, and time. No cranial nerve deficit. She exhibits normal muscle tone. Coordination normal.  Skin: Skin is warm and dry. No rash noted. She is not diaphoretic. No erythema. No pallor.  Psychiatric: She has a normal mood and affect. Her behavior is normal. Judgment and thought content normal.          Assessment & Plan:   Problem List Items Addressed This Visit      Unprioritized   Adjustment disorder with mixed anxiety and depressed mood (Chronic)    Symptoms well controlled. Continue Fluoxetine.      Coronary artery disease due to lipid rich plaque (Chronic)    Cardiology evaluation pending. CT chest showed 3v CAD. Risks including HTN, HL, Smoking. Currently asymptomatic.      Relevant Medications   metoprolol succinate (TOPROL-XL) 50 MG 24 hr tablet   triamterene-hydrochlorothiazide (MAXZIDE-25) 37.5-25 MG tablet   atorvastatin (LIPITOR) 20 MG tablet   Hyperlipidemia (Chronic)    Will check LFTs with labs. Continue Atorvastatin.      Relevant Medications   metoprolol succinate (TOPROL-XL) 50 MG 24 hr tablet   triamterene-hydrochlorothiazide (MAXZIDE-25) 37.5-25 MG tablet   atorvastatin (LIPITOR) 20 MG tablet   Hypertension - Primary (Chronic)    BP Readings from Last 3 Encounters:  11/20/15 128/77  07/27/15 149/78  05/17/15 121/78   BP well controlled. Renal function with labs. Continue current medications.      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      Relevant Orders   Ambulatory  referral to Dermatology       Return if symptoms worsen or fail to improve.  Ronette Deter, MD [facility]

## 2015-11-21 ENCOUNTER — Ambulatory Visit (INDEPENDENT_AMBULATORY_CARE_PROVIDER_SITE_OTHER): Payer: Commercial Managed Care - HMO | Admitting: Cardiovascular Disease

## 2015-11-21 ENCOUNTER — Encounter: Payer: Self-pay | Admitting: Cardiovascular Disease

## 2015-11-21 VITALS — BP 134/87 | HR 59 | Ht 62.0 in | Wt 154.8 lb

## 2015-11-21 DIAGNOSIS — R0602 Shortness of breath: Secondary | ICD-10-CM | POA: Diagnosis not present

## 2015-11-21 DIAGNOSIS — I10 Essential (primary) hypertension: Secondary | ICD-10-CM | POA: Diagnosis not present

## 2015-11-21 DIAGNOSIS — I2583 Coronary atherosclerosis due to lipid rich plaque: Principal | ICD-10-CM

## 2015-11-21 DIAGNOSIS — I2510 Atherosclerotic heart disease of native coronary artery without angina pectoris: Secondary | ICD-10-CM | POA: Diagnosis not present

## 2015-11-21 NOTE — Progress Notes (Signed)
Cardiology Office Note   Date:  11/21/2015   ID:  Susan Mcmillan, DOB 1945-07-10, MRN [id number]  PCP:  Rica Mast, MD  Cardiologist:   Kathlyn Sacramento, MD   Chief Complaint  Patient presents with  . other    No complaints today. Meds reviewed verbally with pt.      History of Present Illness: Susan Mcmillan is a 70 y.o. female who Was referred by Dr. Gilford Rile for evaluation of coronary atherosclerosis noted on CT scan. She has no previous cardiac history but has multiple risk factors including hypertension, hyperlipidemia and tobacco use. She has been having CT scan of her lungs to follow-up on pulmonary nodule and was found to have three-vessel coronary atherosclerosis. She denies any chest pain. She has chronic exertional dyspnea. She complains of chronic back pain. No orthopnea, PND or significant leg edema. No claudication. Her blood pressure has been controlled and she takes atorvastatin for hyperlipidemia. No previous ischemic cardiac evaluation. She is able to do activities of daily living and her yard work.   Past Medical History  Diagnosis Date  . IBS (irritable bowel syndrome)   . Hypertension   . Pernicious anemia   . 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 Skin cancer   . Hyperlipemia   . 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 Vulvar pain   . Urethral caruncle   . Vulvar leukoplakia   . Dysuria   . Personal history of tobacco use, presenting hazards to health 07/10/2015  . History of kidney stones     Past Surgical History  Procedure Laterality Date  . Cholecystectomy    . Appendectomy    . Abdominal hernia repair  2014    Dr. Rochel Brome  . Cervical biopsy      Dr. Enzo Bi  . Abdominal hysterectomy  1978  . Wide local excision  07/2013    vin 3 w/ margins involoved     Current Outpatient Prescriptions  Medication Sig Dispense Refill    . ALPRAZolam (XANAX) 0.25 MG tablet Take 2 tablets (0.5 mg total) by mouth at bedtime as needed. (Patient taking differently: Take 0.25 mg by mouth at bedtime as needed. ) 60 tablet 0  . aspirin 81 MG tablet Take 81 mg by mouth daily.    Marland Kitchen atorvastatin (LIPITOR) 20 MG tablet Take 1 tablet (20 mg total) by mouth daily. 90 tablet 3  . cyanocobalamin (,VITAMIN B-12,) 1000 MCG/ML injection INJECT 1 ML INTO THE MUSCLE EVERY 30 (THIRTY) DAYS. 10 mL 1  . FLUoxetine (PROZAC) 20 MG capsule TAKE 1 CAPSULE BY MOUTH EVERY DAY 90 capsule 3  . folic acid (FOLVITE) A999333 MCG tablet Take 800 mcg by mouth daily.     Marland Kitchen gabapentin (NEURONTIN) 300 MG capsule Take 300 mg by mouth as needed.     . metoprolol succinate (TOPROL-XL) 50 MG 24 hr tablet TAKE 1 TABLET BY MOUTH DAILY WITH OR IMMEDIATELY FOLLOWING MEAL 90 tablet 3  . triamterene-hydrochlorothiazide (MAXZIDE-25) 37.5-25 MG tablet TAKE 1/2 TAB BY MOUTH DAILY 90 tablet 2   No current facility-administered medications for this visit.    Allergies:   Ace inhibitors; Codeine; Erythromycin; Iodinated diagnostic agents; Other; and Penicillins    Social History:  The patient  reports that she has been smoking Cigarettes and E-cigarettes.  She has a 27.5 pack-year smoking history. She does not  have any smokeless tobacco history on file. She reports that she drinks alcohol. She reports that she does not use illicit drugs.   Family History:  The patient's family history includes Hyperlipidemia in her sister; Hypertension in her sister; Macular degeneration in her sister. There is no history of Diabetes, Heart disease, or Cancer.    ROS:  Please see the history of present illness.   Otherwise, review of systems are positive for none.   All other systems are reviewed and negative.    PHYSICAL EXAM: VS:  BP 134/87 mmHg  Pulse 59  Ht 5\' 2"  (1.575 m)  Wt 154 lb 12 oz (70.194 kg)  BMI 28.30 kg/m2 , BMI Body mass index is 28.3 kg/(m^2). GEN: Well nourished, well  developed, in no acute distress HEENT: normal Neck: no JVD, carotid bruits, or masses Cardiac: RRR; no murmurs, rubs, or gallops,no edema  Respiratory:  clear to auscultation bilaterally, normal work of breathing GI: soft, nontender, nondistended, + BS MS: no deformity or atrophy Skin: warm and dry, no rash Neuro:  Strength and sensation are intact Psych: euthymic mood, full affect   EKG:  EKG is ordered today. The ekg ordered today demonstrates normal sinus rhythm with anterior T wave changes suggestive of ischemia.   Recent Labs: 05/17/2015: Hemoglobin 14.5; Platelets 314.0 11/20/2015: ALT 15; BUN 16; Creatinine, Ser 0.81; Potassium 3.4*; Sodium 141    Lipid Panel    Component Value Date/Time   CHOL 172 05/17/2015 1442   TRIG 288.0* 05/17/2015 1442   HDL 47.70 05/17/2015 1442   CHOLHDL 4 05/17/2015 1442   VLDL 57.6* 05/17/2015 1442   LDLCALC 70 07/05/2014 1414   LDLDIRECT 88.0 05/17/2015 1442      Wt Readings from Last 3 Encounters:  11/21/15 154 lb 12 oz (70.194 kg)  11/20/15 155 lb 12.8 oz (70.67 kg)  07/27/15 152 lb 3.2 oz (69.037 kg)       ASSESSMENT AND PLAN:  1.  Coronary artery disease involving native coronary arteries with exertional dyspnea: CT scan showed evidence of three-vessel coronary atherosclerosis. Her symptoms include exertional dyspnea without chest pain. EKG showed anterior T wave changes suggestive of ischemia. Due to that, I recommend further evaluation with a pharmacologic nuclear stress test to ensure no obstructive coronary artery disease. She is not able to exercise on a treadmill due to chronic back pain and has an abnormal baseline EKG. If stress test does not show evidence of ischemia, I recommend continuing treatment of risk factors.  2. Hyperlipidemia: Continue treatment with atorvastatin with a target LDL of less than 70.  3. Essential hypertension: Blood pressure is controlled on current medications.  4. Tobacco use: We discussed  briefly the importance of smoking cessation and she does not want to quit.   Disposition:   FU with me as needed.   Signed,  Kathlyn Sacramento, MD  11/21/2015 2:48 PM    [place]

## 2015-11-21 NOTE — Patient Instructions (Addendum)
Medication Instructions:  Your physician recommends that you continue on your current medications as directed. Please refer to the Current Medication list given to you today.   Labwork: none  Testing/Procedures: Your physician has requested that you have a lexiscan myoview. For further information please visit [email or web address]. Please follow instruction sheet, as given.  [place]  Your caregiver has ordered a Stress Test with nuclear imaging. The purpose of this test is to evaluate the blood supply to your heart muscle. This procedure is referred to as a "Non-Invasive Stress Test." This is because other than having an IV started in your vein, nothing is inserted or "invades" your body. Cardiac stress tests are done to find areas of poor blood flow to the heart by determining the extent of coronary artery disease (CAD). Some patients exercise on a treadmill, which naturally increases the blood flow to your heart, while others who are  unable to walk on a treadmill due to physical limitations have a pharmacologic/chemical stress agent called Lexiscan . This medicine will mimic walking on a treadmill by temporarily increasing your coronary blood flow.   Please note: these test may take anywhere between 2-4 hours to complete  PLEASE REPORT TO [facility] AT THE FIRST DESK WILL DIRECT YOU WHERE TO GO  Date of Procedure:__Thursday, July 20__  Arrival Time for Procedure: 8:15am  Instructions regarding medication:    _xx___:  Hold metoprolol night before procedure and morning of procedure    PLEASE NOTIFY THE OFFICE AT LEAST 24 HOURS IN ADVANCE IF YOU ARE UNABLE TO KEEP YOUR APPOINTMENT.  [phone] AND  PLEASE NOTIFY NUCLEAR MEDICINE AT [facility] AT LEAST 24 HOURS IN ADVANCE IF YOU ARE UNABLE TO KEEP YOUR APPOINTMENT. [phone]  How to prepare for your Myoview test:   Do not eat or drink after midnight  No caffeine for 24 hours prior to test  No  smoking 24 hours prior to test.  Your medication may be taken with water.  If your doctor stopped a medication because of this test, do not take that medication.  Ladies, please do not wear dresses.  Skirts or pants are appropriate. Please wear a short sleeve shirt.  No perfume, cologne or lotion.  Wear comfortable walking shoes. No heels!            Follow-Up: Your physician wants you to follow-up as needed. You will receive a reminder letter in the mail two months in advance. If you don't receive a letter, please call our office to schedule the follow-up appointment.   Any Other Special Instructions Will Be Listed Below (If Applicable).     If you need a refill on your cardiac medications before your next appointment, please call your pharmacy.  Cardiac Nuclear Scanning A cardiac nuclear scan is used to check your heart for problems, such as the following:  A portion of the heart is not getting enough blood.  Part of the heart muscle has died, which happens with a heart attack.  The heart wall is not working normally.  In this test, a radioactive dye (tracer) is injected into your bloodstream. After the tracer has traveled to your heart, a scanning device is used to measure how much of the tracer is absorbed by or distributed to various areas of your heart. LET [facility] CARE PROVIDER KNOW ABOUT:  Any allergies you have.  All medicines you are taking, including vitamins, herbs, eye drops, creams, and over-the-counter medicines.  Previous problems you  or members of your family have had with the use of anesthetics.  Any blood disorders you have.  Previous surgeries you have had.  Medical conditions you have.  RISKS AND COMPLICATIONS Generally, this is a safe procedure. However, as with any procedure, problems can occur. Possible problems include:   Serious chest pain.  Rapid heartbeat.  Sensation of warmth in your chest. This usually passes  quickly. BEFORE THE PROCEDURE Ask your health care provider about changing or stopping your regular medicines. PROCEDURE This procedure is usually done at a hospital and takes 2-4 hours.  An IV tube is inserted into one of your veins.  Your health care provider will inject a small amount of radioactive tracer through the tube.  You will then wait for 20-40 minutes while the tracer travels through your bloodstream.  You will lie down on an exam table so images of your heart can be taken. Images will be taken for about 15-20 minutes.  You will exercise on a treadmill or stationary bike. While you exercise, your heart activity will be monitored with an electrocardiogram (ECG), and your blood pressure will be checked.  If you are unable to exercise, you may be given a medicine to make your heart beat faster.  When blood flow to your heart has peaked, tracer will again be injected through the IV tube.  After 20-40 minutes, you will get back on the exam table and have more images taken of your heart.  When the procedure is over, your IV tube will be removed. AFTER THE PROCEDURE  You will likely be able to leave shortly after the test. Unless your health care provider tells you otherwise, you may return to your normal schedule, including diet, activities, and medicines.  Make sure you find out how and when you will get your test results.   This information is not intended to replace advice given to you by your health care provider. Make sure you discuss any questions you have with your health care provider.   Document Released: 05/24/2004 Document Revised: 05/04/2013 Document Reviewed: 04/07/2013 Elsevier Interactive Patient Education [employer].

## 2015-11-29 ENCOUNTER — Telehealth: Payer: Self-pay | Admitting: Cardiovascular Disease

## 2015-11-29 NOTE — Telephone Encounter (Signed)
Reviewed myoview instructions w/pt who verbalized understanding and is agreeable w/plan.  

## 2015-11-30 ENCOUNTER — Encounter
Admission: RE | Admit: 2015-11-30 | Discharge: 2015-11-30 | Disposition: A | Discharge status: Home / Self Care | Payer: Commercial Managed Care - HMO | Source: Ambulatory Visit | Attending: Cardiovascular Disease | Admitting: Cardiovascular Disease

## 2015-11-30 DIAGNOSIS — R0602 Shortness of breath: Secondary | ICD-10-CM

## 2015-11-30 LAB — NM MYOCAR MULTI W/SPECT W/WALL MOTION / EF
CHL CUP NUCLEAR SRS: 7
CHL CUP NUCLEAR SSS: 19
CSEPHR: 59 %
CSEPPHR: 90 {beats}/min
LV dias vol: 36 mL (ref 46–106)
LVSYSVOL: 81 mL
Rest HR: 54 {beats}/min
SDS: 9
TID: 1.02

## 2015-11-30 MED ORDER — REGADENOSON 0.4 MG/5ML IV SOLN
0.4000 mg | Freq: Once | INTRAVENOUS | Status: AC
  Administered 2015-11-30: 0.4 mg via INTRAVENOUS

## 2015-11-30 MED ORDER — TECHNETIUM TC 99M TETROFOSMIN IV KIT
13.2700 | PACK | Freq: Once | INTRAVENOUS | Status: AC | PRN
  Administered 2015-11-30: 13.27 via INTRAVENOUS

## 2015-11-30 MED ORDER — TECHNETIUM TC 99M TETROFOSMIN IV KIT
32.4090 | PACK | Freq: Once | INTRAVENOUS | Status: AC | PRN
  Administered 2015-11-30: 31.1 via INTRAVENOUS

## 2015-12-01 ENCOUNTER — Other Ambulatory Visit: Payer: Self-pay

## 2015-12-01 DIAGNOSIS — R9439 Abnormal result of other cardiovascular function study: Secondary | ICD-10-CM

## 2015-12-01 DIAGNOSIS — Z01812 Encounter for preprocedural laboratory examination: Secondary | ICD-10-CM

## 2015-12-04 ENCOUNTER — Other Ambulatory Visit
Admission: RE | Admit: 2015-12-04 | Discharge: 2015-12-04 | Disposition: A | Discharge status: Home / Self Care | Payer: Commercial Managed Care - HMO | Source: Ambulatory Visit | Attending: Cardiovascular Disease | Admitting: Cardiovascular Disease

## 2015-12-04 DIAGNOSIS — Z01812 Encounter for preprocedural laboratory examination: Secondary | ICD-10-CM | POA: Diagnosis present

## 2015-12-04 LAB — CBC WITH DIFFERENTIAL/PLATELET
Basophils Absolute: 0 10*3/uL (ref 0–0.1)
Basophils Relative: 1 %
EOS PCT: 1 %
Eosinophils Absolute: 0.1 10*3/uL (ref 0–0.7)
HCT: 44.8 % (ref 35.0–47.0)
HEMOGLOBIN: 15.2 g/dL (ref 12.0–16.0)
LYMPHS ABS: 1 10*3/uL (ref 1.0–3.6)
LYMPHS PCT: 15 %
MCH: 31.9 pg (ref 26.0–34.0)
MCHC: 33.8 g/dL (ref 32.0–36.0)
MCV: 94.2 fL (ref 80.0–100.0)
Monocytes Absolute: 0.5 10*3/uL (ref 0.2–0.9)
Monocytes Relative: 8 %
NEUTROS PCT: 75 %
Neutro Abs: 5.1 10*3/uL (ref 1.4–6.5)
Platelets: 261 10*3/uL (ref 150–440)
RBC: 4.76 MIL/uL (ref 3.80–5.20)
RDW: 13.4 % (ref 11.5–14.5)
WBC: 6.8 10*3/uL (ref 3.6–11.0)

## 2015-12-04 LAB — BASIC METABOLIC PANEL
ANION GAP: 10 (ref 5–15)
BUN: 16 mg/dL (ref 6–20)
CHLORIDE: 100 mmol/L — AB (ref 101–111)
CO2: 25 mmol/L (ref 22–32)
Calcium: 10 mg/dL (ref 8.9–10.3)
Creatinine, Ser: 0.77 mg/dL (ref 0.44–1.00)
GFR calc non Af Amer: >60 mL/min (ref >60)
Glucose, Bld: 92 mg/dL (ref 65–99)
Potassium: 3.9 mmol/L (ref 3.5–5.1)
SODIUM: 135 mmol/L (ref 135–145)

## 2015-12-04 LAB — PROTIME-INR
INR: 0.87
Prothrombin Time: 12.1 seconds (ref 11.4–15.0)

## 2015-12-07 ENCOUNTER — Encounter: Payer: Self-pay | Admitting: *Deleted

## 2015-12-07 ENCOUNTER — Ambulatory Visit
Admission: RE | Admit: 2015-12-07 | Discharge: 2015-12-07 | Disposition: A | Discharge status: Home / Self Care | Payer: Commercial Managed Care - HMO | Source: Ambulatory Visit | Attending: Cardiovascular Disease | Admitting: Cardiovascular Disease

## 2015-12-07 ENCOUNTER — Encounter
Admission: RE | Disposition: A | Discharge status: Home / Self Care | Payer: Self-pay | Source: Ambulatory Visit | Attending: Cardiovascular Disease

## 2015-12-07 DIAGNOSIS — R0600 Dyspnea, unspecified: Secondary | ICD-10-CM | POA: Diagnosis present

## 2015-12-07 DIAGNOSIS — Z885 Allergy status to narcotic agent status: Secondary | ICD-10-CM | POA: Insufficient documentation

## 2015-12-07 DIAGNOSIS — Z7982 Long term (current) use of aspirin: Secondary | ICD-10-CM | POA: Diagnosis not present

## 2015-12-07 DIAGNOSIS — Z87442 Personal history of urinary calculi: Secondary | ICD-10-CM | POA: Diagnosis not present

## 2015-12-07 DIAGNOSIS — Z85828 Personal history of other malignant neoplasm of skin: Secondary | ICD-10-CM | POA: Diagnosis not present

## 2015-12-07 DIAGNOSIS — Z79899 Other long term (current) drug therapy: Secondary | ICD-10-CM | POA: Diagnosis not present

## 2015-12-07 DIAGNOSIS — I25119 Atherosclerotic heart disease of native coronary artery with unspecified angina pectoris: Secondary | ICD-10-CM

## 2015-12-07 DIAGNOSIS — Z90710 Acquired absence of both cervix and uterus: Secondary | ICD-10-CM | POA: Insufficient documentation

## 2015-12-07 DIAGNOSIS — K589 Irritable bowel syndrome without diarrhea: Secondary | ICD-10-CM | POA: Diagnosis not present

## 2015-12-07 DIAGNOSIS — E785 Hyperlipidemia, unspecified: Secondary | ICD-10-CM | POA: Insufficient documentation

## 2015-12-07 DIAGNOSIS — Z83518 Family history of other specified eye disorder: Secondary | ICD-10-CM | POA: Diagnosis not present

## 2015-12-07 DIAGNOSIS — F17210 Nicotine dependence, cigarettes, uncomplicated: Secondary | ICD-10-CM | POA: Insufficient documentation

## 2015-12-07 DIAGNOSIS — Z8249 Family history of ischemic heart disease and other diseases of the circulatory system: Secondary | ICD-10-CM | POA: Insufficient documentation

## 2015-12-07 DIAGNOSIS — Z881 Allergy status to other antibiotic agents status: Secondary | ICD-10-CM | POA: Insufficient documentation

## 2015-12-07 DIAGNOSIS — G8929 Other chronic pain: Secondary | ICD-10-CM | POA: Diagnosis not present

## 2015-12-07 DIAGNOSIS — M549 Dorsalgia, unspecified: Secondary | ICD-10-CM | POA: Insufficient documentation

## 2015-12-07 DIAGNOSIS — Z87891 Personal history of nicotine dependence: Secondary | ICD-10-CM | POA: Diagnosis not present

## 2015-12-07 DIAGNOSIS — D510 Vitamin B12 deficiency anemia due to intrinsic factor deficiency: Secondary | ICD-10-CM | POA: Insufficient documentation

## 2015-12-07 DIAGNOSIS — I10 Essential (primary) hypertension: Secondary | ICD-10-CM | POA: Diagnosis not present

## 2015-12-07 DIAGNOSIS — Z9049 Acquired absence of other specified parts of digestive tract: Secondary | ICD-10-CM | POA: Insufficient documentation

## 2015-12-07 DIAGNOSIS — Z91041 Radiographic dye allergy status: Secondary | ICD-10-CM | POA: Diagnosis not present

## 2015-12-07 DIAGNOSIS — Z880 Allergy status to penicillin: Secondary | ICD-10-CM | POA: Diagnosis not present

## 2015-12-07 HISTORY — PX: CARDIAC CATHETERIZATION: SHX172

## 2015-12-07 SURGERY — LEFT HEART CATH AND CORONARY ANGIOGRAPHY
Anesthesia: Moderate Sedation | Laterality: Left

## 2015-12-07 MED ORDER — SODIUM CHLORIDE 0.9 % IV SOLN: 250.0000 mL | INTRAVENOUS | Status: DC | PRN

## 2015-12-07 MED ORDER — SODIUM CHLORIDE 0.9 % IV SOLN
INTRAVENOUS | Status: DC
  Administered 2015-12-07: 12:00:00 via INTRAVENOUS

## 2015-12-07 MED ORDER — FENTANYL CITRATE (PF) 100 MCG/2ML IJ SOLN
INTRAMUSCULAR | Status: AC
  Filled 2015-12-07: qty 2

## 2015-12-07 MED ORDER — SODIUM CHLORIDE 0.9 % IV SOLN: INTRAVENOUS | Status: DC

## 2015-12-07 MED ORDER — FAMOTIDINE 20 MG PO TABS
ORAL_TABLET | ORAL | Status: AC
  Administered 2015-12-07: 20 mg
  Filled 2015-12-07: qty 1

## 2015-12-07 MED ORDER — METHYLPREDNISOLONE SODIUM SUCC 125 MG IJ SOLR
INTRAMUSCULAR | Status: AC
  Administered 2015-12-07: 125 mg
  Filled 2015-12-07: qty 2

## 2015-12-07 MED ORDER — SODIUM CHLORIDE 0.9% FLUSH
10.0000 mL | Freq: Once | INTRAVENOUS | Status: AC
  Administered 2015-12-07: 10 mL via INTRAVENOUS

## 2015-12-07 MED ORDER — HEPARIN SODIUM (PORCINE) 1000 UNIT/ML IJ SOLN
INTRAMUSCULAR | Status: AC
  Filled 2015-12-07: qty 1

## 2015-12-07 MED ORDER — SODIUM CHLORIDE 0.9% FLUSH: 3.0000 mL | Freq: Two times a day (BID) | INTRAVENOUS | Status: DC

## 2015-12-07 MED ORDER — HEPARIN (PORCINE) IN NACL 2-0.9 UNIT/ML-% IJ SOLN
INTRAMUSCULAR | Status: AC
  Filled 2015-12-07: qty 500

## 2015-12-07 MED ORDER — MIDAZOLAM HCL 2 MG/2ML IJ SOLN
INTRAMUSCULAR | Status: DC | PRN
  Administered 2015-12-07: 1 mg via INTRAVENOUS

## 2015-12-07 MED ORDER — MIDAZOLAM HCL 2 MG/2ML IJ SOLN
INTRAMUSCULAR | Status: AC
  Filled 2015-12-07: qty 2

## 2015-12-07 MED ORDER — VERAPAMIL HCL 2.5 MG/ML IV SOLN
INTRAVENOUS | Status: AC
  Filled 2015-12-07: qty 2

## 2015-12-07 MED ORDER — FENTANYL CITRATE (PF) 100 MCG/2ML IJ SOLN
INTRAMUSCULAR | Status: DC | PRN
  Administered 2015-12-07 (×2): 25 ug via INTRAVENOUS

## 2015-12-07 MED ORDER — SODIUM CHLORIDE 0.9% FLUSH: 3.0000 mL | INTRAVENOUS | Status: DC | PRN

## 2015-12-07 SURGICAL SUPPLY — 8 items
CATH INFINITI 5 FR JL3.5 (CATHETERS) ×2 IMPLANT
CATH OPTITORQUE JACKY 4.0 5F (CATHETERS) ×2 IMPLANT
DEVICE RAD TR BAND REGULAR (VASCULAR PRODUCTS) ×2 IMPLANT
GLIDESHEATH SLEND SS 6F .021 (SHEATH) ×2 IMPLANT
KIT MANI 3VAL PERCEP (MISCELLANEOUS) ×2 IMPLANT
PACK CARDIAC CATH (CUSTOM PROCEDURE TRAY) ×2 IMPLANT
WIRE HITORQ VERSACORE ST 145CM (WIRE) ×2 IMPLANT
WIRE SAFE-T 1.5MM-J .035X260CM (WIRE) ×2 IMPLANT

## 2015-12-07 NOTE — Interval H&P Note (Signed)
Cath Lab Visit (complete for each Cath Lab visit)  Clinical Evaluation Leading to the Procedure:   ACS: No.  Non-ACS:    Anginal Classification: CCS III  Anti-ischemic medical therapy: Minimal Therapy (1 class of medications)  Non-Invasive Test Results: Intermediate-risk stress test findings: cardiac mortality 1-3%/year  Prior CABG: No previous CABG      History and Physical Interval Note:  12/07/2015 12:58 PM  Susan Mcmillan  has presented today for surgery, with the diagnosis of Abnormal stress test  The various methods of treatment have been discussed with the patient and family. After consideration of risks, benefits and other options for treatment, the patient has consented to  Procedure(s): Left Heart Cath and Coronary Angiography (Left) as a surgical intervention .  The patient's history has been reviewed, patient examined, no change in status, stable for surgery.  I have reviewed the patient's chart and labs.  Questions were answered to the patient's satisfaction.     Susan Mcmillan

## 2015-12-07 NOTE — Discharge Instructions (Signed)
Keep right wrist elevated on pillow above the heart for today.  Watch right wrist for evidence of bleeding or hematoma.. If bleeding or hematoma noted, hold pressure over the site for at least 15 minutes and notify the physician.  No blending or flexing of the wrist--no lifting for the remainder of the day or for 2 weeks after your procedure. Notify the physician for evidence of infection or if you get a temperature. Groin Insertion Instructions-If you lose feeling or develop tingling or pain in your leg or foot after the procedure, please walk around first.  If the discomfort does not improve , contact your physician and proceed to the nearest emergency room.  Loss of feeling in your leg might mean that a blockage has formed in the artery and this can be appropriately treated.  Limit your activity for the next two days after your procedure.  Avoid stooping, bending, heavy lifting or exertion as this may put pressure on the insertion site.  Resume normal activities in 48 hours.  You may shower after 24 hours but avoid excessive warm water and do not scrub the site.  Remove clear dressing in 48 hours.  If you have had a closure device inserted, do not soak in a tub bath or a hot tub for at least one week.  No driving for 48 hours after discharge.  After the procedure, check the insertion site occasionally.  If any oozing occurs or there is apparent swelling, firm pressure over the site will prevent a bruise from forming.  You can not hurt anything by pressing directly on the site.  The pressure stops the bleeding by allowing a small clot to form.  If the bleeding continues after the pressure has been applied for more than 15 minutes, call 911 or go to the nearest emergency room.    The x-ray dye causes you to pass a considerate amount of urine.  For this reason, you will be asked to drink plenty of liquids after the procedure to prevent dehydration.  You may resume you regular diet.  Avoid caffeine products.     For pain at the site of your procedure, take non-aspirin medicines such as Tylenol.  Medications: A. Hold Metformin for 48 hours if applicable.  B. Continue taking all your present medications at home unless your doctor prescribes any changes. Coronary Angiogram A coronary angiogram, also called coronary angiography, is an X-ray procedure used to look at the arteries in the heart. In this procedure, a dye (contrast dye) is injected through a long, hollow tube (catheter). The catheter is about the size of a piece of cooked spaghetti and is inserted through your groin, wrist, or arm. The dye is injected into each artery, and X-rays are then taken to show if there is a blockage in the arteries of your heart. LET [facility] CARE PROVIDER KNOW ABOUT:  Any allergies you have, including allergies to shellfish or contrast dye.   All medicines you are taking, including vitamins, herbs, eye drops, creams, and over-the-counter medicines.   Previous problems you or members of your family have had with the use of anesthetics.   Any blood disorders you have.   Previous surgeries you have had.  History of kidney problems or failure.   Other medical conditions you have. RISKS AND COMPLICATIONS  Generally, a coronary angiogram is a safe procedure. However, problems can occur and include:  Allergic reaction to the dye.  Bleeding from the access site or other locations.  Kidney  injury, especially in people with impaired kidney function.  Stroke (rare).  Heart attack (rare). BEFORE THE PROCEDURE   Do not eat or drink anything after midnight the night before the procedure or as directed by your health care provider.   Ask your health care provider about changing or stopping your regular medicines. This is especially important if you are taking diabetes medicines or blood thinners. PROCEDURE  You may be given a medicine to help you relax (sedative) before the procedure. This medicine is  given through an intravenous (IV) access tube that is inserted into one of your veins.   The area where the catheter will be inserted will be washed and shaved. This is usually done in the groin but may be done in the fold of your arm (near your elbow) or in the wrist.   A medicine will be given to numb the area where the catheter will be inserted (local anesthetic).   The health care provider will insert the catheter into an artery. The catheter will be guided by using a special type of X-ray (fluoroscopy) of the blood vessel being examined.   A special dye will then be injected into the catheter, and X-rays will be taken. The dye will help to show where any narrowing or blockages are located in the heart arteries.  AFTER THE PROCEDURE   If the procedure is done through the leg, you will be kept in bed lying flat for several hours. You will be instructed to not bend or cross your legs.  The insertion site will be checked frequently.   The pulse in your feet or wrist will be checked frequently.   Additional blood tests, X-rays, and an electrocardiogram may be done.    This information is not intended to replace advice given to you by your health care provider. Make sure you discuss any questions you have with your health care provider.   Document Released: 11/03/2002 Document Revised: 05/20/2014 Document Reviewed: 09/21/2012 Elsevier Interactive Patient Education [employer].

## 2015-12-07 NOTE — H&P (View-Only) (Signed)
Cardiology Office Note   Date:  11/21/2015   ID:  Susan Mcmillan, DOB [birth year], MRN [id number]  PCP:  Rica Mast, MD  Cardiologist:   Kathlyn Sacramento, MD   Chief Complaint  Patient presents with  . other    No complaints today. Meds reviewed verbally with pt.      History of Present Illness: Susan Mcmillan is a 70 y.o. female who Was referred by Dr. Gilford Rile for evaluation of coronary atherosclerosis noted on CT scan. She has no previous cardiac history but has multiple risk factors including hypertension, hyperlipidemia and tobacco use. She has been having CT scan of her lungs to follow-up on pulmonary nodule and was found to have three-vessel coronary atherosclerosis. She denies any chest pain. She has chronic exertional dyspnea. She complains of chronic back pain. No orthopnea, PND or significant leg edema. No claudication. Her blood pressure has been controlled and she takes atorvastatin for hyperlipidemia. No previous ischemic cardiac evaluation. She is able to do activities of daily living and her yard work.   Past Medical History  Diagnosis Date  . IBS (irritable bowel syndrome)   . Hypertension   . Pernicious anemia   . 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 Skin cancer   . Hyperlipemia   . 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 Vulvar pain   . Urethral caruncle   . Vulvar leukoplakia   . Dysuria   . Personal history of tobacco use, presenting hazards to health 07/10/2015  . History of kidney stones     Past Surgical History  Procedure Laterality Date  . Cholecystectomy    . Appendectomy    . Abdominal hernia repair  2014    Dr. Rochel Brome  . Cervical biopsy      Dr. Enzo Bi  . Abdominal hysterectomy  1978  . Wide local excision  07/2013    vin 3 w/ margins involoved     Current Outpatient Prescriptions  Medication Sig Dispense Refill    . ALPRAZolam (XANAX) 0.25 MG tablet Take 2 tablets (0.5 mg total) by mouth at bedtime as needed. (Patient taking differently: Take 0.25 mg by mouth at bedtime as needed. ) 60 tablet 0  . aspirin 81 MG tablet Take 81 mg by mouth daily.    Marland Kitchen atorvastatin (LIPITOR) 20 MG tablet Take 1 tablet (20 mg total) by mouth daily. 90 tablet 3  . cyanocobalamin (,VITAMIN B-12,) 1000 MCG/ML injection INJECT 1 ML INTO THE MUSCLE EVERY 30 (THIRTY) DAYS. 10 mL 1  . FLUoxetine (PROZAC) 20 MG capsule TAKE 1 CAPSULE BY MOUTH EVERY DAY 90 capsule 3  . folic acid (FOLVITE) A999333 MCG tablet Take 800 mcg by mouth daily.     Marland Kitchen gabapentin (NEURONTIN) 300 MG capsule Take 300 mg by mouth as needed.     . metoprolol succinate (TOPROL-XL) 50 MG 24 hr tablet TAKE 1 TABLET BY MOUTH DAILY WITH OR IMMEDIATELY FOLLOWING MEAL 90 tablet 3  . triamterene-hydrochlorothiazide (MAXZIDE-25) 37.5-25 MG tablet TAKE 1/2 TAB BY MOUTH DAILY 90 tablet 2   No current facility-administered medications for this visit.    Allergies:   Ace inhibitors; Codeine; Erythromycin; Iodinated diagnostic agents; Other; and Penicillins    Social History:  The patient  reports that she has been smoking Cigarettes and E-cigarettes.  She has a 27.5 pack-year smoking history. She does not  have any smokeless tobacco history on file. She reports that she drinks alcohol. She reports that she does not use illicit drugs.   Family History:  The patient's family history includes Hyperlipidemia in her sister; Hypertension in her sister; Macular degeneration in her sister. There is no history of Diabetes, Heart disease, or Cancer.    ROS:  Please see the history of present illness.   Otherwise, review of systems are positive for none.   All other systems are reviewed and negative.    PHYSICAL EXAM: VS:  BP 134/87 mmHg  Pulse 59  Ht 5\' 2"  (1.575 m)  Wt 154 lb 12 oz (70.194 kg)  BMI 28.30 kg/m2 , BMI Body mass index is 28.3 kg/(m^2). GEN: Well nourished, well  developed, in no acute distress HEENT: normal Neck: no JVD, carotid bruits, or masses Cardiac: RRR; no murmurs, rubs, or gallops,no edema  Respiratory:  clear to auscultation bilaterally, normal work of breathing GI: soft, nontender, nondistended, + BS MS: no deformity or atrophy Skin: warm and dry, no rash Neuro:  Strength and sensation are intact Psych: euthymic mood, full affect   EKG:  EKG is ordered today. The ekg ordered today demonstrates normal sinus rhythm with anterior T wave changes suggestive of ischemia.   Recent Labs: 05/17/2015: Hemoglobin 14.5; Platelets 314.0 11/20/2015: ALT 15; BUN 16; Creatinine, Ser 0.81; Potassium 3.4*; Sodium 141    Lipid Panel    Component Value Date/Time   CHOL 172 05/17/2015 1442   TRIG 288.0* 05/17/2015 1442   HDL 47.70 05/17/2015 1442   CHOLHDL 4 05/17/2015 1442   VLDL 57.6* 05/17/2015 1442   LDLCALC 70 07/05/2014 1414   LDLDIRECT 88.0 05/17/2015 1442      Wt Readings from Last 3 Encounters:  11/21/15 154 lb 12 oz (70.194 kg)  11/20/15 155 lb 12.8 oz (70.67 kg)  07/27/15 152 lb 3.2 oz (69.037 kg)       ASSESSMENT AND PLAN:  1.  Coronary artery disease involving native coronary arteries with exertional dyspnea: CT scan showed evidence of three-vessel coronary atherosclerosis. Her symptoms include exertional dyspnea without chest pain. EKG showed anterior T wave changes suggestive of ischemia. Due to that, I recommend further evaluation with a pharmacologic nuclear stress test to ensure no obstructive coronary artery disease. She is not able to exercise on a treadmill due to chronic back pain and has an abnormal baseline EKG. If stress test does not show evidence of ischemia, I recommend continuing treatment of risk factors.  2. Hyperlipidemia: Continue treatment with atorvastatin with a target LDL of less than 70.  3. Essential hypertension: Blood pressure is controlled on current medications.  4. Tobacco use: We discussed  briefly the importance of smoking cessation and she does not want to quit.   Disposition:   FU with me as needed.   Signed,  Kathlyn Sacramento, MD  11/21/2015 2:48 PM    [place]

## 2015-12-08 ENCOUNTER — Encounter: Payer: Self-pay | Admitting: Cardiovascular Disease

## 2015-12-26 ENCOUNTER — Ambulatory Visit (INDEPENDENT_AMBULATORY_CARE_PROVIDER_SITE_OTHER): Payer: Commercial Managed Care - HMO | Admitting: Nurse Practitioner

## 2015-12-26 ENCOUNTER — Encounter: Payer: Self-pay | Admitting: Nurse Practitioner

## 2015-12-26 VITALS — BP 118/78 | HR 55 | Ht 63.0 in | Wt 156.0 lb

## 2015-12-26 DIAGNOSIS — E785 Hyperlipidemia, unspecified: Secondary | ICD-10-CM

## 2015-12-26 DIAGNOSIS — Z720 Tobacco use: Secondary | ICD-10-CM

## 2015-12-26 DIAGNOSIS — I119 Hypertensive heart disease without heart failure: Secondary | ICD-10-CM | POA: Diagnosis not present

## 2015-12-26 DIAGNOSIS — E7800 Pure hypercholesterolemia, unspecified: Secondary | ICD-10-CM | POA: Insufficient documentation

## 2015-12-26 DIAGNOSIS — I2583 Coronary atherosclerosis due to lipid rich plaque: Principal | ICD-10-CM

## 2015-12-26 DIAGNOSIS — I2510 Atherosclerotic heart disease of native coronary artery without angina pectoris: Secondary | ICD-10-CM | POA: Diagnosis not present

## 2015-12-26 DIAGNOSIS — K589 Irritable bowel syndrome without diarrhea: Secondary | ICD-10-CM | POA: Insufficient documentation

## 2015-12-26 MED ORDER — ATORVASTATIN CALCIUM 80 MG PO TABS: 80.0000 mg | ORAL_TABLET | Freq: Every day | ORAL | 3 refills | Status: DC

## 2015-12-26 NOTE — Progress Notes (Signed)
Office Visit    Patient Name: Susan Mcmillan Date of Encounter: 12/26/2015  Primary Care Provider:  Rica Mast, MD Primary Cardiologist:  Jerilynn Mages. Fletcher Anon, MD   Chief Complaint    70 year old female with a history of hypertension, hyperlipidemia, tobacco abuse, and recent finding of coronary atherosclerosis on a CT scan, who presents for follow-up after diagnostic catheterization.  Past Medical History    Past Medical History:  Diagnosis Date  . CAD (coronary artery disease)    a. 11/2015 MV: mod mid-distal ant ischemia, EF 58% w/ apical HK;  b. 11/2015 Cath: LM nl, LAD 80/156m (fills vial collats from RPDA & D1), D1 60ost, LCX small, nl, RCA 50p/m, RPDA nl, RPAV min irregs, RPl1/2/3 min irregs, EF 50-55%.  . Dysuria   . History of kidney stones   . Hyperlipemia   . Hypertensive heart disease   . IBS (irritable bowel syndrome)   . Pernicious anemia   . Personal history of tobacco use, presenting hazards to health 07/10/2015  . 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 Skin cancer   . Tobacco abuse    a. quit 11/2015.  Marland Kitchen Urethral caruncle   . Vulvar adhesions 10/06/2013   perianal skin bridge noted at posterior fourchette and region of WLE surgical site  . Vulvar leukoplakia   . Vulvar pain    Past Surgical History:  Procedure Laterality Date  . ABDOMINAL HERNIA REPAIR  2014   Dr. Rochel Brome  . ABDOMINAL HYSTERECTOMY  1978  . APPENDECTOMY    . CARDIAC CATHETERIZATION Left 12/07/2015   Procedure: Left Heart Cath and Coronary Angiography;  Surgeon: Wellington Hampshire, MD;  Location: [facility];  Service: Cardiovascular;  Laterality: Left;  . CERVICAL BIOPSY     Dr. Enzo Bi  . CHOLECYSTECTOMY    . wide local excision  07/2013   vin 3 w/ margins involoved    Allergies  Allergies  Allergen Reactions  . Ace Inhibitors Other (See Comments)  . Codeine   . Demerol [Meperidine] Other (See Comments)    Heavy sedation  . Erythromycin  Nausea Only    "All Mycins cause nausea"  . Iodinated Diagnostic Agents Nausea And Vomiting  . Other Other (See Comments)  . Penicillins Nausea Only    Can do amoxicillin Has patient had a PCN reaction causing immediate rash, facial/tongue/throat swelling, SOB or lightheadedness with hypotension: Yes Has patient had a PCN reaction causing severe rash involving mucus membranes or skin necrosis: No Has patient had a PCN reaction that required hospitalization No Has patient had a PCN reaction occurring within the last 10 years: No If all of the above answers are "NO", then may proceed with Cephalosporin use.     History of Present Illness    70 year old female with a prior history of hypertension, hyperlipidemia, and tobacco abuse. She also has a history of pulmonary nodule, and in that setting, she underwent CT of the chest in March which incidentally showed multivessel coronary atherosclerosis. She followed up with Dr. Fletcher Anon and reported dyspnea on exertion. She was also noted to have anterior T-wave inversion on her ECG. She underwent stress testing which was abnormal with moderate mid to distal anterior ischemia, normal LV function, and apical hypokinesis. This was followed by diagnostic catheterization which revealed a totally occluded mid LAD with right-to-left and left to left collaterals. She otherwise had moderate RCA and branch vessel disease. Medical therapy was recommended. Since her catheterization, she  has done well. She has quit smoking. She has remained active around her home and has not been having any chest pain. Her right wrist has healed up well. She is watching her diet much more closely now. She denies PND, orthopnea, dizziness, syncope, edema, or early satiety.  Home Medications    Prior to Admission medications   Medication Sig Start Date End Date Taking? Authorizing Provider  ALPRAZolam Duanne Moron) 0.25 MG tablet Take 2 tablets (0.5 mg total) by mouth at bedtime as  needed. Patient taking differently: Take 0.25 mg by mouth at bedtime as needed for sleep.  11/25/14  Yes Jackolyn Confer, MD  aspirin 81 MG tablet Take 81 mg by mouth daily.   Yes Historical Provider, MD  atorvastatin (LIPITOR) 80 MG tablet Take 1 tablet (80 mg total) by mouth daily. 12/26/15  Yes Rogelia Mire, NP  cyanocobalamin (,VITAMIN B-12,) 1000 MCG/ML injection INJECT 1 ML INTO THE MUSCLE EVERY 30 (THIRTY) DAYS. 05/22/15  Yes Jackolyn Confer, MD  FLUoxetine (PROZAC) 20 MG capsule TAKE 1 CAPSULE BY MOUTH EVERY DAY Patient taking differently: Take 20 mg by mouth daily. TAKE 1 CAPSULE BY MOUTH EVERY DAY 11/20/15  Yes Jackolyn Confer, MD  folic acid (FOLVITE) A999333 MCG tablet Take 800 mcg by mouth daily.    Yes Historical Provider, MD  gabapentin (NEURONTIN) 300 MG capsule Take 100-300 mg by mouth See admin instructions. 100 mg every morning and 300 mg every evening 07/22/15  Yes Historical Provider, MD  loratadine (ALLERGY) 10 MG tablet Take 10 mg by mouth daily as needed for allergies.   Yes Historical Provider, MD  metoprolol succinate (TOPROL-XL) 50 MG 24 hr tablet TAKE 1 TABLET BY MOUTH DAILY WITH OR IMMEDIATELY FOLLOWING MEAL 11/20/15  Yes Jackolyn Confer, MD  triamterene-hydrochlorothiazide ([id number]) 37.5-25 MG tablet TAKE 1/2 TAB BY MOUTH DAILY 11/20/15  Yes Jackolyn Confer, MD    Review of Systems    As above, she has been doing well.  She denies chest pain, palpitations, dyspnea, pnd, orthopnea, n, v, dizziness, syncope, edema, weight gain, or early satiety.  All other systems reviewed and are otherwise negative except as noted above.  Physical Exam    VS:  BP 118/78 (BP Location: Left Arm, Patient Position: Sitting, Cuff Size: Normal)   Pulse (!) 55   Ht 5\' 3"  (1.6 m)   Wt 156 lb (70.8 kg)   BMI 27.63 kg/m  , BMI Body mass index is 27.63 kg/m. GEN: Well nourished, well developed, in no acute distress.  HEENT: normal.  Neck: Supple, no JVD, carotid bruits, or  masses. Cardiac: RRR, no murmurs, rubs, or gallops. No clubbing, cyanosis, edema.  Radials/DP/PT 2+ and equal bilaterally. Right wrist catheterization site without bleeding, bruit, or hematoma. Respiratory:  Respirations regular and unlabored, clear to auscultation bilaterally. GI: Soft, nontender, nondistended, BS + x 4. MS: no deformity or atrophy. Skin: warm and dry, no rash. Neuro:  Strength and sensation are intact. Psych: Normal affect.  Accessory Clinical Findings    ECG - Suspect cardiac, 55, anterolateral T-wave inversion  Assessment & Plan    1.  Coronary artery disease: Patient recently underwent evaluation secondary to incidental finding of coronary atherosclerosis on CT scan followed by abnormal EKG, abnormal stress test, and ultimately catheterization which revealed a total occlusion of the LAD with right-to-left and left to left collaterals. She has not been having chest pain and is being medically managed. She is currently on aspirin, statin, and beta  blocker therapy. In the setting of coronary disease, I will titrate her Lipitor to a high-intensity dose of 80 mg daily. We will plan to follow-up lipids and LFTs in 6 weeks. Otherwise continue medical therapy. I have encouraged her to continue to be active.  2. Hypertensive heart disease: Blood pressure is stable today on beta blocker and diuretic therapy.  3. Hyperlipidemia: Continue statin therapy but increased intensity in dose to 80 mg. Follow-up lipids and LFTs in 6 weeks.  4. Tobacco abuse: Patient says that she quit smoking following her catheterization. I encouraged her to remain off of cigarettes. Unfortunately, her husband still smokes and thus she is exposed to secondhand smoke.  5. Disposition: Follow-up lipids and LFTs in 6 weeks. Follow-up with Dr. Fletcher Anon in 3 months or sooner if necessary.   Murray Hodgkins, NP 12/26/2015, 4:13 PM

## 2015-12-26 NOTE — Patient Instructions (Signed)
Medication Instructions:  Your physician has recommended you make the following change in your medication:  INCREASE lipitor to 80mg  once daily   Labwork: Fasting lipid and liver profile in six weeks. Nothing to eat or drink after midnight the evening before your labs.   Testing/Procedures: none  Follow-Up: Your physician recommends that you schedule a follow-up appointment in: 3 months with Dr. Fletcher Anon.    Any Other Special Instructions Will Be Listed Below (If Applicable).     If you need a refill on your cardiac medications before your next appointment, please call your pharmacy.

## 2016-01-16 ENCOUNTER — Other Ambulatory Visit: Payer: Self-pay

## 2016-01-16 MED ORDER — TRIAMTERENE-HCTZ 37.5-25 MG PO TABS: ORAL_TABLET | ORAL | 1 refills | Status: DC

## 2016-01-16 NOTE — Progress Notes (Signed)
New RX script with new PCP name

## 2016-01-25 ENCOUNTER — Other Ambulatory Visit: Payer: Self-pay

## 2016-01-25 MED ORDER — FLUOXETINE HCL 20 MG PO CAPS: ORAL_CAPSULE | ORAL | 3 refills | Status: DC

## 2016-02-06 ENCOUNTER — Other Ambulatory Visit (INDEPENDENT_AMBULATORY_CARE_PROVIDER_SITE_OTHER): Payer: Commercial Managed Care - HMO

## 2016-02-06 DIAGNOSIS — I2583 Coronary atherosclerosis due to lipid rich plaque: Principal | ICD-10-CM

## 2016-02-06 DIAGNOSIS — I2510 Atherosclerotic heart disease of native coronary artery without angina pectoris: Secondary | ICD-10-CM

## 2016-02-07 LAB — LIPID PANEL
CHOL/HDL RATIO: 2.8 ratio (ref 0.0–4.4)
Cholesterol, Total: 149 mg/dL (ref 100–199)
HDL: 54 mg/dL (ref >39)
LDL CALC: 64 mg/dL (ref 0–99)
Triglycerides: 155 mg/dL — ABNORMAL HIGH (ref 0–149)
VLDL CHOLESTEROL CAL: 31 mg/dL (ref 5–40)

## 2016-02-07 LAB — HEPATIC FUNCTION PANEL
ALBUMIN: 4.2 g/dL (ref 3.6–4.8)
ALK PHOS: 83 IU/L (ref 39–117)
ALT: 23 IU/L (ref 0–32)
AST: 18 IU/L (ref 0–40)
BILIRUBIN, DIRECT: 0.15 mg/dL (ref 0.00–0.40)
Bilirubin Total: 0.5 mg/dL (ref 0.0–1.2)
TOTAL PROTEIN: 6.7 g/dL (ref 6.0–8.5)

## 2016-03-22 ENCOUNTER — Encounter: Payer: Self-pay | Admitting: Cardiovascular Disease

## 2016-03-22 ENCOUNTER — Ambulatory Visit (INDEPENDENT_AMBULATORY_CARE_PROVIDER_SITE_OTHER): Payer: Commercial Managed Care - HMO | Admitting: Cardiovascular Disease

## 2016-03-22 VITALS — BP 120/70 | HR 55 | Ht 63.0 in | Wt 156.0 lb

## 2016-03-22 DIAGNOSIS — E7800 Pure hypercholesterolemia, unspecified: Secondary | ICD-10-CM | POA: Diagnosis not present

## 2016-03-22 DIAGNOSIS — I10 Essential (primary) hypertension: Secondary | ICD-10-CM

## 2016-03-22 DIAGNOSIS — Z720 Tobacco use: Secondary | ICD-10-CM

## 2016-03-22 DIAGNOSIS — I2510 Atherosclerotic heart disease of native coronary artery without angina pectoris: Secondary | ICD-10-CM | POA: Diagnosis not present

## 2016-03-22 NOTE — Patient Instructions (Signed)
Medication Instructions: Continue same medications.   Labwork: None.   Procedures/Testing: None.   Follow-Up: 6 months with Dr. Deondra Wigger.   Any Additional Special Instructions Will Be Listed Below (If Applicable).     If you need a refill on your cardiac medications before your next appointment, please call your pharmacy.   

## 2016-03-22 NOTE — Progress Notes (Signed)
Cardiology Office Note   Date:  03/22/2016   ID:  Susan Mcmillan, Susan Mcmillan [birth year], MRN [id number]  PCP:  Rica Mast, MD  Cardiologist:   Kathlyn Sacramento, MD   Chief Complaint  Patient presents with  . other    3 month follow up as well as discuss labs. Meds reviewed by the patient verbally.       History of Present Illness: Susan Mcmillan is a 70 y.o. female who Is here today for a follow-up visit regarding coronary artery disease. She had cardiac catheterization done in July after an abnormal stress test. It showed chronically occluded mid LAD with faint collaterals, there was moderate RCA disease. Ejection fraction was 50-55%. She was treated medically. She has chronic medical conditions that include hypertension, hyperlipidemia and tobacco use.  She quit smoking since her cardiac catheterization. She was switched to high dose atorvastatin. He has been doing extremely well and denies any chest pain or significant dyspnea.  Past Medical History:  Diagnosis Date  . CAD (coronary artery disease)    a. 11/2015 MV: mod mid-distal ant ischemia, EF 58% w/ apical HK;  b. 11/2015 Cath: LM nl, LAD 80/168m (fills vial collats from RPDA & D1), D1 60ost, LCX small, nl, RCA 50p/m, RPDA nl, RPAV min irregs, RPl1/2/3 min irregs, EF 50-55%.  . Dysuria   . History of kidney stones   . Hyperlipemia   . Hypertensive heart disease   . IBS (irritable bowel syndrome)   . Pernicious anemia   . Personal history of tobacco use, presenting hazards to health 07/10/2015  . 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 Skin cancer   . Tobacco abuse    a. quit 11/2015.  Marland Kitchen Urethral caruncle   . Vulvar adhesions 10/06/2013   perianal skin bridge noted at posterior fourchette and region of WLE surgical site  . Vulvar leukoplakia   . Vulvar pain     Past Surgical History:  Procedure Laterality Date  . ABDOMINAL HERNIA REPAIR  2014   Dr. Rochel Brome  . ABDOMINAL HYSTERECTOMY   1978  . APPENDECTOMY    . CARDIAC CATHETERIZATION Left 12/07/2015   Procedure: Left Heart Cath and Coronary Angiography;  Surgeon: Wellington Hampshire, MD;  Location: [facility];  Service: Cardiovascular;  Laterality: Left;  . CERVICAL BIOPSY     Dr. Enzo Bi  . CHOLECYSTECTOMY    . wide local excision  07/2013   vin 3 w/ margins involoved     Current Outpatient Prescriptions  Medication Sig Dispense Refill  . ALPRAZolam (XANAX) 0.25 MG tablet Take 2 tablets (0.5 mg total) by mouth at bedtime as needed. (Patient taking differently: Take 0.25 mg by mouth at bedtime as needed for sleep. ) 60 tablet 0  . aspirin 81 MG tablet Take 81 mg by mouth daily.    Marland Kitchen atorvastatin (LIPITOR) 80 MG tablet Take 1 tablet (80 mg total) by mouth daily. 30 tablet 3  . cyanocobalamin (,VITAMIN B-12,) 1000 MCG/ML injection INJECT 1 ML INTO THE MUSCLE EVERY 30 (THIRTY) DAYS. 10 mL 1  . FLUoxetine (PROZAC) 20 MG capsule TAKE 1 CAPSULE BY MOUTH EVERY DAY 90 capsule 3  . folic acid (FOLVITE) A999333 MCG tablet Take 800 mcg by mouth daily.     Marland Kitchen gabapentin (NEURONTIN) 300 MG capsule Take 100-300 mg by mouth See admin instructions. 100 mg every morning and 300 mg every evening    . loratadine (ALLERGY) 10 MG  tablet Take 10 mg by mouth daily as needed for allergies.    . metoprolol succinate (TOPROL-XL) 50 MG 24 hr tablet TAKE 1 TABLET BY MOUTH DAILY WITH OR IMMEDIATELY FOLLOWING MEAL 90 tablet 3  . triamterene-hydrochlorothiazide (MAXZIDE-25) 37.5-25 MG tablet TAKE 1/2 TAB BY MOUTH DAILY 90 tablet 1   No current facility-administered medications for this visit.     Allergies:   Ace inhibitors; Codeine; Demerol [meperidine]; Erythromycin; Iodinated diagnostic agents; Other; and Penicillins    Social History:  The patient  reports that she quit smoking about 3 months ago. Her smoking use included Cigarettes and E-cigarettes. She has a 27.50 pack-year smoking history. She has never used smokeless tobacco. She  reports that she drinks alcohol. She reports that she does not use drugs.   Family History:  The patient's family history includes Hyperlipidemia in her sister; Hypertension in her sister; Macular degeneration in her sister.    ROS:  Please see the history of present illness.   Otherwise, review of systems are positive for none.   All other systems are reviewed and negative.    PHYSICAL EXAM: VS:  BP 120/70 (BP Location: Left Arm, Patient Position: Sitting, Cuff Size: Normal)   Pulse (!) 55   Ht 5\' 3"  (1.6 m)   Wt 156 lb (70.8 kg)   BMI 27.63 kg/m  , BMI Body mass index is 27.63 kg/m. GEN: Well nourished, well developed, in no acute distress  HEENT: normal  Neck: no JVD, carotid bruits, or masses Cardiac: RRR; no murmurs, rubs, or gallops,no edema  Respiratory:  clear to auscultation bilaterally, normal work of breathing GI: soft, nontender, nondistended, + BS MS: no deformity or atrophy  Skin: warm and dry, no rash Neuro:  Strength and sensation are intact Psych: euthymic mood, full affect   EKG:  EKG is ordered today. The ekg ordered today demonstrates normal sinus rhythm with anterior T wave changes suggestive of ischemia.   Recent Labs: 12/04/2015: BUN 16; Creatinine, Ser 0.77; Hemoglobin 15.2; Platelets 261; Potassium 3.9; Sodium 135 02/06/2016: ALT 23    Lipid Panel    Component Value Date/Time   CHOL 149 02/06/2016 0953   TRIG 155 (H) 02/06/2016 0953   HDL 54 02/06/2016 0953   CHOLHDL 2.8 02/06/2016 0953   CHOLHDL 4 05/17/2015 1442   VLDL 57.6 (H) 05/17/2015 1442   LDLCALC 64 02/06/2016 0953   LDLDIRECT 88.0 05/17/2015 1442      Wt Readings from Last 3 Encounters:  03/22/16 156 lb (70.8 kg)  12/26/15 156 lb (70.8 kg)  11/21/15 154 lb 12 oz (70.2 kg)       ASSESSMENT AND PLAN:  1.  Coronary artery disease involving native coronary arteries without angina: She is known to chronically occluded LAD which is being treated medically. She currently has no  symptoms suggestive of angina.  2. Hyperlipidemia: Continue treatment with high dose atorvastatin. Most recent lipid profile showed improvement in LDL to 64 which is at target  3. Essential hypertension: Blood pressure is controlled on current medications.  4. Tobacco use:  She almost quit completely. She smokes randomly but she was determined to quit completely.  Disposition:   FU with me in 6 months.  Signed,  Kathlyn Sacramento, MD  03/22/2016 2:41 PM    [place]

## 2016-05-14 ENCOUNTER — Telehealth: Payer: Self-pay | Admitting: Cardiovascular Disease

## 2016-05-14 ENCOUNTER — Other Ambulatory Visit: Payer: Self-pay

## 2016-05-14 ENCOUNTER — Other Ambulatory Visit: Payer: Self-pay | Admitting: Cardiovascular Disease

## 2016-05-14 MED ORDER — ATORVASTATIN CALCIUM 80 MG PO TABS: 80.0000 mg | ORAL_TABLET | Freq: Every day | ORAL | 1 refills | Status: DC

## 2016-05-14 NOTE — Telephone Encounter (Signed)
*  STAT* If patient is at the pharmacy, call can be transferred to refill team.   1. Which medications need to be refilled? (please list name of each medication and dose if known) atorvastatin (LIPITOR) 80 MG tablet  2. Which pharmacy/location (including street and city if local pharmacy) is medication to be sent to? [address]  3. Do they need a 30 day or 90 day supply? 90day

## 2016-06-27 DIAGNOSIS — M5416 Radiculopathy, lumbar region: Secondary | ICD-10-CM | POA: Diagnosis not present

## 2016-06-27 DIAGNOSIS — M5136 Other intervertebral disc degeneration, lumbar region: Secondary | ICD-10-CM | POA: Diagnosis not present

## 2016-06-28 DIAGNOSIS — X32XXXA Exposure to sunlight, initial encounter: Secondary | ICD-10-CM | POA: Diagnosis not present

## 2016-06-28 DIAGNOSIS — D485 Neoplasm of uncertain behavior of skin: Secondary | ICD-10-CM | POA: Diagnosis not present

## 2016-06-28 DIAGNOSIS — L570 Actinic keratosis: Secondary | ICD-10-CM | POA: Diagnosis not present

## 2016-06-28 DIAGNOSIS — Z08 Encounter for follow-up examination after completed treatment for malignant neoplasm: Secondary | ICD-10-CM | POA: Diagnosis not present

## 2016-06-28 DIAGNOSIS — D0472 Carcinoma in situ of skin of left lower limb, including hip: Secondary | ICD-10-CM | POA: Diagnosis not present

## 2016-06-28 DIAGNOSIS — Z85828 Personal history of other malignant neoplasm of skin: Secondary | ICD-10-CM | POA: Diagnosis not present

## 2016-07-17 ENCOUNTER — Telehealth: Payer: Self-pay | Admitting: *Deleted

## 2016-07-17 DIAGNOSIS — Z87891 Personal history of nicotine dependence: Secondary | ICD-10-CM

## 2016-07-17 NOTE — Telephone Encounter (Signed)
Notified patient that annual lung cancer screening low dose CT scan is due. Confirmed that patient is within the age range of 55-77, and asymptomatic, (no signs or symptoms of lung cancer). Patient denies illness that would prevent curative treatment for lung cancer if found. The patient is a current,  smoker, with a 30.6 pack year history. The shared decision making visit was done 06/15/14. Patient is agreeable for CT scan being scheduled.

## 2016-07-24 DIAGNOSIS — M5416 Radiculopathy, lumbar region: Secondary | ICD-10-CM | POA: Diagnosis not present

## 2016-07-24 DIAGNOSIS — M5136 Other intervertebral disc degeneration, lumbar region: Secondary | ICD-10-CM | POA: Diagnosis not present

## 2016-07-24 DIAGNOSIS — M7062 Trochanteric bursitis, left hip: Secondary | ICD-10-CM | POA: Diagnosis not present

## 2016-07-24 DIAGNOSIS — M7061 Trochanteric bursitis, right hip: Secondary | ICD-10-CM | POA: Diagnosis not present

## 2016-07-31 ENCOUNTER — Ambulatory Visit
Admission: RE | Admit: 2016-07-31 | Discharge: 2016-07-31 | Disposition: A | Discharge status: Home / Self Care | Payer: PPO | Source: Ambulatory Visit | Attending: Oncology | Admitting: Oncology

## 2016-07-31 DIAGNOSIS — F17210 Nicotine dependence, cigarettes, uncomplicated: Secondary | ICD-10-CM | POA: Diagnosis not present

## 2016-07-31 DIAGNOSIS — Z122 Encounter for screening for malignant neoplasm of respiratory organs: Secondary | ICD-10-CM | POA: Insufficient documentation

## 2016-07-31 DIAGNOSIS — I700 Atherosclerosis of aorta: Secondary | ICD-10-CM | POA: Diagnosis not present

## 2016-07-31 DIAGNOSIS — D3501 Benign neoplasm of right adrenal gland: Secondary | ICD-10-CM | POA: Insufficient documentation

## 2016-07-31 DIAGNOSIS — Z87891 Personal history of nicotine dependence: Secondary | ICD-10-CM

## 2016-07-31 DIAGNOSIS — I2510 Atherosclerotic heart disease of native coronary artery without angina pectoris: Secondary | ICD-10-CM | POA: Insufficient documentation

## 2016-07-31 DIAGNOSIS — K449 Diaphragmatic hernia without obstruction or gangrene: Secondary | ICD-10-CM | POA: Insufficient documentation

## 2016-08-01 ENCOUNTER — Other Ambulatory Visit: Payer: Self-pay | Admitting: Physician Assistant

## 2016-08-01 DIAGNOSIS — Z1231 Encounter for screening mammogram for malignant neoplasm of breast: Secondary | ICD-10-CM

## 2016-08-02 DIAGNOSIS — D0472 Carcinoma in situ of skin of left lower limb, including hip: Secondary | ICD-10-CM | POA: Diagnosis not present

## 2016-08-02 DIAGNOSIS — D045 Carcinoma in situ of skin of trunk: Secondary | ICD-10-CM | POA: Diagnosis not present

## 2016-08-05 ENCOUNTER — Encounter: Payer: Self-pay | Admitting: *Deleted

## 2016-08-23 DIAGNOSIS — E782 Mixed hyperlipidemia: Secondary | ICD-10-CM | POA: Diagnosis not present

## 2016-08-23 DIAGNOSIS — I10 Essential (primary) hypertension: Secondary | ICD-10-CM | POA: Diagnosis not present

## 2016-08-29 DIAGNOSIS — M5136 Other intervertebral disc degeneration, lumbar region: Secondary | ICD-10-CM | POA: Diagnosis not present

## 2016-08-29 DIAGNOSIS — M5416 Radiculopathy, lumbar region: Secondary | ICD-10-CM | POA: Diagnosis not present

## 2016-08-30 ENCOUNTER — Ambulatory Visit
Admission: RE | Admit: 2016-08-30 | Discharge: 2016-08-30 | Disposition: A | Discharge status: Home / Self Care | Payer: PPO | Source: Ambulatory Visit | Attending: Physician Assistant | Admitting: Physician Assistant

## 2016-08-30 DIAGNOSIS — F4323 Adjustment disorder with mixed anxiety and depressed mood: Secondary | ICD-10-CM | POA: Diagnosis not present

## 2016-08-30 DIAGNOSIS — I2510 Atherosclerotic heart disease of native coronary artery without angina pectoris: Secondary | ICD-10-CM | POA: Diagnosis not present

## 2016-08-30 DIAGNOSIS — M5136 Other intervertebral disc degeneration, lumbar region: Secondary | ICD-10-CM | POA: Diagnosis not present

## 2016-08-30 DIAGNOSIS — I10 Essential (primary) hypertension: Secondary | ICD-10-CM | POA: Diagnosis not present

## 2016-08-30 DIAGNOSIS — E782 Mixed hyperlipidemia: Secondary | ICD-10-CM | POA: Diagnosis not present

## 2016-08-30 DIAGNOSIS — Z1231 Encounter for screening mammogram for malignant neoplasm of breast: Secondary | ICD-10-CM

## 2016-08-30 DIAGNOSIS — D0471 Carcinoma in situ of skin of right lower limb, including hip: Secondary | ICD-10-CM | POA: Diagnosis not present

## 2016-08-30 DIAGNOSIS — I2583 Coronary atherosclerosis due to lipid rich plaque: Secondary | ICD-10-CM | POA: Diagnosis not present

## 2016-08-30 DIAGNOSIS — M5416 Radiculopathy, lumbar region: Secondary | ICD-10-CM | POA: Diagnosis not present

## 2016-09-23 ENCOUNTER — Ambulatory Visit (INDEPENDENT_AMBULATORY_CARE_PROVIDER_SITE_OTHER): Payer: PPO | Admitting: Cardiovascular Disease

## 2016-09-23 ENCOUNTER — Encounter: Payer: Self-pay | Admitting: Cardiovascular Disease

## 2016-09-23 VITALS — BP 140/70 | HR 66 | Ht 62.0 in | Wt 158.5 lb

## 2016-09-23 DIAGNOSIS — Z720 Tobacco use: Secondary | ICD-10-CM | POA: Diagnosis not present

## 2016-09-23 DIAGNOSIS — I2510 Atherosclerotic heart disease of native coronary artery without angina pectoris: Secondary | ICD-10-CM | POA: Diagnosis not present

## 2016-09-23 DIAGNOSIS — E7800 Pure hypercholesterolemia, unspecified: Secondary | ICD-10-CM

## 2016-09-23 DIAGNOSIS — I10 Essential (primary) hypertension: Secondary | ICD-10-CM | POA: Diagnosis not present

## 2016-09-23 MED ORDER — ATORVASTATIN CALCIUM 80 MG PO TABS: 80.0000 mg | ORAL_TABLET | Freq: Every day | ORAL | 3 refills | Status: DC

## 2016-09-23 NOTE — Patient Instructions (Signed)
Medication Instructions: Continue same medications.   Labwork: None.   Procedures/Testing: None.   Follow-Up: 6 months with Dr. Sweta Halseth.   Any Additional Special Instructions Will Be Listed Below (If Applicable).     If you need a refill on your cardiac medications before your next appointment, please call your pharmacy.   

## 2016-09-23 NOTE — Progress Notes (Signed)
Cardiology Office Note   Date:  09/23/2016   ID:  Mcmillan, Susan Mar 20, 1946, MRN [id number]  PCP:  Marinda Elk, MD  Cardiologist:   Kathlyn Sacramento, MD   Chief Complaint  Patient presents with  . other    6 month follow up. Meds reviewed by the pt. verbally. "doing well."       History of Present Illness: Susan Mcmillan is a 71 y.o. female who Is here today for a follow-up visit regarding coronary artery disease. She had cardiac catheterization done in July, 2017 after an abnormal stress test. It showed chronically occluded mid LAD with faint collaterals with moderate RCA disease. Ejection fraction was 50-55%. She was treated medically. She has chronic medical conditions that include hypertension, hyperlipidemia and tobacco use.  She has extensive skin cancer on both legs that require treatment frequently. No chest pain, shortness of breath or palpitations. She smokes a cigarette every now and then when she drinks alcohol. She does not smoke on a daily basis.   Past Medical History:  Diagnosis Date  . CAD (coronary artery disease)    a. 11/2015 MV: mod mid-distal ant ischemia, EF 58% w/ apical HK;  b. 11/2015 Cath: LM nl, LAD 80/119m (fills vial collats from RPDA & D1), D1 60ost, LCX small, nl, RCA 50p/m, RPDA nl, RPAV min irregs, RPl1/2/3 min irregs, EF 50-55%.  . Dysuria   . History of kidney stones   . Hyperlipemia   . Hypertensive heart disease   . IBS (irritable bowel syndrome)   . Pernicious anemia   . Personal history of tobacco use, presenting hazards to health 07/10/2015  . 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 Skin cancer   . Tobacco abuse    a. quit 11/2015.  Marland Kitchen Urethral caruncle   . Vulvar adhesions 10/06/2013   perianal skin bridge noted at posterior fourchette and region of WLE surgical site  . Vulvar leukoplakia   . Vulvar pain     Past Surgical History:  Procedure Laterality Date  . ABDOMINAL HERNIA REPAIR  2014   Dr.  Rochel Brome  . ABDOMINAL HYSTERECTOMY  1978  . APPENDECTOMY    . CARDIAC CATHETERIZATION Left 12/07/2015   Procedure: Left Heart Cath and Coronary Angiography;  Surgeon: Wellington Hampshire, MD;  Location: [facility];  Service: Cardiovascular;  Laterality: Left;  . CERVICAL BIOPSY     Dr. Enzo Bi  . CHOLECYSTECTOMY    . wide local excision  07/2013   vin 3 w/ margins involoved     Current Outpatient Prescriptions  Medication Sig Dispense Refill  . ALPRAZolam (XANAX) 0.25 MG tablet Take 2 tablets (0.5 mg total) by mouth at bedtime as needed. (Patient taking differently: Take 0.25 mg by mouth at bedtime as needed for sleep. ) 60 tablet 0  . aspirin 81 MG tablet Take 81 mg by mouth daily.    Marland Kitchen atorvastatin (LIPITOR) 80 MG tablet Take 1 tablet (80 mg total) by mouth daily. 90 tablet 1  . cyanocobalamin (,VITAMIN B-12,) 1000 MCG/ML injection INJECT 1 ML INTO THE MUSCLE EVERY 30 (THIRTY) DAYS. 10 mL 1  . FLUoxetine (PROZAC) 20 MG capsule TAKE 1 CAPSULE BY MOUTH EVERY DAY 90 capsule 3  . folic acid (FOLVITE) 559 MCG tablet Take 800 mcg by mouth daily.     Marland Kitchen gabapentin (NEURONTIN) 300 MG capsule Take 100-300 mg by mouth See admin instructions. 100 mg every morning and 300  mg every evening    . loratadine (ALLERGY) 10 MG tablet Take 10 mg by mouth daily as needed for allergies.    . metoprolol succinate (TOPROL-XL) 50 MG 24 hr tablet TAKE 1 TABLET BY MOUTH DAILY WITH OR IMMEDIATELY FOLLOWING MEAL 90 tablet 3  . triamterene-hydrochlorothiazide (MAXZIDE-25) 37.5-25 MG tablet TAKE 1/2 TAB BY MOUTH DAILY 90 tablet 1   No current facility-administered medications for this visit.     Allergies:   Ace inhibitors; Codeine; Demerol [meperidine]; Erythromycin; Iodinated diagnostic agents; Other; and Penicillins    Social History:  The patient  reports that she quit smoking about 9 months ago. Her smoking use included Cigarettes and E-cigarettes. She has a 27.50 pack-year smoking history. She  has never used smokeless tobacco. She reports that she drinks alcohol. She reports that she does not use drugs.   Family History:  The patient's family history includes Hyperlipidemia in her sister; Hypertension in her sister; Macular degeneration in her sister.    ROS:  Please see the history of present illness.   Otherwise, review of systems are positive for none.   All other systems are reviewed and negative.    PHYSICAL EXAM: VS:  BP 140/70 (BP Location: Left Arm, Patient Position: Sitting, Cuff Size: Normal)   Pulse 66   Ht 5\' 2"  (1.575 m)   Wt 158 lb 8 oz (71.9 kg)   BMI 28.99 kg/m  , BMI Body mass index is 28.99 kg/m. GEN: Well nourished, well developed, in no acute distress  HEENT: normal  Neck: no JVD, carotid bruits, or masses Cardiac: RRR; no murmurs, rubs, or gallops,no edema  Respiratory:  clear to auscultation bilaterally, normal work of breathing GI: soft, nontender, nondistended, + BS MS: no deformity or atrophy  Skin: warm and dry, no rash Neuro:  Strength and sensation are intact Psych: euthymic mood, full affect Posterior tibial pulses palpable bilaterally.  EKG:  EKG is ordered today. The ekg ordered today demonstrates normal sinus rhythm, PACs, with anterior T wave changes suggestive of ischemia. These are not different from prior EKG.   Recent Labs: 12/04/2015: BUN 16; Creatinine, Ser 0.77; Hemoglobin 15.2; Platelets 261; Potassium 3.9; Sodium 135 02/06/2016: ALT 23    Lipid Panel    Component Value Date/Time   CHOL 149 02/06/2016 0953   TRIG 155 (H) 02/06/2016 0953   HDL 54 02/06/2016 0953   CHOLHDL 2.8 02/06/2016 0953   CHOLHDL 4 05/17/2015 1442   VLDL 57.6 (H) 05/17/2015 1442   LDLCALC 64 02/06/2016 0953   LDLDIRECT 88.0 05/17/2015 1442      Wt Readings from Last 3 Encounters:  09/23/16 158 lb 8 oz (71.9 kg)  07/31/16 156 lb (70.8 kg)  03/22/16 156 lb (70.8 kg)       ASSESSMENT AND PLAN:  1.  Coronary artery disease involving native  coronary arteries without angina: She is known to chronically occluded LAD which is being treated medically. Continue medical therapy.  2. Hyperlipidemia: Continue treatment with high dose atorvastatin. Most recent lipid profile showed improvement in LDL to 64 which is at target  3. Essential hypertension: Blood pressure is borderline elevated. Continue to monitor and we can consider adding an ACE inhibitor or ARB in the future if needed.  4. Tobacco use:  She almost quit completely.  Disposition:   FU with me in 6 months.  Signed,  Kathlyn Sacramento, MD  09/23/2016 3:19 PM    [place]

## 2016-09-27 DIAGNOSIS — L905 Scar conditions and fibrosis of skin: Secondary | ICD-10-CM | POA: Diagnosis not present

## 2016-09-27 DIAGNOSIS — Z85828 Personal history of other malignant neoplasm of skin: Secondary | ICD-10-CM | POA: Diagnosis not present

## 2016-09-27 DIAGNOSIS — Z08 Encounter for follow-up examination after completed treatment for malignant neoplasm: Secondary | ICD-10-CM | POA: Diagnosis not present

## 2016-09-27 DIAGNOSIS — D485 Neoplasm of uncertain behavior of skin: Secondary | ICD-10-CM | POA: Diagnosis not present

## 2016-09-27 DIAGNOSIS — D0462 Carcinoma in situ of skin of left upper limb, including shoulder: Secondary | ICD-10-CM | POA: Diagnosis not present

## 2016-09-27 DIAGNOSIS — L570 Actinic keratosis: Secondary | ICD-10-CM | POA: Diagnosis not present

## 2016-09-27 DIAGNOSIS — C44722 Squamous cell carcinoma of skin of right lower limb, including hip: Secondary | ICD-10-CM | POA: Diagnosis not present

## 2016-10-24 DIAGNOSIS — D0472 Carcinoma in situ of skin of left lower limb, including hip: Secondary | ICD-10-CM | POA: Diagnosis not present

## 2016-10-24 DIAGNOSIS — L570 Actinic keratosis: Secondary | ICD-10-CM | POA: Diagnosis not present

## 2016-10-24 DIAGNOSIS — D0462 Carcinoma in situ of skin of left upper limb, including shoulder: Secondary | ICD-10-CM | POA: Diagnosis not present

## 2016-10-24 DIAGNOSIS — D0471 Carcinoma in situ of skin of right lower limb, including hip: Secondary | ICD-10-CM | POA: Diagnosis not present

## 2016-10-25 DIAGNOSIS — M5416 Radiculopathy, lumbar region: Secondary | ICD-10-CM | POA: Diagnosis not present

## 2016-10-25 DIAGNOSIS — M7061 Trochanteric bursitis, right hip: Secondary | ICD-10-CM | POA: Diagnosis not present

## 2016-10-25 DIAGNOSIS — M7062 Trochanteric bursitis, left hip: Secondary | ICD-10-CM | POA: Diagnosis not present

## 2016-10-25 DIAGNOSIS — M5136 Other intervertebral disc degeneration, lumbar region: Secondary | ICD-10-CM | POA: Diagnosis not present

## 2016-10-30 DIAGNOSIS — Z85828 Personal history of other malignant neoplasm of skin: Secondary | ICD-10-CM | POA: Diagnosis not present

## 2016-10-30 DIAGNOSIS — C44722 Squamous cell carcinoma of skin of right lower limb, including hip: Secondary | ICD-10-CM | POA: Diagnosis not present

## 2016-11-08 ENCOUNTER — Other Ambulatory Visit: Payer: Self-pay | Admitting: Cardiovascular Disease

## 2016-12-12 DIAGNOSIS — C44722 Squamous cell carcinoma of skin of right lower limb, including hip: Secondary | ICD-10-CM | POA: Diagnosis not present

## 2016-12-12 DIAGNOSIS — C44622 Squamous cell carcinoma of skin of right upper limb, including shoulder: Secondary | ICD-10-CM | POA: Diagnosis not present

## 2016-12-12 DIAGNOSIS — D485 Neoplasm of uncertain behavior of skin: Secondary | ICD-10-CM | POA: Diagnosis not present

## 2016-12-12 DIAGNOSIS — C44729 Squamous cell carcinoma of skin of left lower limb, including hip: Secondary | ICD-10-CM | POA: Diagnosis not present

## 2016-12-16 DIAGNOSIS — M5416 Radiculopathy, lumbar region: Secondary | ICD-10-CM | POA: Diagnosis not present

## 2016-12-16 DIAGNOSIS — M5136 Other intervertebral disc degeneration, lumbar region: Secondary | ICD-10-CM | POA: Diagnosis not present

## 2017-01-01 DIAGNOSIS — M5416 Radiculopathy, lumbar region: Secondary | ICD-10-CM | POA: Diagnosis not present

## 2017-01-01 DIAGNOSIS — J0100 Acute maxillary sinusitis, unspecified: Secondary | ICD-10-CM | POA: Diagnosis not present

## 2017-01-01 DIAGNOSIS — D0471 Carcinoma in situ of skin of right lower limb, including hip: Secondary | ICD-10-CM | POA: Diagnosis not present

## 2017-01-01 DIAGNOSIS — R1031 Right lower quadrant pain: Secondary | ICD-10-CM | POA: Diagnosis not present

## 2017-01-01 DIAGNOSIS — I10 Essential (primary) hypertension: Secondary | ICD-10-CM | POA: Diagnosis not present

## 2017-01-01 DIAGNOSIS — M25551 Pain in right hip: Secondary | ICD-10-CM | POA: Diagnosis not present

## 2017-01-01 DIAGNOSIS — I2510 Atherosclerotic heart disease of native coronary artery without angina pectoris: Secondary | ICD-10-CM | POA: Diagnosis not present

## 2017-01-01 DIAGNOSIS — E782 Mixed hyperlipidemia: Secondary | ICD-10-CM | POA: Diagnosis not present

## 2017-01-01 DIAGNOSIS — I2583 Coronary atherosclerosis due to lipid rich plaque: Secondary | ICD-10-CM | POA: Diagnosis not present

## 2017-01-01 DIAGNOSIS — Z780 Asymptomatic menopausal state: Secondary | ICD-10-CM | POA: Diagnosis not present

## 2017-01-07 DIAGNOSIS — M8588 Other specified disorders of bone density and structure, other site: Secondary | ICD-10-CM | POA: Diagnosis not present

## 2017-01-08 DIAGNOSIS — G459 Transient cerebral ischemic attack, unspecified: Secondary | ICD-10-CM | POA: Diagnosis not present

## 2017-01-08 DIAGNOSIS — R531 Weakness: Secondary | ICD-10-CM | POA: Diagnosis not present

## 2017-01-08 DIAGNOSIS — R202 Paresthesia of skin: Secondary | ICD-10-CM | POA: Diagnosis not present

## 2017-01-09 ENCOUNTER — Other Ambulatory Visit: Payer: Self-pay | Admitting: Physician Assistant

## 2017-01-09 DIAGNOSIS — G459 Transient cerebral ischemic attack, unspecified: Secondary | ICD-10-CM

## 2017-01-14 DIAGNOSIS — C44722 Squamous cell carcinoma of skin of right lower limb, including hip: Secondary | ICD-10-CM | POA: Diagnosis not present

## 2017-01-14 DIAGNOSIS — Z85828 Personal history of other malignant neoplasm of skin: Secondary | ICD-10-CM | POA: Diagnosis not present

## 2017-01-17 ENCOUNTER — Ambulatory Visit
Admission: RE | Admit: 2017-01-17 | Discharge: 2017-01-17 | Disposition: A | Discharge status: Home / Self Care | Payer: PPO | Source: Ambulatory Visit | Attending: Physician Assistant | Admitting: Physician Assistant

## 2017-01-17 DIAGNOSIS — R531 Weakness: Secondary | ICD-10-CM | POA: Diagnosis present

## 2017-01-17 DIAGNOSIS — G459 Transient cerebral ischemic attack, unspecified: Secondary | ICD-10-CM | POA: Diagnosis not present

## 2017-01-17 DIAGNOSIS — G9389 Other specified disorders of brain: Secondary | ICD-10-CM | POA: Insufficient documentation

## 2017-01-17 DIAGNOSIS — R202 Paresthesia of skin: Secondary | ICD-10-CM | POA: Diagnosis present

## 2017-01-17 MED ORDER — GADOBENATE DIMEGLUMINE 529 MG/ML IV SOLN
15.0000 mL | Freq: Once | INTRAVENOUS | Status: AC | PRN
  Administered 2017-01-17: 14 mL via INTRAVENOUS

## 2017-01-18 LAB — POCT I-STAT CREATININE: CREATININE: 0.9 mg/dL (ref 0.44–1.00)

## 2017-01-21 DIAGNOSIS — D329 Benign neoplasm of meninges, unspecified: Secondary | ICD-10-CM | POA: Diagnosis not present

## 2017-01-27 DIAGNOSIS — M5136 Other intervertebral disc degeneration, lumbar region: Secondary | ICD-10-CM | POA: Diagnosis not present

## 2017-01-27 DIAGNOSIS — M5416 Radiculopathy, lumbar region: Secondary | ICD-10-CM | POA: Diagnosis not present

## 2017-01-27 DIAGNOSIS — M7061 Trochanteric bursitis, right hip: Secondary | ICD-10-CM | POA: Diagnosis not present

## 2017-01-27 DIAGNOSIS — M7062 Trochanteric bursitis, left hip: Secondary | ICD-10-CM | POA: Diagnosis not present

## 2017-01-30 DIAGNOSIS — C44729 Squamous cell carcinoma of skin of left lower limb, including hip: Secondary | ICD-10-CM | POA: Diagnosis not present

## 2017-01-30 DIAGNOSIS — C44622 Squamous cell carcinoma of skin of right upper limb, including shoulder: Secondary | ICD-10-CM | POA: Diagnosis not present

## 2017-02-11 DIAGNOSIS — I6522 Occlusion and stenosis of left carotid artery: Secondary | ICD-10-CM | POA: Diagnosis not present

## 2017-02-11 DIAGNOSIS — D329 Benign neoplasm of meninges, unspecified: Secondary | ICD-10-CM | POA: Diagnosis not present

## 2017-02-11 DIAGNOSIS — K5909 Other constipation: Secondary | ICD-10-CM | POA: Diagnosis not present

## 2017-02-11 DIAGNOSIS — Z86010 Personal history of colonic polyps: Secondary | ICD-10-CM | POA: Diagnosis not present

## 2017-02-27 DIAGNOSIS — R001 Bradycardia, unspecified: Secondary | ICD-10-CM | POA: Diagnosis not present

## 2017-02-27 DIAGNOSIS — D329 Benign neoplasm of meninges, unspecified: Secondary | ICD-10-CM | POA: Diagnosis not present

## 2017-02-27 DIAGNOSIS — I10 Essential (primary) hypertension: Secondary | ICD-10-CM | POA: Diagnosis not present

## 2017-03-04 DIAGNOSIS — M5136 Other intervertebral disc degeneration, lumbar region: Secondary | ICD-10-CM | POA: Diagnosis not present

## 2017-03-04 DIAGNOSIS — M5416 Radiculopathy, lumbar region: Secondary | ICD-10-CM | POA: Diagnosis not present

## 2017-03-13 DIAGNOSIS — C719 Malignant neoplasm of brain, unspecified: Secondary | ICD-10-CM

## 2017-03-13 HISTORY — DX: Malignant neoplasm of brain, unspecified: C71.9

## 2017-03-17 DIAGNOSIS — D329 Benign neoplasm of meninges, unspecified: Secondary | ICD-10-CM | POA: Diagnosis not present

## 2017-03-17 DIAGNOSIS — E782 Mixed hyperlipidemia: Secondary | ICD-10-CM | POA: Diagnosis not present

## 2017-03-17 DIAGNOSIS — I10 Essential (primary) hypertension: Secondary | ICD-10-CM | POA: Diagnosis not present

## 2017-03-17 DIAGNOSIS — R001 Bradycardia, unspecified: Secondary | ICD-10-CM | POA: Insufficient documentation

## 2017-03-17 DIAGNOSIS — Z01818 Encounter for other preprocedural examination: Secondary | ICD-10-CM | POA: Diagnosis not present

## 2017-03-17 DIAGNOSIS — F4323 Adjustment disorder with mixed anxiety and depressed mood: Secondary | ICD-10-CM | POA: Diagnosis not present

## 2017-03-17 DIAGNOSIS — I2583 Coronary atherosclerosis due to lipid rich plaque: Secondary | ICD-10-CM | POA: Diagnosis not present

## 2017-03-17 DIAGNOSIS — I2510 Atherosclerotic heart disease of native coronary artery without angina pectoris: Secondary | ICD-10-CM | POA: Diagnosis not present

## 2017-03-17 DIAGNOSIS — K589 Irritable bowel syndrome without diarrhea: Secondary | ICD-10-CM | POA: Diagnosis not present

## 2017-03-25 ENCOUNTER — Ambulatory Visit: Payer: PPO | Admitting: Internal Medicine

## 2017-03-25 ENCOUNTER — Encounter: Payer: Self-pay | Admitting: Internal Medicine

## 2017-03-25 VITALS — BP 140/72 | HR 65 | Ht 62.0 in | Wt 157.5 lb

## 2017-03-25 DIAGNOSIS — E785 Hyperlipidemia, unspecified: Secondary | ICD-10-CM

## 2017-03-25 DIAGNOSIS — I25118 Atherosclerotic heart disease of native coronary artery with other forms of angina pectoris: Secondary | ICD-10-CM

## 2017-03-25 DIAGNOSIS — Z01810 Encounter for preprocedural cardiovascular examination: Secondary | ICD-10-CM

## 2017-03-25 DIAGNOSIS — I10 Essential (primary) hypertension: Secondary | ICD-10-CM

## 2017-03-25 DIAGNOSIS — Z85828 Personal history of other malignant neoplasm of skin: Secondary | ICD-10-CM | POA: Diagnosis not present

## 2017-03-25 DIAGNOSIS — C44722 Squamous cell carcinoma of skin of right lower limb, including hip: Secondary | ICD-10-CM | POA: Diagnosis not present

## 2017-03-25 NOTE — Patient Instructions (Signed)

## 2017-03-25 NOTE — Progress Notes (Signed)
Follow-up Outpatient Visit Date: 03/25/2017  Primary Care Provider: Marinda Elk, MD [place] [address]  Chief Complaint: Follow-up coronary artery disease and pre-operative cardiac clearance  HPI:  Susan Mcmillan is a 71 y.o. year-old female with history of coronary artery disease with chronic total occlusion of the mid LAD and moderate RCA disease, ischemic cardiomyopathy with LVEF of 50-55%, hypertension, hyperlipidemia, and tobacco use, who presents for follow-up of Neri artery disease.  She is typically followed by Dr. Fletcher Anon but presents today for preop cardiovascular evaluation in anticipation of meningioma resection in 2 days at [facility].  Since her last visit with Dr. Fletcher Anon May, Susan Mcmillan has been doing well from a cardiac standpoint.  She denies chest pain, shortness of breath, palpitations, lightheadedness, and edema.  She does not exercise regularly but is able to do all activities around the house and walk without any difficulty.  Her only complaint is of intermittent right-sided weakness which has been attributed to her meningioma.  She is tolerating her medications well with the exception of incidentally noted sinus bradycardia.  This has been attributed to her metoprolol, which she has been taking for quite some time.  --------------------------------------------------------------------------------------------------  Past Medical History:  Diagnosis Date  . CAD (coronary artery disease)    a. 11/2015 MV: mod mid-distal ant ischemia, EF 58% w/ apical HK;  b. 11/2015 Cath: LM nl, LAD 80/147m (fills vial collats from RPDA & D1), D1 60ost, LCX small, nl, RCA 50p/m, RPDA nl, RPAV min irregs, RPl1/2/3 min irregs, EF 50-55%.  . Dysuria   . History of kidney stones   . Hyperlipemia   . Hypertensive heart disease   . IBS (irritable bowel syndrome)   . Pernicious anemia   . Personal history of tobacco use, presenting hazards to health  07/10/2015  . 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 Skin cancer   . Tobacco abuse    a. quit 11/2015.  Marland Kitchen Urethral caruncle   . Vulvar adhesions 10/06/2013   perianal skin bridge noted at posterior fourchette and region of WLE surgical site  . Vulvar leukoplakia   . Vulvar pain    Past Surgical History:  Procedure Laterality Date  . ABDOMINAL HERNIA REPAIR  2014   Dr. Rochel Brome  . ABDOMINAL HYSTERECTOMY  1978  . APPENDECTOMY    . CERVICAL BIOPSY     Dr. Enzo Bi  . CHOLECYSTECTOMY    . wide local excision  07/2013   vin 3 w/ margins involoved    Current Meds  Medication Sig  . ALPRAZolam (XANAX) 0.25 MG tablet Take 2 tablets (0.5 mg total) by mouth at bedtime as needed. (Patient taking differently: Take 0.25 mg by mouth at bedtime as needed for sleep. )  . aspirin 81 MG tablet Take 81 mg by mouth daily.  Marland Kitchen atorvastatin (LIPITOR) 80 MG tablet Take 1 tablet (80 mg total) by mouth daily.  . cyanocobalamin (,VITAMIN B-12,) 1000 MCG/ML injection INJECT 1 ML INTO THE MUSCLE EVERY 30 (THIRTY) DAYS.  Marland Kitchen FLUoxetine (PROZAC) 20 MG capsule TAKE 1 CAPSULE BY MOUTH EVERY DAY  . folic acid (FOLVITE) 124 MCG tablet Take 800 mcg by mouth daily.   Marland Kitchen gabapentin (NEURONTIN) 300 MG capsule Take 100-300 mg by mouth See admin instructions. 100 mg every morning and 300 mg every evening  . loratadine (ALLERGY) 10 MG tablet Take 10 mg by mouth daily as needed for allergies.  . metoprolol  succinate (TOPROL-XL) 50 MG 24 hr tablet TAKE 1 TABLET BY MOUTH DAILY WITH OR IMMEDIATELY FOLLOWING MEAL  . triamterene-hydrochlorothiazide (MAXZIDE-25) 37.5-25 MG tablet TAKE 1/2 TAB BY MOUTH DAILY    Allergies: Ace inhibitors; Codeine; Demerol [meperidine]; Erythromycin; Iodinated diagnostic agents; Other; and Penicillins  Social History   Socioeconomic History  . Marital status: Married    Spouse name: Not on file  . Number of children: Not on file  . Years of education: Not on file  .  Highest education level: Not on file  Social Needs  . Financial resource strain: Not on file  . Food insecurity - worry: Not on file  . Food insecurity - inability: Not on file  . Transportation needs - medical: Not on file  . Transportation needs - non-medical: Not on file  Occupational History  . Not on file  Tobacco Use  . Smoking status: Former Smoker    Packs/day: 0.50    Years: 55.00    Pack years: 27.50    Types: Cigarettes, E-cigarettes    Last attempt to quit: 12/07/2015    Years since quitting: 1.2  . Smokeless tobacco: Never Used  Substance and Sexual Activity  . Alcohol use: Yes    Alcohol/week: 0.0 oz    Comment: occasion  . Drug use: No  . Sexual activity: Not Currently  Other Topics Concern  . Not on file  Social History Narrative   Lives in [place]. Previously worked [employer]. Husband with dementia. Children 2 sons.    Family History  Problem Relation Age of Onset  . Hyperlipidemia Sister   . Hypertension Sister   . Macular degeneration Sister   . Diabetes Neg Hx   . Heart disease Neg Hx   . Cancer Neg Hx     Review of Systems: A 12-system review of systems was performed and was negative except as noted in the HPI.  --------------------------------------------------------------------------------------------------  Physical Exam: BP 140/72 (BP Location: Left Arm, Patient Position: Sitting, Cuff Size: Normal)   Pulse 65   Ht 5\' 2"  (1.575 m)   Wt 157 lb 8 oz (71.4 kg)   BMI 28.81 kg/m   General: Overweight woman, seated comfortably in the exam room. HEENT: No conjunctival pallor or scleral icterus. Moist mucous membranes.  OP clear. Neck: Supple without lymphadenopathy, thyromegaly, JVD, or HJR. No carotid bruit. Lungs: Normal work of breathing. Clear to auscultation bilaterally without wheezes or crackles. Heart: Regular rate and rhythm without murmurs, rubs, or gallops. Non-displaced PMI. Abd: Bowel sounds present. Soft, NT/ND without  hepatosplenomegaly Ext: No lower extremity edema. Radial, PT, and DP pulses are 2+ bilaterally. Skin: Warm and dry without rash.  EKG: Normal sinus rhythm with baseline artifact.  Nonspecific ST segment changes and anterolateral T wave inversions are similar to previous tracings.  Lab Results  Component Value Date   WBC 6.8 12/04/2015   HGB 15.2 12/04/2015   HCT 44.8 12/04/2015   MCV 94.2 12/04/2015   PLT 261 12/04/2015    Lab Results  Component Value Date   NA 135 12/04/2015   K 3.9 12/04/2015   CL 100 (L) 12/04/2015   CO2 25 12/04/2015   BUN 16 12/04/2015   CREATININE 0.90 01/17/2017   GLUCOSE 92 12/04/2015   ALT 23 02/06/2016    Lab Results  Component Value Date   CHOL 149 02/06/2016   HDL 54 02/06/2016   LDLCALC 64 02/06/2016   LDLDIRECT 88.0 05/17/2015   TRIG 155 (H) 02/06/2016   CHOLHDL 2.8  02/06/2016   --------------------------------------------------------------------------------------------------  ASSESSMENT AND PLAN: Coronary artery disease with stable angina Susan Mcmillan has not had a significant chest pain on current medical therapy consisting of metoprolol succinate 50 mg daily.  Sinus bradycardia has been noted at times, though as she is asymptomatic, I recommend continuing her current dose of metoprolol.  She should continue with aggressive secondary prevention, including high intensity statin therapy and low-dose aspirin (aspirin currently on hold in anticipation of upcoming meningioma surgery).  Hypertension Blood pressure is borderline elevated today similar to her last visit in May.  In the setting of her upcoming surgery, we will defer any changes to Susan Mcmillan's medication regimen.  She should continue with metoprolol and triamterene-HCTZ.  If her blood pressure remains elevated, addition of an ARB or amlodipine could be considered.  Hyperlipidemia Most recent lipid panel at [facility] in 08/2016 showed an LDL of 57 (goal less than 70).  Susan Mcmillan should  continue with atorvastatin 80 mg daily.  Preoperative cardiovascular risk assessment Susan Mcmillan is scheduled for craniotomy and excision of meningioma.  She does not have any unstable cardiac symptoms.  She is able to complete at least 4 METS of activity without chest pain or significant dyspnea.  While her perioperative cardiovascular risk is above average, it is still below 1% based on the ACS/NSQIP combined clinical and surgical risk calculator.  No further cardiac testing or intervention is warranted at this time, as it will not further mitigate the patient's perioperative cardiovascular risk.  Follow-up: Return to clinic in 6 months with Dr. Fletcher Anon.  Nelva Bush, MD 03/25/2017 4:01 PM

## 2017-03-27 DIAGNOSIS — R001 Bradycardia, unspecified: Secondary | ICD-10-CM | POA: Diagnosis not present

## 2017-03-27 DIAGNOSIS — I2583 Coronary atherosclerosis due to lipid rich plaque: Secondary | ICD-10-CM | POA: Diagnosis not present

## 2017-03-27 DIAGNOSIS — I2510 Atherosclerotic heart disease of native coronary artery without angina pectoris: Secondary | ICD-10-CM | POA: Diagnosis not present

## 2017-03-27 DIAGNOSIS — C8511 Unspecified B-cell lymphoma, lymph nodes of head, face, and neck: Secondary | ICD-10-CM | POA: Diagnosis not present

## 2017-03-27 DIAGNOSIS — C8519 Unspecified B-cell lymphoma, extranodal and solid organ sites: Secondary | ICD-10-CM | POA: Diagnosis not present

## 2017-03-27 DIAGNOSIS — D329 Benign neoplasm of meninges, unspecified: Secondary | ICD-10-CM | POA: Diagnosis not present

## 2017-03-27 DIAGNOSIS — Z85828 Personal history of other malignant neoplasm of skin: Secondary | ICD-10-CM | POA: Diagnosis not present

## 2017-03-27 DIAGNOSIS — C8589 Other specified types of non-Hodgkin lymphoma, extranodal and solid organ sites: Secondary | ICD-10-CM | POA: Diagnosis not present

## 2017-03-27 DIAGNOSIS — R4182 Altered mental status, unspecified: Secondary | ICD-10-CM | POA: Diagnosis not present

## 2017-03-27 DIAGNOSIS — K219 Gastro-esophageal reflux disease without esophagitis: Secondary | ICD-10-CM | POA: Diagnosis not present

## 2017-03-27 DIAGNOSIS — F17210 Nicotine dependence, cigarettes, uncomplicated: Secondary | ICD-10-CM | POA: Diagnosis not present

## 2017-03-27 DIAGNOSIS — Z9049 Acquired absence of other specified parts of digestive tract: Secondary | ICD-10-CM | POA: Diagnosis not present

## 2017-03-27 DIAGNOSIS — K589 Irritable bowel syndrome without diarrhea: Secondary | ICD-10-CM | POA: Diagnosis not present

## 2017-03-27 DIAGNOSIS — M7061 Trochanteric bursitis, right hip: Secondary | ICD-10-CM | POA: Diagnosis not present

## 2017-03-27 DIAGNOSIS — E782 Mixed hyperlipidemia: Secondary | ICD-10-CM | POA: Diagnosis not present

## 2017-03-27 DIAGNOSIS — Z90710 Acquired absence of both cervix and uterus: Secondary | ICD-10-CM | POA: Diagnosis not present

## 2017-03-27 DIAGNOSIS — M5010 Cervical disc disorder with radiculopathy, unspecified cervical region: Secondary | ICD-10-CM | POA: Diagnosis not present

## 2017-03-27 DIAGNOSIS — I4891 Unspecified atrial fibrillation: Secondary | ICD-10-CM | POA: Diagnosis not present

## 2017-03-27 DIAGNOSIS — M5136 Other intervertebral disc degeneration, lumbar region: Secondary | ICD-10-CM | POA: Diagnosis not present

## 2017-03-27 DIAGNOSIS — I10 Essential (primary) hypertension: Secondary | ICD-10-CM | POA: Diagnosis not present

## 2017-03-27 DIAGNOSIS — F4323 Adjustment disorder with mixed anxiety and depressed mood: Secondary | ICD-10-CM | POA: Diagnosis not present

## 2017-03-27 DIAGNOSIS — Z87442 Personal history of urinary calculi: Secondary | ICD-10-CM | POA: Diagnosis not present

## 2017-04-07 DIAGNOSIS — C8589 Other specified types of non-Hodgkin lymphoma, extranodal and solid organ sites: Secondary | ICD-10-CM | POA: Diagnosis not present

## 2017-04-14 ENCOUNTER — Other Ambulatory Visit: Payer: Self-pay | Admitting: *Deleted

## 2017-04-14 ENCOUNTER — Encounter: Payer: Self-pay | Admitting: *Deleted

## 2017-04-14 DIAGNOSIS — C8590 Non-Hodgkin lymphoma, unspecified, unspecified site: Secondary | ICD-10-CM | POA: Diagnosis not present

## 2017-04-14 NOTE — Patient Outreach (Signed)
HTA High Risk pt screening call. Mrs. Pickney was only able to talk with me a moment. She informed me that she did have her craniotomy and that the diagnosis she had been given was lymphoma. She is still going through evaluations for the next step. I told her I would send her information about [facility] services and follow up with her in the next few weeks.  [name]. Myrtie Neither, MSN, [facility] Gerontological Nurse Practitioner [facility] Care Management [phone]

## 2017-04-17 DIAGNOSIS — I10 Essential (primary) hypertension: Secondary | ICD-10-CM | POA: Diagnosis not present

## 2017-04-17 DIAGNOSIS — D0471 Carcinoma in situ of skin of right lower limb, including hip: Secondary | ICD-10-CM | POA: Diagnosis not present

## 2017-04-17 DIAGNOSIS — C8590 Non-Hodgkin lymphoma, unspecified, unspecified site: Secondary | ICD-10-CM | POA: Diagnosis not present

## 2017-04-17 DIAGNOSIS — J069 Acute upper respiratory infection, unspecified: Secondary | ICD-10-CM | POA: Diagnosis not present

## 2017-04-17 DIAGNOSIS — D329 Benign neoplasm of meninges, unspecified: Secondary | ICD-10-CM | POA: Diagnosis not present

## 2017-04-21 ENCOUNTER — Other Ambulatory Visit: Payer: Self-pay | Admitting: *Deleted

## 2017-04-21 ENCOUNTER — Encounter: Payer: Self-pay | Admitting: *Deleted

## 2017-04-21 NOTE — Patient Outreach (Signed)
THN HTA High risk screening. I was able to speak with Susan Mcmillan today. She is doing fair. She has had a respiratory illness and this is resolving slowly. 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 She is to have a PET Scan on Wednesday this week. She reveals she does not want to have aggressive care at this time and is in the process of talking to her family about end of life wishes. I told her I would stay in contact with her and advocate for her in any way that I can. I advised I will call her again next Monday to follow up.  [name]. Myrtie Neither, MSN, [facility] Gerontological Nurse Practitioner [facility] Care Management [phone]

## 2017-04-23 DIAGNOSIS — C8599 Non-Hodgkin lymphoma, unspecified, extranodal and solid organ sites: Secondary | ICD-10-CM | POA: Diagnosis not present

## 2017-04-23 DIAGNOSIS — C8590 Non-Hodgkin lymphoma, unspecified, unspecified site: Secondary | ICD-10-CM | POA: Diagnosis not present

## 2017-04-23 DIAGNOSIS — C8330 Diffuse large B-cell lymphoma, unspecified site: Secondary | ICD-10-CM | POA: Diagnosis not present

## 2017-04-28 ENCOUNTER — Other Ambulatory Visit: Payer: Self-pay | Admitting: *Deleted

## 2017-04-28 NOTE — Patient Outreach (Signed)
Telephone follow up regarding CT scan. Mrs. Cassaday reports she did have the scan on Wednesday and will be seeing her provider on Wednesday this week to find out the results and plan. I asked if there is anything else I can do for her at this time and she denies any needs except prayers. I advised her she can call me for any problem or just to vent her feelings.   I will call her on Wednesday evening to find out the results.  [name]. Myrtie Neither, MSN, [facility] Gerontological Nurse Practitioner [facility] Care Management [phone]

## 2017-04-30 ENCOUNTER — Ambulatory Visit: Payer: Self-pay | Admitting: *Deleted

## 2017-04-30 DIAGNOSIS — M5416 Radiculopathy, lumbar region: Secondary | ICD-10-CM | POA: Diagnosis not present

## 2017-04-30 DIAGNOSIS — M7061 Trochanteric bursitis, right hip: Secondary | ICD-10-CM | POA: Diagnosis not present

## 2017-04-30 DIAGNOSIS — M5136 Other intervertebral disc degeneration, lumbar region: Secondary | ICD-10-CM | POA: Diagnosis not present

## 2017-04-30 DIAGNOSIS — M7062 Trochanteric bursitis, left hip: Secondary | ICD-10-CM | POA: Diagnosis not present

## 2017-05-01 DIAGNOSIS — Z114 Encounter for screening for human immunodeficiency virus [HIV]: Secondary | ICD-10-CM | POA: Diagnosis not present

## 2017-05-01 DIAGNOSIS — C8510 Unspecified B-cell lymphoma, unspecified site: Secondary | ICD-10-CM | POA: Diagnosis not present

## 2017-05-09 DIAGNOSIS — H2513 Age-related nuclear cataract, bilateral: Secondary | ICD-10-CM | POA: Diagnosis not present

## 2017-05-14 ENCOUNTER — Other Ambulatory Visit: Payer: Self-pay | Admitting: *Deleted

## 2017-05-14 NOTE — Patient Outreach (Signed)
Follow up phone call. Pt reports the results of her PET scan did NOT show any evidence of metastasis. She reports she is going to have another MRI to look at her spinal cord to rule out any chances of central nervous system metastasis. I will call her again next week to follow up. She denies any care management needs at this time.  Susan Mcmillan. Myrtie Neither, MSN, [facility] Gerontological Nurse Practitioner [facility] Care Management [phone]

## 2017-05-15 DIAGNOSIS — C8510 Unspecified B-cell lymphoma, unspecified site: Secondary | ICD-10-CM | POA: Diagnosis not present

## 2017-05-15 DIAGNOSIS — K449 Diaphragmatic hernia without obstruction or gangrene: Secondary | ICD-10-CM | POA: Diagnosis not present

## 2017-05-15 DIAGNOSIS — M4802 Spinal stenosis, cervical region: Secondary | ICD-10-CM | POA: Diagnosis not present

## 2017-05-15 DIAGNOSIS — M50223 Other cervical disc displacement at C6-C7 level: Secondary | ICD-10-CM | POA: Diagnosis not present

## 2017-05-16 DIAGNOSIS — C8589 Other specified types of non-Hodgkin lymphoma, extranodal and solid organ sites: Secondary | ICD-10-CM | POA: Diagnosis not present

## 2017-05-21 ENCOUNTER — Other Ambulatory Visit: Payer: Self-pay | Admitting: *Deleted

## 2017-05-22 DIAGNOSIS — C8589 Other specified types of non-Hodgkin lymphoma, extranodal and solid organ sites: Secondary | ICD-10-CM | POA: Diagnosis not present

## 2017-05-23 ENCOUNTER — Other Ambulatory Visit: Payer: Self-pay | Admitting: *Deleted

## 2017-05-23 NOTE — Patient Outreach (Signed)
Telephone assessment for high risk HTA patient. She reports she has had her radiation helmet made and she will start radiation treatment at [facility] for 2 and 1/2 weeks. She is generally feeling well. I encouraged her to do all the things she wants to do now as the course of her cancer is not certain. We chatted for 10 minutes and I offered support. I will call her during the first week of February to see how her treatment is progressing.  [name]. Myrtie Neither, MSN, [facility] Gerontological Nurse Practitioner [facility] Care Management [phone]

## 2017-05-28 DIAGNOSIS — C8589 Other specified types of non-Hodgkin lymphoma, extranodal and solid organ sites: Secondary | ICD-10-CM | POA: Diagnosis not present

## 2017-05-30 ENCOUNTER — Ambulatory Visit: Admit: 2017-05-30 | Payer: PPO | Admitting: Gastroenterology

## 2017-05-30 SURGERY — COLONOSCOPY WITH PROPOFOL
Anesthesia: General

## 2017-06-04 DIAGNOSIS — C8589 Other specified types of non-Hodgkin lymphoma, extranodal and solid organ sites: Secondary | ICD-10-CM | POA: Diagnosis not present

## 2017-06-04 DIAGNOSIS — D329 Benign neoplasm of meninges, unspecified: Secondary | ICD-10-CM | POA: Diagnosis not present

## 2017-06-05 DIAGNOSIS — C8589 Other specified types of non-Hodgkin lymphoma, extranodal and solid organ sites: Secondary | ICD-10-CM | POA: Diagnosis not present

## 2017-06-06 DIAGNOSIS — C8589 Other specified types of non-Hodgkin lymphoma, extranodal and solid organ sites: Secondary | ICD-10-CM | POA: Diagnosis not present

## 2017-06-09 DIAGNOSIS — C8589 Other specified types of non-Hodgkin lymphoma, extranodal and solid organ sites: Secondary | ICD-10-CM | POA: Diagnosis not present

## 2017-06-10 DIAGNOSIS — C8589 Other specified types of non-Hodgkin lymphoma, extranodal and solid organ sites: Secondary | ICD-10-CM | POA: Diagnosis not present

## 2017-06-11 DIAGNOSIS — D329 Benign neoplasm of meninges, unspecified: Secondary | ICD-10-CM | POA: Diagnosis not present

## 2017-06-11 DIAGNOSIS — C8589 Other specified types of non-Hodgkin lymphoma, extranodal and solid organ sites: Secondary | ICD-10-CM | POA: Diagnosis not present

## 2017-06-12 DIAGNOSIS — C8589 Other specified types of non-Hodgkin lymphoma, extranodal and solid organ sites: Secondary | ICD-10-CM | POA: Diagnosis not present

## 2017-06-13 DIAGNOSIS — C8589 Other specified types of non-Hodgkin lymphoma, extranodal and solid organ sites: Secondary | ICD-10-CM | POA: Diagnosis not present

## 2017-06-16 DIAGNOSIS — C8589 Other specified types of non-Hodgkin lymphoma, extranodal and solid organ sites: Secondary | ICD-10-CM | POA: Diagnosis not present

## 2017-06-17 DIAGNOSIS — C8589 Other specified types of non-Hodgkin lymphoma, extranodal and solid organ sites: Secondary | ICD-10-CM | POA: Diagnosis not present

## 2017-06-18 DIAGNOSIS — C8589 Other specified types of non-Hodgkin lymphoma, extranodal and solid organ sites: Secondary | ICD-10-CM | POA: Diagnosis not present

## 2017-06-19 DIAGNOSIS — C8589 Other specified types of non-Hodgkin lymphoma, extranodal and solid organ sites: Secondary | ICD-10-CM | POA: Diagnosis not present

## 2017-06-20 DIAGNOSIS — C8589 Other specified types of non-Hodgkin lymphoma, extranodal and solid organ sites: Secondary | ICD-10-CM | POA: Diagnosis not present

## 2017-07-03 DIAGNOSIS — M5136 Other intervertebral disc degeneration, lumbar region: Secondary | ICD-10-CM | POA: Diagnosis not present

## 2017-07-03 DIAGNOSIS — M5416 Radiculopathy, lumbar region: Secondary | ICD-10-CM | POA: Diagnosis not present

## 2017-07-23 ENCOUNTER — Other Ambulatory Visit: Payer: Self-pay | Admitting: *Deleted

## 2017-07-25 NOTE — Patient Outreach (Signed)
Mrs. Spizzirri states she is doing very well. She is finished with her radiation txs. She is scheduled to have a follow up MRI next week. She is loosing her hair and we discussed options she could consider for head covering if she desires. She is in very good spirits and she voiced her appreciation for the call. I encouraged her to remember me if she needs to talk about a problem, cry on someone's shoulder or vent about anything. I ensured she does have my number for future reference. I wished her well and continued improvements in her health.  [name]. Myrtie Neither, MSN, [facility] Gerontological Nurse Practitioner [facility] Care Management [phone]

## 2017-07-30 DIAGNOSIS — M7062 Trochanteric bursitis, left hip: Secondary | ICD-10-CM | POA: Diagnosis not present

## 2017-07-30 DIAGNOSIS — K121 Other forms of stomatitis: Secondary | ICD-10-CM | POA: Diagnosis not present

## 2017-07-30 DIAGNOSIS — C8510 Unspecified B-cell lymphoma, unspecified site: Secondary | ICD-10-CM | POA: Diagnosis not present

## 2017-07-30 DIAGNOSIS — M5136 Other intervertebral disc degeneration, lumbar region: Secondary | ICD-10-CM | POA: Diagnosis not present

## 2017-07-30 DIAGNOSIS — M7061 Trochanteric bursitis, right hip: Secondary | ICD-10-CM | POA: Diagnosis not present

## 2017-07-30 DIAGNOSIS — M5416 Radiculopathy, lumbar region: Secondary | ICD-10-CM | POA: Diagnosis not present

## 2017-07-31 DIAGNOSIS — F17210 Nicotine dependence, cigarettes, uncomplicated: Secondary | ICD-10-CM | POA: Diagnosis not present

## 2017-07-31 DIAGNOSIS — C8510 Unspecified B-cell lymphoma, unspecified site: Secondary | ICD-10-CM | POA: Diagnosis not present

## 2017-07-31 DIAGNOSIS — Z9889 Other specified postprocedural states: Secondary | ICD-10-CM | POA: Diagnosis not present

## 2017-08-14 DIAGNOSIS — Z85828 Personal history of other malignant neoplasm of skin: Secondary | ICD-10-CM | POA: Diagnosis not present

## 2017-08-14 DIAGNOSIS — L570 Actinic keratosis: Secondary | ICD-10-CM | POA: Diagnosis not present

## 2017-08-14 DIAGNOSIS — X32XXXA Exposure to sunlight, initial encounter: Secondary | ICD-10-CM | POA: Diagnosis not present

## 2017-08-14 DIAGNOSIS — L821 Other seborrheic keratosis: Secondary | ICD-10-CM | POA: Diagnosis not present

## 2017-08-14 DIAGNOSIS — D1801 Hemangioma of skin and subcutaneous tissue: Secondary | ICD-10-CM | POA: Diagnosis not present

## 2017-08-14 DIAGNOSIS — Z08 Encounter for follow-up examination after completed treatment for malignant neoplasm: Secondary | ICD-10-CM | POA: Diagnosis not present

## 2017-09-01 DIAGNOSIS — M5136 Other intervertebral disc degeneration, lumbar region: Secondary | ICD-10-CM | POA: Diagnosis not present

## 2017-09-01 DIAGNOSIS — M5416 Radiculopathy, lumbar region: Secondary | ICD-10-CM | POA: Diagnosis not present

## 2017-10-09 DIAGNOSIS — M5136 Other intervertebral disc degeneration, lumbar region: Secondary | ICD-10-CM | POA: Diagnosis not present

## 2017-10-09 DIAGNOSIS — M5416 Radiculopathy, lumbar region: Secondary | ICD-10-CM | POA: Diagnosis not present

## 2017-10-29 DIAGNOSIS — M5416 Radiculopathy, lumbar region: Secondary | ICD-10-CM | POA: Diagnosis not present

## 2017-10-29 DIAGNOSIS — M7061 Trochanteric bursitis, right hip: Secondary | ICD-10-CM | POA: Diagnosis not present

## 2017-10-29 DIAGNOSIS — M5136 Other intervertebral disc degeneration, lumbar region: Secondary | ICD-10-CM | POA: Diagnosis not present

## 2017-10-29 DIAGNOSIS — M7062 Trochanteric bursitis, left hip: Secondary | ICD-10-CM | POA: Diagnosis not present

## 2017-10-30 ENCOUNTER — Other Ambulatory Visit: Payer: Self-pay | Admitting: Cardiovascular Disease

## 2017-11-20 DIAGNOSIS — L570 Actinic keratosis: Secondary | ICD-10-CM | POA: Diagnosis not present

## 2017-11-20 DIAGNOSIS — D485 Neoplasm of uncertain behavior of skin: Secondary | ICD-10-CM | POA: Diagnosis not present

## 2017-11-20 DIAGNOSIS — Z08 Encounter for follow-up examination after completed treatment for malignant neoplasm: Secondary | ICD-10-CM | POA: Diagnosis not present

## 2017-11-20 DIAGNOSIS — Z85828 Personal history of other malignant neoplasm of skin: Secondary | ICD-10-CM | POA: Diagnosis not present

## 2017-11-20 DIAGNOSIS — C44722 Squamous cell carcinoma of skin of right lower limb, including hip: Secondary | ICD-10-CM | POA: Diagnosis not present

## 2017-12-04 DIAGNOSIS — R350 Frequency of micturition: Secondary | ICD-10-CM | POA: Diagnosis not present

## 2017-12-04 DIAGNOSIS — E279 Disorder of adrenal gland, unspecified: Secondary | ICD-10-CM | POA: Diagnosis not present

## 2017-12-04 DIAGNOSIS — C719 Malignant neoplasm of brain, unspecified: Secondary | ICD-10-CM | POA: Diagnosis not present

## 2017-12-04 DIAGNOSIS — C8510 Unspecified B-cell lymphoma, unspecified site: Secondary | ICD-10-CM | POA: Diagnosis not present

## 2017-12-23 DIAGNOSIS — C44722 Squamous cell carcinoma of skin of right lower limb, including hip: Secondary | ICD-10-CM | POA: Diagnosis not present

## 2017-12-23 DIAGNOSIS — Z85828 Personal history of other malignant neoplasm of skin: Secondary | ICD-10-CM | POA: Diagnosis not present

## 2017-12-31 DIAGNOSIS — C44729 Squamous cell carcinoma of skin of left lower limb, including hip: Secondary | ICD-10-CM | POA: Diagnosis not present

## 2018-01-01 DIAGNOSIS — M5136 Other intervertebral disc degeneration, lumbar region: Secondary | ICD-10-CM | POA: Diagnosis not present

## 2018-01-01 DIAGNOSIS — M5416 Radiculopathy, lumbar region: Secondary | ICD-10-CM | POA: Diagnosis not present

## 2018-01-08 ENCOUNTER — Ambulatory Visit: Payer: PPO | Admitting: Cardiovascular Disease

## 2018-01-28 DIAGNOSIS — M5416 Radiculopathy, lumbar region: Secondary | ICD-10-CM | POA: Diagnosis not present

## 2018-01-28 DIAGNOSIS — M5136 Other intervertebral disc degeneration, lumbar region: Secondary | ICD-10-CM | POA: Diagnosis not present

## 2018-01-28 DIAGNOSIS — M7061 Trochanteric bursitis, right hip: Secondary | ICD-10-CM | POA: Diagnosis not present

## 2018-01-28 DIAGNOSIS — M7062 Trochanteric bursitis, left hip: Secondary | ICD-10-CM | POA: Diagnosis not present

## 2018-01-29 ENCOUNTER — Ambulatory Visit: Payer: PPO | Admitting: Nurse Practitioner

## 2018-01-29 ENCOUNTER — Encounter: Payer: Self-pay | Admitting: Nurse Practitioner

## 2018-01-29 VITALS — BP 140/80 | HR 59 | Ht 62.0 in | Wt 156.5 lb

## 2018-01-29 DIAGNOSIS — I10 Essential (primary) hypertension: Secondary | ICD-10-CM

## 2018-01-29 DIAGNOSIS — I2510 Atherosclerotic heart disease of native coronary artery without angina pectoris: Secondary | ICD-10-CM | POA: Diagnosis not present

## 2018-01-29 DIAGNOSIS — E782 Mixed hyperlipidemia: Secondary | ICD-10-CM | POA: Diagnosis not present

## 2018-01-29 MED ORDER — ATORVASTATIN CALCIUM 80 MG PO TABS: 80.0000 mg | ORAL_TABLET | Freq: Every day | ORAL | 3 refills | Status: DC

## 2018-01-29 NOTE — Patient Instructions (Signed)
Medication Instructions:  Your physician recommends that you continue on your current medications as directed. Please refer to the Current Medication list given to you today.   Labwork: NONE  Testing/Procedures: NONE  Follow-Up: Your physician recommends that you schedule a follow-up appointment in: [place].   If you need a refill on your cardiac medications before your next appointment, please call your pharmacy.

## 2018-01-29 NOTE — Progress Notes (Signed)
Office Visit    Patient Name: NYSA SARIN Date of Encounter: 01/29/2018  Primary Care Provider:  Marinda Elk, MD Primary Cardiologist:  Kathlyn Sacramento, MD  Chief Complaint    72 year old female with a history of hypertension, hyperlipidemia, ischemic cardiomyopathy with an EF of 75 to 55%, tobacco abuse, chronic back and neck pain, and meningioma status post resection, who follows up today secondary history of CAD.  Past Medical History    Past Medical History:  Diagnosis Date  . CAD (coronary artery disease)    a. 11/2015 MV: mod mid-distal ant ischemia, EF 58% w/ apical HK;  b. 11/2015 Cath: LM nl, LAD 80/128m (fills vial collats from RPDA & D1), D1 60ost, LCX small, nl, RCA 50p/m, RPDA nl, RPAV min irregs, RPl1/2/3 min irregs, EF 50-55%.  . Dysuria   . History of kidney stones   . Hyperlipemia   . Hypertensive heart disease   . IBS (irritable bowel syndrome)   . Pernicious anemia   . Personal history of tobacco use, presenting hazards to health 07/10/2015  . 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 Skin cancer   . Tobacco abuse    a. Still smoking ~ 1 cigarette/day.  Marland Kitchen Urethral caruncle   . Vulvar adhesions 10/06/2013   perianal skin bridge noted at posterior fourchette and region of WLE surgical site  . Vulvar leukoplakia   . Vulvar pain    Past Surgical History:  Procedure Laterality Date  . ABDOMINAL HERNIA REPAIR  2014   Dr. Rochel Brome  . ABDOMINAL HYSTERECTOMY  1978  . APPENDECTOMY    . CARDIAC CATHETERIZATION Left 12/07/2015   Procedure: Left Heart Cath and Coronary Angiography;  Surgeon: Wellington Hampshire, MD;  Location: [facility];  Service: Cardiovascular;  Laterality: Left;  . CERVICAL BIOPSY     Dr. Enzo Bi  . CHOLECYSTECTOMY    . wide local excision  07/2013   vin 3 w/ margins involoved    Allergies  Allergies  Allergen Reactions  . Ace Inhibitors Other (See Comments)  . Codeine   . Demerol [Meperidine]  Other (See Comments)    Heavy sedation  . Erythromycin Nausea Only    "All Mycins cause nausea"  . Iodinated Diagnostic Agents Nausea And Vomiting  . Other Other (See Comments)  . Penicillins Nausea Only    Can do amoxicillin Has patient had a PCN reaction causing immediate rash, facial/tongue/throat swelling, SOB or lightheadedness with hypotension: Yes Has patient had a PCN reaction causing severe rash involving mucus membranes or skin necrosis: No Has patient had a PCN reaction that required hospitalization No Has patient had a PCN reaction occurring within the last 10 years: No If all of the above answers are "NO", then may proceed with Cephalosporin use.     History of Present Illness    72 year old female with a history of CAD, hypertension, hyperlipidemia, ischemic cardiomyopathy with an EF of 50 to 55%, tobacco abuse, chronic back and neck pain, and meningioma status post resection.  In March 2017, she underwent CT of the chest to follow-up pulmonary nodule and this instantly showed multivessel coronary at the sclerosis.  She followed up with Dr. Fletcher Anon and reported dyspnea on exertion.  She was also noted to have anterior T wave inversion on ECG and underwent stress testing which showed moderate mid to distal anterior ischemia with normal LV function and apical hypokinesis.  Diagnostic catheterization revealed a totally occluded mid  LAD with right to left collaterals and left to left collaterals.  She otherwise had moderate RCA and branch vessel disease.  She has been medically managed ever since.  She was last seen in cardiology clinic in November in the setting of left-sided meningioma with plan for resection.  She was cleared for surgery at that time and subsequently underwent successful resection shortly thereafter.  Since then, she has recovered well.  She is relatively active, walking regularly without chest pain or dyspnea.  She continues to smoke approximately 1 cigarette/day, saying  that she usually will take 2 puffs and then put it out but may have 2 more puffs later.  She is not interested in quitting at this time.  She denies PND, orthopnea, dizziness, syncope, edema, or early satiety.  Home Medications    Prior to Admission medications   Medication Sig Start Date End Date Taking? Authorizing Provider  ALPRAZolam Duanne Moron) 0.25 MG tablet Take 2 tablets (0.5 mg total) by mouth at bedtime as needed. Patient taking differently: Take 0.25 mg by mouth at bedtime as needed for sleep.  11/25/14   Jackolyn Confer, MD  aspirin 81 MG tablet Take 81 mg by mouth daily.    [provider]  atorvastatin (LIPITOR) 80 MG tablet TAKE 1 TABLET(80 MG) BY MOUTH DAILY 10/30/17   Wellington Hampshire, MD  cyanocobalamin (,VITAMIN B-12,) 1000 MCG/ML injection INJECT 1 ML INTO THE MUSCLE EVERY 30 (THIRTY) DAYS. 05/22/15   Jackolyn Confer, MD  FLUoxetine (PROZAC) 20 MG capsule TAKE 1 CAPSULE BY MOUTH EVERY DAY 01/25/16   Coral Spikes, DO  folic acid (FOLVITE) 510 MCG tablet Take 800 mcg by mouth daily.     [provider]  gabapentin (NEURONTIN) 300 MG capsule Take 100-300 mg by mouth See admin instructions. 100 mg every morning and 300 mg every evening 07/22/15   [provider]  loratadine (ALLERGY) 10 MG tablet Take 10 mg by mouth daily as needed for allergies.    [provider]  metoprolol succinate (TOPROL-XL) 50 MG 24 hr tablet TAKE 1 TABLET BY MOUTH DAILY WITH OR IMMEDIATELY FOLLOWING MEAL 11/20/15   Jackolyn Confer, MD  triamterene-hydrochlorothiazide (CHENIDP-82) 37.5-25 MG tablet TAKE 1/2 TAB BY MOUTH DAILY 01/16/16   Burnard Hawthorne, FNP    Review of Systems    She denies chest pain, palpitations, dyspnea, pnd, orthopnea, n, v, dizziness, syncope, edema, weight gain, or early satiety.  All other systems reviewed and are otherwise negative except as noted above.  Physical Exam    VS:  BP 140/80 (BP Location: Left Arm, Patient Position: Sitting,  Cuff Size: Normal)   Pulse (!) 59   Ht 5\' 2"  (1.575 m)   Wt 156 lb 8 oz (71 kg)   BMI 28.62 kg/m  , BMI Body mass index is 28.62 kg/m. GEN: Well nourished, well developed, in no acute distress. HEENT: normal. Neck: Supple, no JVD, carotid bruits, or masses. Cardiac: RRR, no murmurs, rubs, or gallops. No clubbing, cyanosis, edema.  PT 1+ and equal bilaterally.  Respiratory:  Respirations regular and unlabored, clear to auscultation bilaterally. GI: Soft, nontender, nondistended, BS + x 4. MS: no deformity or atrophy. Skin: warm and dry, no rash. Neuro:  Strength and sensation are intact. Psych: Normal affect.  Accessory Clinical Findings    ECG personally reviewed by me today - sinus arrhythmia, 59, antlat twi - no acute changes.  Assessment & Plan    1.  Coronary artery disease:  Patient has been stable over the past 10 months.  She does not experience chest pain or dyspnea.  She remains on aspirin, statin, and beta-blocker therapy.  2.  Essential hypertension: Blood pressure elevated today at 140/80.  She thinks this is higher than it normally runs but she does not routinely check this.  I have asked her to check her blood pressure at her local [facility] several times over the next week and record her numbers.  She is to follow-up with primary care next week and have asked her to bring that information to primary care or contact us and we can evaluate and potentially adjust antihypertensives as necessary.  3.  Hyperlipidemia: Last LDL in April 2018 was 57.  She is due for annual blood work with primary care next week.  Refilling atorvastatin today.  4.  Tobacco abuse: Smoking what sounds like about a half a cigarette a day.  Complete cessation advised.  She is not interested in quitting.  5.  Disposition: Follow-up in 6 months or sooner if necessary.  Murray Hodgkins, NP 01/29/2018, 3:43 PM

## 2018-02-02 ENCOUNTER — Other Ambulatory Visit: Payer: Self-pay | Admitting: Cardiovascular Disease

## 2018-02-04 ENCOUNTER — Other Ambulatory Visit: Payer: Self-pay | Admitting: Physician Assistant

## 2018-02-04 DIAGNOSIS — I10 Essential (primary) hypertension: Secondary | ICD-10-CM | POA: Diagnosis not present

## 2018-02-04 DIAGNOSIS — Z780 Asymptomatic menopausal state: Secondary | ICD-10-CM | POA: Diagnosis not present

## 2018-02-04 DIAGNOSIS — Z1239 Encounter for other screening for malignant neoplasm of breast: Secondary | ICD-10-CM | POA: Diagnosis not present

## 2018-02-04 DIAGNOSIS — C8510 Unspecified B-cell lymphoma, unspecified site: Secondary | ICD-10-CM | POA: Diagnosis not present

## 2018-02-04 DIAGNOSIS — R7309 Other abnormal glucose: Secondary | ICD-10-CM | POA: Diagnosis not present

## 2018-02-04 DIAGNOSIS — E782 Mixed hyperlipidemia: Secondary | ICD-10-CM | POA: Diagnosis not present

## 2018-02-04 DIAGNOSIS — Z1159 Encounter for screening for other viral diseases: Secondary | ICD-10-CM | POA: Diagnosis not present

## 2018-02-04 DIAGNOSIS — D0471 Carcinoma in situ of skin of right lower limb, including hip: Secondary | ICD-10-CM | POA: Diagnosis not present

## 2018-02-04 DIAGNOSIS — I2583 Coronary atherosclerosis due to lipid rich plaque: Secondary | ICD-10-CM | POA: Diagnosis not present

## 2018-02-04 DIAGNOSIS — Z0000 Encounter for general adult medical examination without abnormal findings: Secondary | ICD-10-CM | POA: Diagnosis not present

## 2018-02-04 DIAGNOSIS — I2510 Atherosclerotic heart disease of native coronary artery without angina pectoris: Secondary | ICD-10-CM | POA: Diagnosis not present

## 2018-02-04 DIAGNOSIS — Z1231 Encounter for screening mammogram for malignant neoplasm of breast: Secondary | ICD-10-CM

## 2018-02-18 DIAGNOSIS — H2513 Age-related nuclear cataract, bilateral: Secondary | ICD-10-CM | POA: Diagnosis not present

## 2018-03-18 DIAGNOSIS — I10 Essential (primary) hypertension: Secondary | ICD-10-CM | POA: Diagnosis not present

## 2018-04-02 ENCOUNTER — Ambulatory Visit
Admission: RE | Admit: 2018-04-02 | Discharge: 2018-04-02 | Disposition: A | Discharge status: Home / Self Care | Payer: PPO | Source: Ambulatory Visit | Attending: Physician Assistant | Admitting: Physician Assistant

## 2018-04-02 ENCOUNTER — Other Ambulatory Visit: Payer: Self-pay | Admitting: Physician Assistant

## 2018-04-02 DIAGNOSIS — Z1231 Encounter for screening mammogram for malignant neoplasm of breast: Secondary | ICD-10-CM

## 2018-04-02 HISTORY — DX: Malignant neoplasm of brain, unspecified: C71.9

## 2018-04-02 HISTORY — DX: Personal history of irradiation: Z92.3

## 2018-04-03 DIAGNOSIS — M5136 Other intervertebral disc degeneration, lumbar region: Secondary | ICD-10-CM | POA: Diagnosis not present

## 2018-04-03 DIAGNOSIS — M5416 Radiculopathy, lumbar region: Secondary | ICD-10-CM | POA: Diagnosis not present

## 2018-04-08 DIAGNOSIS — D485 Neoplasm of uncertain behavior of skin: Secondary | ICD-10-CM | POA: Diagnosis not present

## 2018-04-08 DIAGNOSIS — Z85828 Personal history of other malignant neoplasm of skin: Secondary | ICD-10-CM | POA: Diagnosis not present

## 2018-04-08 DIAGNOSIS — Z08 Encounter for follow-up examination after completed treatment for malignant neoplasm: Secondary | ICD-10-CM | POA: Diagnosis not present

## 2018-04-08 DIAGNOSIS — L570 Actinic keratosis: Secondary | ICD-10-CM | POA: Diagnosis not present

## 2018-04-08 DIAGNOSIS — X32XXXA Exposure to sunlight, initial encounter: Secondary | ICD-10-CM | POA: Diagnosis not present

## 2018-04-18 ENCOUNTER — Telehealth: Payer: Self-pay

## 2018-04-18 NOTE — Telephone Encounter (Signed)
Call pt regarding lung screening. Left message. 

## 2018-04-28 ENCOUNTER — Telehealth: Payer: Self-pay | Admitting: *Deleted

## 2018-04-28 NOTE — Telephone Encounter (Signed)
Attempted to contact patient r/t LDCT Screening follow up due at this time. Attempted to contact patient r/t LDCT Screening follow up due at this time.  No answer received, unable to leave message at this time, will attempt contact at later date.   

## 2018-04-29 ENCOUNTER — Telehealth: Payer: Self-pay | Admitting: *Deleted

## 2018-04-29 ENCOUNTER — Encounter: Payer: Self-pay | Admitting: *Deleted

## 2018-04-29 NOTE — Telephone Encounter (Signed)
Attempted to contact patient r/t LDCT Screening follow up due at this time. Attempted to contact patient r/t LDCT Screening follow up due at this time.  No answer received, phone disconnected, letter sent

## 2018-05-11 DIAGNOSIS — H0011 Chalazion right upper eyelid: Secondary | ICD-10-CM | POA: Diagnosis not present

## 2018-05-15 DIAGNOSIS — M5416 Radiculopathy, lumbar region: Secondary | ICD-10-CM | POA: Diagnosis not present

## 2018-05-15 DIAGNOSIS — M5136 Other intervertebral disc degeneration, lumbar region: Secondary | ICD-10-CM | POA: Diagnosis not present

## 2018-05-15 DIAGNOSIS — M7062 Trochanteric bursitis, left hip: Secondary | ICD-10-CM | POA: Diagnosis not present

## 2018-05-15 DIAGNOSIS — M7061 Trochanteric bursitis, right hip: Secondary | ICD-10-CM | POA: Diagnosis not present

## 2018-05-18 DIAGNOSIS — H2511 Age-related nuclear cataract, right eye: Secondary | ICD-10-CM | POA: Diagnosis not present

## 2018-05-20 ENCOUNTER — Encounter: Payer: Self-pay | Admitting: *Deleted

## 2018-05-27 ENCOUNTER — Encounter: Payer: Self-pay | Admitting: Anesthesiology

## 2018-05-28 ENCOUNTER — Other Ambulatory Visit: Payer: Self-pay

## 2018-05-28 ENCOUNTER — Encounter: Payer: Self-pay | Admitting: Emergency Medicine

## 2018-05-28 ENCOUNTER — Ambulatory Visit: Payer: PPO | Admitting: Anesthesiology

## 2018-05-28 ENCOUNTER — Encounter
Admission: RE | Disposition: A | Discharge status: Home / Self Care | Payer: Self-pay | Source: Home / Self Care | Attending: Ophthalmology

## 2018-05-28 ENCOUNTER — Ambulatory Visit
Admission: RE | Admit: 2018-05-28 | Discharge: 2018-05-28 | Disposition: A | Discharge status: Home / Self Care | Payer: PPO | Attending: Ophthalmology | Admitting: Ophthalmology

## 2018-05-28 DIAGNOSIS — Z87891 Personal history of nicotine dependence: Secondary | ICD-10-CM | POA: Insufficient documentation

## 2018-05-28 DIAGNOSIS — E785 Hyperlipidemia, unspecified: Secondary | ICD-10-CM | POA: Diagnosis not present

## 2018-05-28 DIAGNOSIS — Z9049 Acquired absence of other specified parts of digestive tract: Secondary | ICD-10-CM | POA: Diagnosis not present

## 2018-05-28 DIAGNOSIS — G709 Myoneural disorder, unspecified: Secondary | ICD-10-CM | POA: Insufficient documentation

## 2018-05-28 DIAGNOSIS — Z85841 Personal history of malignant neoplasm of brain: Secondary | ICD-10-CM | POA: Insufficient documentation

## 2018-05-28 DIAGNOSIS — Z90710 Acquired absence of both cervix and uterus: Secondary | ICD-10-CM | POA: Insufficient documentation

## 2018-05-28 DIAGNOSIS — Z885 Allergy status to narcotic agent status: Secondary | ICD-10-CM | POA: Diagnosis not present

## 2018-05-28 DIAGNOSIS — I25119 Atherosclerotic heart disease of native coronary artery with unspecified angina pectoris: Secondary | ICD-10-CM | POA: Diagnosis not present

## 2018-05-28 DIAGNOSIS — I252 Old myocardial infarction: Secondary | ICD-10-CM | POA: Insufficient documentation

## 2018-05-28 DIAGNOSIS — F419 Anxiety disorder, unspecified: Secondary | ICD-10-CM | POA: Diagnosis not present

## 2018-05-28 DIAGNOSIS — E7800 Pure hypercholesterolemia, unspecified: Secondary | ICD-10-CM | POA: Diagnosis not present

## 2018-05-28 DIAGNOSIS — H2511 Age-related nuclear cataract, right eye: Secondary | ICD-10-CM | POA: Diagnosis not present

## 2018-05-28 DIAGNOSIS — Z85828 Personal history of other malignant neoplasm of skin: Secondary | ICD-10-CM | POA: Diagnosis not present

## 2018-05-28 DIAGNOSIS — Z923 Personal history of irradiation: Secondary | ICD-10-CM | POA: Diagnosis not present

## 2018-05-28 DIAGNOSIS — R42 Dizziness and giddiness: Secondary | ICD-10-CM | POA: Insufficient documentation

## 2018-05-28 DIAGNOSIS — I10 Essential (primary) hypertension: Secondary | ICD-10-CM | POA: Diagnosis not present

## 2018-05-28 DIAGNOSIS — D649 Anemia, unspecified: Secondary | ICD-10-CM | POA: Diagnosis not present

## 2018-05-28 HISTORY — DX: Anxiety disorder, unspecified: F41.9

## 2018-05-28 HISTORY — DX: Acute myocardial infarction, unspecified: I21.9

## 2018-05-28 HISTORY — PX: CATARACT EXTRACTION W/PHACO: SHX586

## 2018-05-28 HISTORY — DX: Dizziness and giddiness: R42

## 2018-05-28 SURGERY — PHACOEMULSIFICATION, CATARACT, WITH IOL INSERTION
Anesthesia: Monitor Anesthesia Care | Site: Eye | Laterality: Right

## 2018-05-28 MED ORDER — TRYPAN BLUE 0.06 % OP SOLN
OPHTHALMIC | Status: DC | PRN
  Administered 2018-05-28: 0.5 mL via INTRAOCULAR

## 2018-05-28 MED ORDER — SODIUM CHLORIDE 0.9 % IV SOLN
INTRAVENOUS | Status: DC
  Administered 2018-05-28: 09:00:00 via INTRAVENOUS

## 2018-05-28 MED ORDER — SODIUM HYALURONATE 23 MG/ML IO SOLN
INTRAOCULAR | Status: DC | PRN
  Administered 2018-05-28: 0.6 mL via INTRAOCULAR

## 2018-05-28 MED ORDER — ARMC OPHTHALMIC DILATING DROPS
1.0000 "application " | OPHTHALMIC | Status: AC
  Administered 2018-05-28 (×3): 1 via OPHTHALMIC

## 2018-05-28 MED ORDER — EPINEPHRINE PF 1 MG/ML IJ SOLN
INTRAOCULAR | Status: DC | PRN
  Administered 2018-05-28: 11:00:00 via OPHTHALMIC

## 2018-05-28 MED ORDER — TETRACAINE HCL 0.5 % OP SOLN
OPHTHALMIC | Status: AC
  Administered 2018-05-28: 1 [drp] via OPHTHALMIC
  Filled 2018-05-28: qty 4

## 2018-05-28 MED ORDER — MOXIFLOXACIN HCL 0.5 % OP SOLN: 1.0000 [drp] | OPHTHALMIC | Status: DC | PRN

## 2018-05-28 MED ORDER — ARMC OPHTHALMIC DILATING DROPS
OPHTHALMIC | Status: AC
  Administered 2018-05-28: 1 via OPHTHALMIC
  Filled 2018-05-28: qty 0.5

## 2018-05-28 MED ORDER — CARBACHOL 0.01 % IO SOLN
INTRAOCULAR | Status: DC | PRN
  Administered 2018-05-28: 0.5 mL via INTRAOCULAR

## 2018-05-28 MED ORDER — POVIDONE-IODINE 5 % OP SOLN
OPHTHALMIC | Status: AC
  Filled 2018-05-28: qty 60

## 2018-05-28 MED ORDER — TRYPAN BLUE 0.06 % OP SOLN
OPHTHALMIC | Status: AC
  Filled 2018-05-28: qty 0.5

## 2018-05-28 MED ORDER — SODIUM HYALURONATE 23 MG/ML IO SOLN
INTRAOCULAR | Status: AC
  Filled 2018-05-28: qty 0.6

## 2018-05-28 MED ORDER — MOXIFLOXACIN HCL 0.5 % OP SOLN
OPHTHALMIC | Status: DC | PRN
  Administered 2018-05-28: 0.2 mL via OPHTHALMIC

## 2018-05-28 MED ORDER — NA HYALUR & NA CHOND-NA HYALUR 0.55-0.5 ML IO KIT
PACK | INTRAOCULAR | Status: AC
  Filled 2018-05-28: qty 1.05

## 2018-05-28 MED ORDER — MIDAZOLAM HCL 2 MG/2ML IJ SOLN
INTRAMUSCULAR | Status: DC | PRN
  Administered 2018-05-28: 2 mg via INTRAVENOUS

## 2018-05-28 MED ORDER — MIDAZOLAM HCL 2 MG/2ML IJ SOLN
INTRAMUSCULAR | Status: AC
  Filled 2018-05-28: qty 2

## 2018-05-28 MED ORDER — NA HYALUR & NA CHOND-NA HYALUR 0.55-0.5 ML IO KIT
PACK | INTRAOCULAR | Status: DC | PRN
  Administered 2018-05-28: 1 via OPHTHALMIC

## 2018-05-28 MED ORDER — EPINEPHRINE PF 1 MG/ML IJ SOLN
INTRAMUSCULAR | Status: AC
  Filled 2018-05-28: qty 2

## 2018-05-28 MED ORDER — FENTANYL CITRATE (PF) 100 MCG/2ML IJ SOLN
INTRAMUSCULAR | Status: AC
  Filled 2018-05-28: qty 2

## 2018-05-28 MED ORDER — LIDOCAINE HCL (PF) 4 % IJ SOLN
INTRAOCULAR | Status: DC | PRN
  Administered 2018-05-28: 4 mL via OPHTHALMIC

## 2018-05-28 MED ORDER — FENTANYL CITRATE (PF) 100 MCG/2ML IJ SOLN
INTRAMUSCULAR | Status: DC | PRN
  Administered 2018-05-28 (×4): 25 ug via INTRAVENOUS

## 2018-05-28 MED ORDER — POVIDONE-IODINE 5 % OP SOLN
OPHTHALMIC | Status: DC | PRN
  Administered 2018-05-28: 1 via OPHTHALMIC

## 2018-05-28 MED ORDER — LIDOCAINE HCL (PF) 4 % IJ SOLN
INTRAMUSCULAR | Status: AC
  Filled 2018-05-28: qty 5

## 2018-05-28 MED ORDER — MOXIFLOXACIN HCL 0.5 % OP SOLN
OPHTHALMIC | Status: AC
  Filled 2018-05-28: qty 3

## 2018-05-28 MED ORDER — TETRACAINE HCL 0.5 % OP SOLN
1.0000 [drp] | OPHTHALMIC | Status: AC | PRN
  Administered 2018-05-28 (×3): 1 [drp] via OPHTHALMIC

## 2018-05-28 SURGICAL SUPPLY — 17 items
DISSECTOR HYDRO NUCLEUS 50X22 (MISCELLANEOUS) ×8 IMPLANT
GLOVE BIOGEL M 6.5 STRL (GLOVE) ×2 IMPLANT
GOWN STRL REUS W/ TWL LRG LVL3 (GOWN DISPOSABLE) ×1 IMPLANT
GOWN STRL REUS W/ TWL XL LVL3 (GOWN DISPOSABLE) ×1 IMPLANT
GOWN STRL REUS W/TWL LRG LVL3 (GOWN DISPOSABLE) ×1
GOWN STRL REUS W/TWL XL LVL3 (GOWN DISPOSABLE) ×1
KNIFE 45D UP 2.3 (MISCELLANEOUS) ×2 IMPLANT
LABEL CATARACT MEDS ST (LABEL) ×2 IMPLANT
LENS IOL ACRYSOF IQ 20.0 (Intraocular Lens) ×1 IMPLANT
PACK CATARACT (MISCELLANEOUS) ×2 IMPLANT
PACK CATARACT KING (MISCELLANEOUS) ×2 IMPLANT
PACK EYE AFTER SURG (MISCELLANEOUS) ×2 IMPLANT
SOL BSS BAG (MISCELLANEOUS) ×2
SOLUTION BSS BAG (MISCELLANEOUS) ×1 IMPLANT
SYR 5ML LL (SYRINGE) ×2 IMPLANT
WATER STERILE IRR 250ML POUR (IV SOLUTION) ×2 IMPLANT
WIPE NON LINTING 3.25X3.25 (MISCELLANEOUS) ×2 IMPLANT

## 2018-05-28 NOTE — Anesthesia Post-op Follow-up Note (Signed)
Anesthesia QCDR form completed.        

## 2018-05-28 NOTE — Transfer of Care (Signed)
Immediate Anesthesia Transfer of Care Note  Patient: Susan Mcmillan  Procedure(s) Performed: CATARACT EXTRACTION PHACO AND INTRAOCULAR LENS PLACEMENT (IOC) RIGHT (Right Eye)  Patient Location: PACU  Anesthesia Type:MAC  Level of Consciousness: awake, alert  and oriented  Airway & Oxygen Therapy: Patient Spontanous Breathing  Post-op Assessment: Report given to RN  Post vital signs: Reviewed and stable  Last Vitals:  Vitals Value Taken Time  BP 151/63 05/28/2018 11:14 AM  Temp 35.9 C 05/28/2018 11:14 AM  Pulse 62 05/28/2018 11:14 AM  Resp 16 05/28/2018 11:14 AM  SpO2 94 % 05/28/2018 11:14 AM    Last Pain:  Vitals:   05/28/18 1114  TempSrc: Temporal  PainSc: 0-No pain         Complications: No apparent anesthesia complications

## 2018-05-28 NOTE — Op Note (Signed)
  PREOPERATIVE DIAGNOSIS:  Nuclear sclerotic cataract of the RIGHT eye.   POSTOPERATIVE DIAGNOSIS:  Nuclear sclerotic cataract of the RIGHT eye.   OPERATIVE PROCEDURE: Cataract surgery OD   SURGEON:  Marchia Meiers, MD.   ANESTHESIA:  Anesthesiologist: Gunnar Bulla, MD CRNA: Rudean Hitt, CRNA  1.      Managed anesthesia care. 2.     0.74ml of Shugarcaine was instilled following the paracentesis   COMPLICATIONS:  None.   TECHNIQUE:   Divide and conquer   DESCRIPTION OF PROCEDURE:  The patient was examined and consented in the preoperative holding area where the aforementioned topical anesthesia was applied to the RIGHT eye and then brought back to the Operating Room where the left eye was prepped and draped in the usual sterile ophthalmic fashion and a lid speculum was placed. A paracentesis was created with the side port blade, the anterior chamber was washed out with trypan blue to stain the anterior capsule, and the anterior chamber was filled with viscoelastic. A near clear corneal incision was performed with the steel keratome. A continuous curvilinear capsulorrhexis was performed with a cystotome followed by the capsulorrhexis forceps. Hydrodissection and hydrodelineation were carried out with BSS on a blunt cannula. The lens was removed in a divide and conquer  technique and the remaining cortical material was removed with the irrigation-aspiration handpiece. The capsular bag was inflated with viscoelastic and the lens was placed in the capsular bag without complication. The remaining viscoelastic was removed from the eye with the irrigation-aspiration handpiece. The wounds were hydrated. The anterior chamber was flushed and the eye was inflated to physiologic pressure. 0.18ml Vigamox was placed in the anterior chamber. The wounds were found to be water tight. The eye was dressed with Vigamox. The patient was given protective glasses to wear throughout the day and a shield with which to  sleep tonight. The patient was also given drops with which to begin a drop regimen today and will follow-up with me in one day. Implant Name Type Inv. Item Serial No. Manufacturer Lot No. LRB No. Used  LENS IOL ACRYSOF IQ 20.0 - [id number] Intraocular Lens LENS IOL ACRYSOF IQ 20.0 [id number] ALCON  Right 1    Procedure(s) with comments: CATARACT EXTRACTION PHACO AND INTRAOCULAR LENS PLACEMENT (IOC) RIGHT (Right) - [place] 00:56 CDE 7.17 Fluid pack Lot # [id number]  Electronically signed: Marchia Meiers 05/28/2018 11:15 AM

## 2018-05-28 NOTE — Discharge Instructions (Signed)
Eye Surgery Discharge Instructions    Expect mild scratchy sensation or mild soreness. DO NOT RUB YOUR EYE!  The day of surgery:  Minimal physical activity, but bed rest is not required  No reading, computer work, or close hand work  No bending, lifting, or straining.  May watch TV  For 24 hours:  No driving, legal decisions, or alcoholic beverages  Safety precautions  Eat anything you prefer: It is better to start with liquids, then soup then solid foods.  _____ Eye patch should be worn until postoperative exam tomorrow.  ____ Solar shield eyeglasses should be worn for comfort in the sunlight/patch while sleeping  Resume all regular medications including aspirin or Coumadin if these were discontinued prior to surgery. You may shower, bathe, shave, or wash your hair. Tylenol may be taken for mild discomfort.  Call your doctor if you experience significant pain, nausea, or vomiting, fever > 101 or other signs of infection. [phone] or [phone] Specific instructions:  Follow-up Information    Marchia Meiers, MD Follow up on 05/29/2018.   Specialty:  Ophthalmology Why:  @ 9:30 am Contact information: [address] [place] [address] [phone]

## 2018-05-28 NOTE — Anesthesia Postprocedure Evaluation (Signed)
Anesthesia Post Note  Patient: Susan Mcmillan  Procedure(s) Performed: CATARACT EXTRACTION PHACO AND INTRAOCULAR LENS PLACEMENT (IOC) RIGHT (Right Eye)  Patient location during evaluation: PACU Anesthesia Type: MAC Level of consciousness: awake and alert and oriented Pain management: pain level controlled Vital Signs Assessment: post-procedure vital signs reviewed and stable Respiratory status: spontaneous breathing Cardiovascular status: blood pressure returned to baseline and stable Postop Assessment: adequate PO intake Anesthetic complications: no     Last Vitals:  Vitals:   05/28/18 0901 05/28/18 1114  BP: 139/76 (!) 151/63  Pulse: (!) 52 62  Resp: 17 16  Temp: (!) 36.1 C (!) 35.9 C  SpO2: 98% 94%    Last Pain:  Vitals:   05/28/18 1114  TempSrc: Temporal  PainSc: 0-No pain                 Izear Pine Rayetta Humphrey

## 2018-05-28 NOTE — H&P (Signed)
   I have reviewed the patient's H&P and agree with its findings. There have been no interval changes.  Micholas Drumwright MD Ophthalmology 

## 2018-05-28 NOTE — Anesthesia Preprocedure Evaluation (Signed)
Anesthesia Evaluation  Patient identified by MRN, date of birth, ID band Patient awake    Reviewed: Allergy & Precautions, NPO status , Patient's Chart, lab work & pertinent test results, reviewed documented beta blocker date and time   Airway Mallampati: II  TM Distance: >3 FB     Dental  (+) Chipped   Pulmonary former smoker,           Cardiovascular hypertension, Pt. on medications and Pt. on home beta blockers + angina + CAD and + Past MI       Neuro/Psych PSYCHIATRIC DISORDERS Anxiety  Neuromuscular disease    GI/Hepatic   Endo/Other    Renal/GU      Musculoskeletal   Abdominal   Peds  Hematology  (+) anemia ,   Anesthesia Other Findings Smokes. No cardiac stents.  Reproductive/Obstetrics                             Anesthesia Physical Anesthesia Plan  ASA: III  Anesthesia Plan: MAC   Post-op Pain Management:    Induction:   PONV Risk Score and Plan:   Airway Management Planned:   Additional Equipment:   Intra-op Plan:   Post-operative Plan:   Informed Consent: I have reviewed the patients History and Physical, chart, labs and discussed the procedure including the risks, benefits and alternatives for the proposed anesthesia with the patient or authorized representative who has indicated his/her understanding and acceptance.       Plan Discussed with: CRNA  Anesthesia Plan Comments:         Anesthesia Quick Evaluation

## 2018-05-29 ENCOUNTER — Encounter: Payer: Self-pay | Admitting: Ophthalmology

## 2018-06-03 DIAGNOSIS — M5136 Other intervertebral disc degeneration, lumbar region: Secondary | ICD-10-CM | POA: Diagnosis not present

## 2018-06-03 DIAGNOSIS — M5416 Radiculopathy, lumbar region: Secondary | ICD-10-CM | POA: Diagnosis not present

## 2018-07-23 DIAGNOSIS — L570 Actinic keratosis: Secondary | ICD-10-CM | POA: Diagnosis not present

## 2018-07-23 DIAGNOSIS — Z08 Encounter for follow-up examination after completed treatment for malignant neoplasm: Secondary | ICD-10-CM | POA: Diagnosis not present

## 2018-07-23 DIAGNOSIS — D485 Neoplasm of uncertain behavior of skin: Secondary | ICD-10-CM | POA: Diagnosis not present

## 2018-07-23 DIAGNOSIS — C44729 Squamous cell carcinoma of skin of left lower limb, including hip: Secondary | ICD-10-CM | POA: Diagnosis not present

## 2018-07-23 DIAGNOSIS — Z85828 Personal history of other malignant neoplasm of skin: Secondary | ICD-10-CM | POA: Diagnosis not present

## 2018-07-23 DIAGNOSIS — X32XXXA Exposure to sunlight, initial encounter: Secondary | ICD-10-CM | POA: Diagnosis not present

## 2018-07-30 ENCOUNTER — Telehealth: Payer: Self-pay | Admitting: *Deleted

## 2018-07-30 DIAGNOSIS — H26491 Other secondary cataract, right eye: Secondary | ICD-10-CM | POA: Diagnosis not present

## 2018-07-30 NOTE — Telephone Encounter (Signed)
   Primary Cardiologist:  Kathlyn Sacramento, MD   Patient contacted.  History reviewed.  No symptoms to suggest any unstable cardiac conditions.  Based on discussion, with current pandemic situation, we will be postponing this appointment for the patient per her request.  If symptoms change, she has been instructed to contact our office.  Routing to C19 CANCEL pool for tracking (P CV DIV CV19 CANCEL).     Cardiac Questionnaire:    Since your last visit or hospitalization:    1. Have you been having new or worsening chest pain? No   2. Have you been having new or worsening shortness of breath? No 3. Have you been having new or worsening leg swelling, wt gain, or increase in abdominal girth (pants fitting more tightly)? No   4. Have you had any passing out spells? No                  .

## 2018-07-30 NOTE — Telephone Encounter (Signed)
Left a message for the patient to call back as part of the COVID 19 prescreening.

## 2018-07-31 ENCOUNTER — Ambulatory Visit: Payer: PPO | Admitting: Cardiovascular Disease

## 2018-08-05 DIAGNOSIS — M5416 Radiculopathy, lumbar region: Secondary | ICD-10-CM | POA: Diagnosis not present

## 2018-08-05 DIAGNOSIS — I10 Essential (primary) hypertension: Secondary | ICD-10-CM | POA: Diagnosis not present

## 2018-08-05 DIAGNOSIS — I2583 Coronary atherosclerosis due to lipid rich plaque: Secondary | ICD-10-CM | POA: Diagnosis not present

## 2018-08-05 DIAGNOSIS — E782 Mixed hyperlipidemia: Secondary | ICD-10-CM | POA: Diagnosis not present

## 2018-08-05 DIAGNOSIS — D329 Benign neoplasm of meninges, unspecified: Secondary | ICD-10-CM | POA: Diagnosis not present

## 2018-08-05 DIAGNOSIS — C8510 Unspecified B-cell lymphoma, unspecified site: Secondary | ICD-10-CM | POA: Diagnosis not present

## 2018-08-05 DIAGNOSIS — I2510 Atherosclerotic heart disease of native coronary artery without angina pectoris: Secondary | ICD-10-CM | POA: Diagnosis not present

## 2018-08-05 DIAGNOSIS — D0471 Carcinoma in situ of skin of right lower limb, including hip: Secondary | ICD-10-CM | POA: Diagnosis not present

## 2018-08-05 DIAGNOSIS — Z0000 Encounter for general adult medical examination without abnormal findings: Secondary | ICD-10-CM | POA: Diagnosis not present

## 2018-08-05 DIAGNOSIS — R739 Hyperglycemia, unspecified: Secondary | ICD-10-CM | POA: Diagnosis not present

## 2018-08-05 DIAGNOSIS — M5136 Other intervertebral disc degeneration, lumbar region: Secondary | ICD-10-CM | POA: Diagnosis not present

## 2018-08-07 DIAGNOSIS — C44729 Squamous cell carcinoma of skin of left lower limb, including hip: Secondary | ICD-10-CM | POA: Diagnosis not present

## 2018-08-10 NOTE — Telephone Encounter (Signed)
Virtual Visit Pre-Appointment Phone Call  Steps For Call:  1. Confirm consent - "In the setting of the current Covid19 crisis, you are scheduled for a (phone or video) visit with your provider on (date) at (time).  Just as we do with many in-office visits, in order for you to participate in this visit, we must obtain consent.  If you'd like, I can send this to your mychart (if signed up) or email for you to review.  Otherwise, I can obtain your verbal consent now.  All virtual visits are billed to your insurance company just like a normal visit would be.  By agreeing to a virtual visit, we'd like you to understand that the technology does not allow for your provider to perform an examination, and thus may limit your provider's ability to fully assess your condition.  Finally, though the technology is pretty good, we cannot assure that it will always work on either your or our end, and in the setting of a video visit, we may have to convert it to a phone-only visit.  In either situation, we cannot ensure that we have a secure connection.  Are you willing to proceed?"  2. Give patient instructions for WebEx download to smartphone as below if video visit  3. Advise patient to be prepared with any vital sign or heart rhythm information, their current medicines, and a piece of paper and pen handy for any instructions they may receive the day of their visit  4. Inform patient they will receive a phone call 15 minutes prior to their appointment time (may be from unknown caller ID) so they should be prepared to answer  5. Confirm that appointment type is correct in Epic appointment notes (video vs telephone)    TELEPHONE CALL NOTE  Susan Mcmillan has been deemed a candidate for a follow-up tele-health visit to limit community exposure during the Covid-19 pandemic. I spoke with the patient via phone to ensure availability of phone/video source, confirm preferred email & phone number, and discuss  instructions and expectations.  I reminded Susan Mcmillan to be prepared with any vital sign and/or heart rhythm information that could potentially be obtained via home monitoring, at the time of her visit. I reminded Susan Mcmillan to expect a phone call at the time of her visit if her visit.  Did the patient verbally acknowledge consent to treatment? yes  Clarisse Gouge 08/10/2018 3:18 PM   DOWNLOADING THE [place], go to [employer] and type in WebEx in the search bar. [place] [employer], the blue/green circle. The app is free but as with any other app downloads, their phone may require them to verify saved payment information or Apple password. The patient does NOT have to create an account.  - If Android, ask patient to go to [employer] and type in WebEx in the search bar. [place] [employer], the blue/green circle. The app is free but as with any other app downloads, their phone may require them to verify saved payment information or Android password. The patient does NOT have to create an account.   CONSENT FOR TELE-HEALTH VISIT - PLEASE REVIEW  I hereby voluntarily request, consent and authorize [facility] and its employed or contracted physicians, physician assistants, nurse practitioners or other licensed health care professionals (the Practitioner), to provide me with telemedicine health care services (the Services") as deemed necessary by the treating Practitioner. I  acknowledge and consent to receive the Services by the Practitioner via telemedicine. I understand that the telemedicine visit will involve communicating with the Practitioner through live audiovisual communication technology and the disclosure of certain medical information by electronic transmission. I acknowledge that I have been given the opportunity to request an in-person assessment or other available alternative prior to the telemedicine visit and  am voluntarily participating in the telemedicine visit.  I understand that I have the right to withhold or withdraw my consent to the use of telemedicine in the course of my care at any time, without affecting my right to future care or treatment, and that the Practitioner or I may terminate the telemedicine visit at any time. I understand that I have the right to inspect all information obtained and/or recorded in the course of the telemedicine visit and may receive copies of available information for a reasonable fee.  I understand that some of the potential risks of receiving the Services via telemedicine include:   Delay or interruption in medical evaluation due to technological equipment failure or disruption;  Information transmitted may not be sufficient (e.g. poor resolution of images) to allow for appropriate medical decision making by the Practitioner; and/or   In rare instances, security protocols could fail, causing a breach of personal health information.  Furthermore, I acknowledge that it is my responsibility to provide information about my medical history, conditions and care that is complete and accurate to the best of my ability. I acknowledge that Practitioner's advice, recommendations, and/or decision may be based on factors not within their control, such as incomplete or inaccurate data provided by me or distortions of diagnostic images or specimens that may result from electronic transmissions. I understand that the practice of medicine is not an exact science and that Practitioner makes no warranties or guarantees regarding treatment outcomes. I acknowledge that I will receive a copy of this consent concurrently upon execution via email to the email address I last provided but may also request a printed copy by calling the office of [facility].    I understand that my insurance will be billed for this visit.   I have read or had this consent read to me.  I understand the  contents of this consent, which adequately explains the benefits and risks of the Services being provided via telemedicine.   I have been provided ample opportunity to ask questions regarding this consent and the Services and have had my questions answered to my satisfaction.  I give my informed consent for the services to be provided through the use of telemedicine in my medical care  By participating in this telemedicine visit I agree to the above.

## 2018-08-11 ENCOUNTER — Telehealth (INDEPENDENT_AMBULATORY_CARE_PROVIDER_SITE_OTHER): Payer: PPO | Admitting: Cardiovascular Disease

## 2018-08-11 ENCOUNTER — Other Ambulatory Visit: Payer: Self-pay

## 2018-08-11 DIAGNOSIS — F17210 Nicotine dependence, cigarettes, uncomplicated: Secondary | ICD-10-CM | POA: Diagnosis not present

## 2018-08-11 DIAGNOSIS — I119 Hypertensive heart disease without heart failure: Secondary | ICD-10-CM

## 2018-08-11 DIAGNOSIS — E785 Hyperlipidemia, unspecified: Secondary | ICD-10-CM | POA: Diagnosis not present

## 2018-08-11 DIAGNOSIS — I2510 Atherosclerotic heart disease of native coronary artery without angina pectoris: Secondary | ICD-10-CM

## 2018-08-11 NOTE — Patient Instructions (Signed)
Medication Instructions:  No change If you need a refill on your cardiac medications before your next appointment, please call your pharmacy.   Lab work: None If you have labs (blood work) drawn today and your tests are completely normal, you will receive your results only by: Marland Kitchen MyChart Message (if you have MyChart) OR . A paper copy in the mail If you have any lab test that is abnormal or we need to change your treatment, we will call you to review the results.  Testing/Procedures: None  Follow-Up: At [facility], you and your health needs are our priority.  As part of our continuing mission to provide you with exceptional heart care, we have created designated Provider Care Teams.  These Care Teams include your primary Cardiologist (physician) and Advanced Practice Providers (APPs -  Physician Assistants and Nurse Practitioners) who all work together to provide you with the care you need, when you need it. You will need a follow up appointment in 6 months.  Please call our office 2 months in advance to schedule this appointment.  You may see Kathlyn Sacramento, MD or one of the following Advanced Practice Providers on your designated Care Team:   Murray Hodgkins, NP Christell Faith, PA-C . Marrianne Mood, PA-C  Any Other Special Instructions Will Be Listed Below (If Applicable).

## 2018-08-11 NOTE — Progress Notes (Signed)
Virtual Visit via Telephone Note    Evaluation Performed:  Follow-up visit  This visit type was conducted due to national recommendations for restrictions regarding the COVID-19 Pandemic (e.g. social distancing).  This format is felt to be most appropriate for this patient at this time.  All issues noted in this document were discussed and addressed.  No physical exam was performed (except for noted visual exam findings with Video Visits).  Please refer to the patient's chart (MyChart message for video visits and phone note for telephone visits) for the patient's consent to telehealth for [facility].  Date:  08/11/2018   ID:  Susan Mcmillan, DOB 04-20-1946, MRN [id number]  Patient Location:    Provider location:   [facility] heart care  PCP:  Marinda Elk, MD  Cardiologist:  Kathlyn Sacramento, MD  Electrophysiologist:  None   Chief Complaint: Follow-up visit  History of Present Illness:    Susan Mcmillan is a 73 y.o. female who presents via audio/video conferencing for a telehealth visit today.    She has known history of CAD, hypertension, hyperlipidemia, ischemic cardiomyopathy with an EF of 50 to 55%, tobacco abuse, chronic back and neck pain, and meningioma status post resection.  In March 2017, she underwent CT of the chest to follow-up pulmonary nodule and this instantly showed multivessel coronary at the sclerosis.    She was also noted to have anterior T wave inversion on ECG and underwent stress testing which showed moderate mid to distal anterior ischemia with normal LV function and apical hypokinesis.  Diagnostic catheterization revealed a totally occluded mid LAD with right to left collaterals and left to left collaterals.  She otherwise had moderate RCA and branch vessel disease.  She has been medically managed ever since. She has been doing well overall from a cardiac standpoint with no chest pain or shortness of breath.  She takes her medications regularly. 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  She had resection followed by radiation  The patient does not have symptoms concerning for COVID-19 infection (fever, chills, cough, or new shortness of breath).    Prior CV studies:   The following studies were reviewed today:    Past Medical History:  Diagnosis Date  . Anxiety   . CAD (coronary artery disease)    a. 11/2015 MV: mod mid-distal ant ischemia, EF 58% w/ apical HK;  b. 11/2015 Cath: LM nl, LAD 80/17m (fills vial collats from RPDA & D1), D1 60ost, LCX small, nl, RCA 50p/m, RPDA nl, RPAV min irregs, RPl1/2/3 min irregs, EF 50-55%.  . Cancer of brain ([place]) 03/2017  . Dysuria   . History of kidney stones   . Hyperlipemia   . Hypertension   . Hypertensive heart disease   . IBS (irritable bowel syndrome)   . Myocardial infarction ([place])    SILENT  . Pernicious anemia   . Personal history of radiation therapy   . Personal history of tobacco use, presenting hazards to health 07/10/2015  . 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 Skin cancer   . Tobacco abuse    a. Still smoking ~ 1 cigarette/day.  Marland Kitchen Urethral caruncle   . Vertigo   . Vulvar adhesions 10/06/2013   perianal skin bridge noted at posterior fourchette and region of WLE surgical site  . Vulvar leukoplakia   . Vulvar pain    Past Surgical History:  Procedure Laterality Date  . ABDOMINAL  HERNIA REPAIR  2014   Dr. Rochel Brome  . ABDOMINAL HYSTERECTOMY  1978  . APPENDECTOMY    . BRAIN SURGERY    . CARDIAC CATHETERIZATION Left 12/07/2015   Procedure: Left Heart Cath and Coronary Angiography;  Surgeon: Wellington Hampshire, MD;  Location: [facility];  Service: Cardiovascular;  Laterality: Left;  . CATARACT EXTRACTION W/PHACO Right 05/28/2018   Procedure: CATARACT EXTRACTION PHACO AND INTRAOCULAR LENS PLACEMENT ([place]) RIGHT;  Surgeon: Marchia Meiers, MD;  Location: [facility];  Service: Ophthalmology;  Laterality: Right;  [place] 00:56 CDE  7.17 Fluid pack Lot # [id number]  . CERVICAL BIOPSY     Dr. Enzo Bi  . CHOLECYSTECTOMY    . wide local excision  07/2013   vin 3 w/ margins involoved     No outpatient medications have been marked as taking for the 08/11/18 encounter (Appointment) with Wellington Hampshire, MD.     Allergies:   Ace inhibitors; Codeine; Demerol [meperidine]; Erythromycin; Iodinated diagnostic agents; Other; and Penicillins   Social History   Tobacco Use  . Smoking status: Former Smoker    Packs/day: 0.50    Years: 55.00    Pack years: 27.50    Types: Cigarettes, E-cigarettes    Last attempt to quit: 12/07/2015    Years since quitting: 2.6  . Smokeless tobacco: Never Used  Substance Use Topics  . Alcohol use: Yes    Alcohol/week: 0.0 standard drinks    Comment: occasion  . Drug use: No     Family Hx: The patient's family history includes Hyperlipidemia in her sister; Hypertension in her sister; Macular degeneration in her sister. There is no history of Diabetes, Heart disease, or Cancer.  ROS:   Please see the history of present illness.     All other systems reviewed and are negative.   Labs/Other Tests and Data Reviewed:    Recent Labs: No results found for requested labs within last 8760 hours.   Recent Lipid Panel Lab Results  Component Value Date/Time   CHOL 149 02/06/2016 09:53 AM   TRIG 155 (H) 02/06/2016 09:53 AM   HDL 54 02/06/2016 09:53 AM   CHOLHDL 2.8 02/06/2016 09:53 AM   CHOLHDL 4 05/17/2015 02:42 PM   LDLCALC 64 02/06/2016 09:53 AM   LDLDIRECT 88.0 05/17/2015 02:42 PM    Wt Readings from Last 3 Encounters:  05/28/18 155 lb (70.3 kg)  01/29/18 156 lb 8 oz (71 kg)  03/25/17 157 lb 8 oz (71.4 kg)     Objective:    Vital Signs:  There were no vitals taken for this visit.     ASSESSMENT & PLAN:    1.  Coronary artery disease involving native coronary arteries without angina: She is doing well overall with no anginal symptoms.  Continue medical therapy with  aspirin, metoprolol and atorvastatin.  2.  Essential hypertension: She reports good readings at home.  3.  Hyperlipidemia: Continue high-dose atorvastatin.  Most recent lipid profile in September 2019 showed an LDL of 72.  4.  Tobacco use: This was not discussed with her today but she was not interested in quitting during previous discussions.  COVID-19 Education: The signs and symptoms of COVID-19 were discussed with the patient and how to seek care for testing (follow up with PCP or arrange E-visit).  The importance of social distancing was discussed today.  Patient Risk:   After full review of this patient's clinical status, I feel that they are at least moderate risk at this  time.  Time:   Today, I have spent 18 minutes with the patient with telehealth technology discussing .     Medication Adjustments/Labs and Tests Ordered: Current medicines are reviewed at length with the patient today.  Concerns regarding medicines are outlined above.  Tests Ordered: No orders of the defined types were placed in this encounter.  Medication Changes: No orders of the defined types were placed in this encounter.   Disposition:  Follow up in 6 month(s)  Signed, Kathlyn Sacramento, MD  08/11/2018 4:05 PM    [facility]

## 2018-08-12 ENCOUNTER — Inpatient Hospital Stay: Payer: PPO | Attending: Oncology | Admitting: Oncology

## 2018-08-12 ENCOUNTER — Encounter: Payer: Self-pay | Admitting: Oncology

## 2018-08-12 ENCOUNTER — Inpatient Hospital Stay: Payer: PPO

## 2018-08-12 ENCOUNTER — Other Ambulatory Visit: Payer: Self-pay

## 2018-08-12 VITALS — BP 129/85 | HR 80 | Temp 97.4°F | Resp 18 | Ht 62.0 in | Wt 158.7 lb

## 2018-08-12 DIAGNOSIS — E876 Hypokalemia: Secondary | ICD-10-CM | POA: Diagnosis not present

## 2018-08-12 DIAGNOSIS — M5416 Radiculopathy, lumbar region: Secondary | ICD-10-CM | POA: Diagnosis not present

## 2018-08-12 DIAGNOSIS — R200 Anesthesia of skin: Secondary | ICD-10-CM

## 2018-08-12 DIAGNOSIS — Z8579 Personal history of other malignant neoplasms of lymphoid, hematopoietic and related tissues: Secondary | ICD-10-CM

## 2018-08-12 DIAGNOSIS — Z8572 Personal history of non-Hodgkin lymphomas: Secondary | ICD-10-CM | POA: Diagnosis not present

## 2018-08-12 DIAGNOSIS — Z923 Personal history of irradiation: Secondary | ICD-10-CM | POA: Diagnosis not present

## 2018-08-12 DIAGNOSIS — Z90710 Acquired absence of both cervix and uterus: Secondary | ICD-10-CM | POA: Diagnosis not present

## 2018-08-12 DIAGNOSIS — M1612 Unilateral primary osteoarthritis, left hip: Secondary | ICD-10-CM | POA: Diagnosis not present

## 2018-08-12 DIAGNOSIS — M5136 Other intervertebral disc degeneration, lumbar region: Secondary | ICD-10-CM | POA: Diagnosis not present

## 2018-08-12 DIAGNOSIS — K589 Irritable bowel syndrome without diarrhea: Secondary | ICD-10-CM | POA: Diagnosis not present

## 2018-08-12 DIAGNOSIS — Z7289 Other problems related to lifestyle: Secondary | ICD-10-CM

## 2018-08-12 DIAGNOSIS — Z9889 Other specified postprocedural states: Secondary | ICD-10-CM | POA: Diagnosis not present

## 2018-08-12 DIAGNOSIS — Z808 Family history of malignant neoplasm of other organs or systems: Secondary | ICD-10-CM

## 2018-08-12 DIAGNOSIS — E1122 Type 2 diabetes mellitus with diabetic chronic kidney disease: Secondary | ICD-10-CM | POA: Diagnosis not present

## 2018-08-12 DIAGNOSIS — F17210 Nicotine dependence, cigarettes, uncomplicated: Secondary | ICD-10-CM | POA: Diagnosis not present

## 2018-08-12 DIAGNOSIS — R0689 Other abnormalities of breathing: Secondary | ICD-10-CM

## 2018-08-12 DIAGNOSIS — M7061 Trochanteric bursitis, right hip: Secondary | ICD-10-CM | POA: Diagnosis not present

## 2018-08-12 DIAGNOSIS — Z79899 Other long term (current) drug therapy: Secondary | ICD-10-CM

## 2018-08-12 DIAGNOSIS — M25552 Pain in left hip: Secondary | ICD-10-CM | POA: Diagnosis not present

## 2018-08-12 DIAGNOSIS — M7062 Trochanteric bursitis, left hip: Secondary | ICD-10-CM | POA: Diagnosis not present

## 2018-08-12 DIAGNOSIS — M545 Low back pain: Secondary | ICD-10-CM | POA: Diagnosis not present

## 2018-08-12 DIAGNOSIS — I10 Essential (primary) hypertension: Secondary | ICD-10-CM | POA: Diagnosis not present

## 2018-08-12 DIAGNOSIS — F17290 Nicotine dependence, other tobacco product, uncomplicated: Secondary | ICD-10-CM | POA: Diagnosis not present

## 2018-08-12 DIAGNOSIS — Z720 Tobacco use: Secondary | ICD-10-CM

## 2018-08-12 DIAGNOSIS — E782 Mixed hyperlipidemia: Secondary | ICD-10-CM | POA: Diagnosis not present

## 2018-08-12 LAB — CBC WITH DIFFERENTIAL/PLATELET
Abs Immature Granulocytes: 0.03 10*3/uL (ref 0.00–0.07)
Basophils Absolute: 0.1 10*3/uL (ref 0.0–0.1)
Basophils Relative: 1 %
Eosinophils Absolute: 0.1 10*3/uL (ref 0.0–0.5)
Eosinophils Relative: 2 %
HCT: 42.5 % (ref 36.0–46.0)
Hemoglobin: 14.2 g/dL (ref 12.0–15.0)
Immature Granulocytes: 0 %
Lymphocytes Relative: 14 %
Lymphs Abs: 1 10*3/uL (ref 0.7–4.0)
MCH: 30.5 pg (ref 26.0–34.0)
MCHC: 33.4 g/dL (ref 30.0–36.0)
MCV: 91.2 fL (ref 80.0–100.0)
Monocytes Absolute: 0.6 10*3/uL (ref 0.1–1.0)
Monocytes Relative: 9 %
Neutro Abs: 4.9 10*3/uL (ref 1.7–7.7)
Neutrophils Relative %: 74 %
Platelets: 360 10*3/uL (ref 150–400)
RBC: 4.66 MIL/uL (ref 3.87–5.11)
RDW: 13.8 % (ref 11.5–15.5)
WBC: 6.7 10*3/uL (ref 4.0–10.5)
nRBC: 0 % (ref 0.0–0.2)

## 2018-08-12 LAB — COMPREHENSIVE METABOLIC PANEL
ALT: 18 U/L (ref 0–44)
AST: 20 U/L (ref 15–41)
Albumin: 4.3 g/dL (ref 3.5–5.0)
Alkaline Phosphatase: 96 U/L (ref 38–126)
Anion gap: 9 (ref 5–15)
BUN: 16 mg/dL (ref 8–23)
CO2: 27 mmol/L (ref 22–32)
Calcium: 9.7 mg/dL (ref 8.9–10.3)
Chloride: 102 mmol/L (ref 98–111)
Creatinine, Ser: 0.82 mg/dL (ref 0.44–1.00)
GFR calc Af Amer: >60 mL/min (ref >60)
GFR calc non Af Amer: >60 mL/min (ref >60)
Glucose, Bld: 77 mg/dL (ref 70–99)
Potassium: 3.3 mmol/L — ABNORMAL LOW (ref 3.5–5.1)
Sodium: 138 mmol/L (ref 135–145)
Total Bilirubin: 0.6 mg/dL (ref 0.3–1.2)
Total Protein: 8.1 g/dL (ref 6.5–8.1)

## 2018-08-12 LAB — LACTATE DEHYDROGENASE: LDH: 180 U/L (ref 98–192)

## 2018-08-12 MED ORDER — POTASSIUM CHLORIDE CRYS ER 20 MEQ PO TBCR: 20.0000 meq | EXTENDED_RELEASE_TABLET | Freq: Every day | ORAL | 0 refills | Status: DC

## 2018-08-12 NOTE — Progress Notes (Signed)
Hematology/Oncology Consult note [facility] [phone] [phone]   Patient Care Team: Marinda Elk, MD as PCP - General (Physician Assistant) Wellington Hampshire, MD as PCP - Cardiology (Cardiology)  REFERRING PROVIDER: Marinda Elk, MD  CHIEF COMPLAINTS/REASON FOR VISIT:  Evaluation of lymphoma  HISTORY OF PRESENTING ILLNESS:  Susan Mcmillan is a  73 y.o.  female with PMH listed below who was referred to me for evaluation of lymphoma  Patient previously followed up with [facility] oncology. Extensive medical record, image, pathology report review via care everywhere were performed by me. Patient initially presented with intermittent right arm/neck tingling, weakness dating back to 2016.  Initial brain imaging demonstrated a left side dural based lesion which was not most consistent with meningioma.  This was monitored.  In the fall 2018, these tingling episodes worsened in frequency and severity, going down on her trunk into her right leg.  Patient saw her primary care physician who performed another MRI which showed larger dural based lesion.  Patient was evaluated by Dr. Lacinda Axon who took her to the [facility] on 03/27/2017 where she underwent a subtotal resection.  She tolerated surgery well. 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 Patient finished adjuvant radiation in February 2019.  She followed up with [facility] oncology Dr. Wynetta Emery who recommends surveillance with MRI.  Patient was last seen by Dr. Wynetta Emery on 12/04/2017 and at that time she was recommended to repeat MRI in 4 months and follow-up in the clinic. Patient reports that he was never called for an appointment lost follow-up since then.  Patient reports doing well currently.  Not taking any antiseizure medications.  Patient was seen by primary care physician and would like to  be referred to cancer center for further management. She reports that chronic right side numbness and tingling/and weakness has been better since his surgery however has not returned to her baseline.  She needs help opening the cap of her water bottle. She reports that sometimes she does not have steady gait.  Otherwise no new complaints. Denies any headache, shortness of breath, cough, unintentional weight loss, fever or chills, night sweats.  She was accompanied by husband today.  She tells me that husband has been battling with prostate cancer Reports chronic history of IBS with weight fluctuation.  Appetite is good.  #Cancer screening, patient smokes couple of cigarettes a day, longstanding smoking history since age of 24.  Has been with lung cancer clinic.  She has had CT screening prescribed and due to the virus outbreak she is not able to get the CT done yet.  #She drinks 8 ounces of Budweiser every afternoon.  Current someday smoker, a few cigarettes daily. 27.5 pack year smoking  Review of Systems  Constitutional: Negative for appetite change, chills, fatigue and fever.  HENT:   Negative for hearing loss and voice change.   Eyes: Negative for eye problems.  Respiratory: Negative for chest tightness and cough.   Cardiovascular: Negative for chest pain.  Gastrointestinal: Negative for abdominal distention, abdominal pain and blood in stool.  Endocrine: Negative for hot flashes.  Genitourinary: Negative for difficulty urinating and frequency.   Musculoskeletal: Negative for arthralgias.       Chronic right-sided weakness/tingling  Skin: Negative for itching and rash.  Neurological: Negative for extremity weakness, headaches, seizures and speech difficulty.  Hematological: Negative for adenopathy.  Psychiatric/Behavioral: Negative for confusion.  MEDICAL HISTORY:  Past Medical History:  Diagnosis Date  . Anxiety   . CAD (coronary artery disease)    a. 11/2015 MV: mod  mid-distal ant ischemia, EF 58% w/ apical HK;  b. 11/2015 Cath: LM nl, LAD 80/147m(fills vial collats from RPDA & D1), D1 60ost, LCX small, nl, RCA 50p/m, RPDA nl, RPAV min irregs, RPl1/2/3 min irregs, EF 50-55%.  . Cancer of brain ([facility] 03/2017  . Dysuria   . History of kidney stones   . Hyperlipemia   . Hypertension   . Hypertensive heart disease   . IBS (irritable bowel syndrome)   . Myocardial infarction ([place]    SILENT  . Pernicious anemia   . Personal history of radiation therapy   . Personal history of tobacco use, presenting hazards to health 07/10/2015  . 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 Skin cancer   . Tobacco abuse    a. Still smoking ~ 1 cigarette/day.  .Marland KitchenUrethral caruncle   . Vertigo   . Vulvar adhesions 10/06/2013   perianal skin bridge noted at posterior fourchette and region of WLE surgical site  . Vulvar leukoplakia   . Vulvar pain     SURGICAL HISTORY: Past Surgical History:  Procedure Laterality Date  . ABDOMINAL HERNIA REPAIR  2014   Dr. WRochel Brome . ABDOMINAL HYSTERECTOMY  1978  . APPENDECTOMY    . BRAIN SURGERY    . CARDIAC CATHETERIZATION Left 12/07/2015   Procedure: Left Heart Cath and Coronary Angiography;  Surgeon: MWellington Hampshire MD;  [facility];  Service: Cardiovascular;  Laterality: Left;  . CATARACT EXTRACTION W/PHACO Right 05/28/2018   Procedure: CATARACT EXTRACTION PHACO AND INTRAOCULAR LENS PLACEMENT (ISan Miguel RIGHT;  Surgeon: HMarchia Meiers MD;  Location: [facility] ORS;  Service: Ophthalmology;  Laterality: Right;  [place]00:56 CDE 7.17 Fluid pack Lot # [id number]  . CERVICAL BIOPSY     Dr. DEnzo Bi . CHOLECYSTECTOMY    . wide local excision  07/2013   vin 3 w/ margins involoved    SOCIAL HISTORY: Social History   Socioeconomic History  . Marital status: Married    Spouse name: Not on file  . Number of children: Not on file  . Years of education: Not on file  . Highest education level: Not on file   Occupational History  . Occupation: retired  SScientific laboratory technician . Financial resource strain: Not on file  . Food insecurity:    Worry: Not on file    Inability: Not on file  . Transportation needs:    Medical: Not on file    Non-medical: Not on file  Tobacco Use  . Smoking status: Current Some Day Smoker    Packs/day: 0.50    Years: 55.00    Pack years: 27.50    Types: Cigarettes, E-cigarettes    Last attempt to quit: 12/07/2015    Years since quitting: 2.6  . Smokeless tobacco: Never Used  . Tobacco comment: smokes about 2 cigarettes a day  Substance and Sexual Activity  . Alcohol use: Yes    Alcohol/week: 0.0 standard drinks    Comment: 8 oz Budweiser every afternoon   . Drug use: No  . Sexual activity: Not Currently  Lifestyle  . Physical activity:    Days per week: Not on file    Minutes per session: Not on file  . Stress: Not on file  Relationships  . Social connections:  Talks on phone: Not on file    Gets together: Not on file    Attends religious service: Not on file    Active member of club or organization: Not on file    Attends meetings of clubs or organizations: Not on file    Relationship status: Not on file  . Intimate partner violence:    Fear of current or ex partner: Not on file    Emotionally abused: Not on file    Physically abused: Not on file    Forced sexual activity: Not on file  Other Topics Concern  . Not on file  Social History Narrative   Lives in [place]. Previously worked [employer]. Husband with dementia. Children 2 sons.    FAMILY HISTORY: Family History  Problem Relation Age of Onset  . Hyperlipidemia Sister   . Hypertension Sister   . Macular degeneration Sister   . Dementia Sister   . Congestive Heart Failure Mother   . Bone cancer Father   . Cancer Father        blood cancer  . Diabetes Neg Hx   . Heart disease Neg Hx     ALLERGIES:  is allergic to ace inhibitors; codeine; demerol [meperidine]; erythromycin; iodinated  diagnostic agents; other; and penicillins.  MEDICATIONS:  Current Outpatient Medications  Medication Sig Dispense Refill  . ALPRAZolam (XANAX) 0.25 MG tablet Take 2 tablets (0.5 mg total) by mouth at bedtime as needed. (Patient taking differently: Take 0.25 mg by mouth at bedtime as needed for sleep. ) 60 tablet 0  . atorvastatin (LIPITOR) 80 MG tablet Take 1 tablet (80 mg total) by mouth daily at 6 PM. (Patient taking differently: Take 80 mg by mouth daily. ) 90 tablet 3  . Cholecalciferol (VITAMIN D3 PO) Take 1 tablet by mouth daily.    . Cyanocobalamin (VITAMIN B-12 PO) Take 1 tablet by mouth daily.    . diphenhydrAMINE (BENADRYL) 25 MG tablet Take 25 mg by mouth at bedtime.    Marland Kitchen FLUoxetine (PROZAC) 20 MG capsule TAKE 1 CAPSULE BY MOUTH EVERY DAY (Patient taking differently: Take 20 mg by mouth daily. ) 90 capsule 3  . metoprolol succinate (TOPROL-XL) 50 MG 24 hr tablet TAKE 1 TABLET BY MOUTH DAILY WITH OR IMMEDIATELY FOLLOWING MEAL (Patient taking differently: Take 25 mg by mouth daily. ) 90 tablet 3  . tetrahydrozoline 0.05 % ophthalmic solution Place 1 drop into both eyes 3 (three) times daily as needed (dry eyes).    . triamterene-hydrochlorothiazide (MAXZIDE-25) 37.5-25 MG tablet TAKE 1/2 TAB BY MOUTH DAILY (Patient taking differently: Take 0.5 tablets by mouth daily. ) 90 tablet 1  . bacitracin ophthalmic ointment Place 1 application into both eyes daily. apply to eye     No current facility-administered medications for this visit.      PHYSICAL EXAMINATION: ECOG PERFORMANCE STATUS: 1 - Symptomatic but completely ambulatory Vitals:   08/12/18 1142  BP: 129/85  Pulse: 80  Resp: 18  Temp: (!) 97.4 F (36.3 C)   Filed Weights   08/12/18 1142  Weight: 158 lb 11.2 oz (72 kg)    Physical Exam Constitutional:      General: She is not in acute distress. HENT:     Head: Normocephalic and atraumatic.  Eyes:     General: No scleral icterus.    Pupils: Pupils are equal, round,  and reactive to light.  Neck:     Musculoskeletal: Normal range of motion and neck supple.  Cardiovascular:  Rate and Rhythm: Normal rate and regular rhythm.     Heart sounds: Normal heart sounds.  Pulmonary:     Effort: Pulmonary effort is normal. No respiratory distress.     Breath sounds: No wheezing.     Comments: Decreased breath sound bilaterally Abdominal:     General: Bowel sounds are normal. There is no distension.     Palpations: Abdomen is soft. There is no mass.     Tenderness: There is no abdominal tenderness.  Musculoskeletal: Normal range of motion.        General: No deformity.  Skin:    General: Skin is warm and dry.     Findings: No erythema or rash.  Neurological:     General: No focal deficit present.     Mental Status: She is alert and oriented to person, place, and time.     Cranial Nerves: No cranial nerve deficit.     Coordination: Coordination normal.  Psychiatric:        Behavior: Behavior normal.        Thought Content: Thought content normal.     RADIOGRAPHIC STUDIES: I have personally reviewed the radiological images as listed and agreed with the findings in the report.  CMP Latest Ref Rng & Units 08/12/2018  Glucose 70 - 99 mg/dL 77  BUN 8 - 23 mg/dL 16  Creatinine 0.44 - 1.00 mg/dL 0.82  Sodium 135 - 145 mmol/L 138  Potassium 3.5 - 5.1 mmol/L 3.3(L)  Chloride 98 - 111 mmol/L 102  CO2 22 - 32 mmol/L 27  Calcium 8.9 - 10.3 mg/dL 9.7  Total Protein 6.5 - 8.1 g/dL 8.1  Total Bilirubin 0.3 - 1.2 mg/dL 0.6  Alkaline Phos 38 - 126 U/L 96  AST 15 - 41 U/L 20  ALT 0 - 44 U/L 18   CBC Latest Ref Rng & Units 08/12/2018  WBC 4.0 - 10.5 K/uL 6.7  Hemoglobin 12.0 - 15.0 g/dL 14.2  Hematocrit 36.0 - 46.0 % 42.5  Platelets 150 - 400 K/uL 360    LABORATORY DATA:  I have reviewed the data as listed Lab Results  Component Value Date   WBC 6.7 08/12/2018   HGB 14.2 08/12/2018   HCT 42.5 08/12/2018   MCV 91.2 08/12/2018   PLT 360 08/12/2018    Recent Labs    08/12/18 1235  NA 138  K 3.3*  CL 102  CO2 27  GLUCOSE 77  BUN 16  CREATININE 0.82  CALCIUM 9.7  GFRNONAA >60  GFRAA >60  PROT 8.1  ALBUMIN 4.3  AST 20  ALT 18  ALKPHOS 96  BILITOT 0.6   Iron/TIBC/Ferritin/ %Sat    Component Value Date/Time   FERRITIN 24.0 01/14/2012 1434    03/27/2017 Brain tumor biopsies showed mature B cell lymphoma with plasmacytic differentiation.  Favoring marginal zone lymphoma  RADIOGRAPHIC STUDIES: I have personally reviewed the radiological images as listed and agreed with the findings in the report. 04/02/2018 bilateral screening mammogram-BI-RADS 1 12/04/2017 MRI brain with and without contrast Equivalent and nonspecific increased cranial bone enhancement at the craniotomy site compared to 07/31/2017.  Small left frontal subdural collection subjacent to craniotomy site has almost completely resolved.  Stable small subdural enhancement subjacent to the craniotomy site.  No obvious new intra-axial abnormality. 07/31/2017 MRI with and without contrast 01/17/2017 MRI brain with and without contrast Interval resection of the dural based enhancing mass on the left frontal lobe, residual same linear dural enhancement seen along the left anterior  and parietal lobes, could represent residual lymphoma.  Contacted postsurgical changes with small subdural collection along the left frontal lobe. Extra axial enhancing mass overlying the left hemisphere measuring up to 12 mm in thickness compatible with meningioma.  This shows significant enlargement since 2016.  There is mass-effect on the overlying cortex but no shift of the midline structures.  No brain edema.  ASSESSMENT & PLAN:  1. History of lymphoma   2. Hypokalemia   3. Tobacco abuse    Reviewed patient's previous oncology records and discussed with patient.  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  Patient has lost follow-up with oncology since last July. I recommend to obtain MRI brain with and without contrast.  Check basic work-up including CBC, CMP, LDH. Continue labs and MD assessment every 4 months for surveillance.  #Hypokalemia, potassium 3.3.  Recommend patient to take oral potassium chloride supplements 20 mEq daily x3.  Prescription sent to pharmacy. #Tobacco use, smoke cessation discussed with patient.  She has been following up with lung cancer screening program.  Orders Placed This Encounter  Procedures  . MR Brain W Wo Contrast    Standing Status:   Future    Standing Expiration Date:   08/12/2019    Order Specific Question:   ** REASON FOR EXAM (FREE TEXT)    Answer:   history of CNS marginal zole lymphoma, s/p resection and radiation at [facility]. follow up    Order Specific Question:   If indicated for the ordered procedure, I authorize the administration of contrast media per Radiology protocol    Answer:   Yes    Order Specific Question:   What is the patient's sedation requirement?    Answer:   No Sedation    Order Specific Question:   Does the patient have a pacemaker or implanted devices?    Answer:   No    Order Specific Question:   Use SRS Protocol?    Answer:   No    Order Specific Question:   Radiology Contrast Protocol - do NOT remove file path    Answer:   [email or web address]    Order Specific Question:   Preferred imaging location?    Answer:   [facility] (table limit-350lbs)  . CBC with Differential/Platelet    Standing Status:   Future    Number of Occurrences:   1    Standing Expiration Date:   08/12/2019  . Comprehensive metabolic panel    Standing Status:   Future    Number of Occurrences:   1    Standing Expiration Date:   08/12/2019  . Lactate dehydrogenase    Standing Status:   Future    Number of Occurrences:   1    Standing Expiration Date:   08/12/2019    All questions were answered. The patient knows to  call the clinic with any problems questions or concerns.  Return of visit: [place] visit after MRI brain for discussion of image results. Thank you for this kind referral and the opportunity to participate in the care of this patient. A copy of today's note is routed to referring provider  Total face to face encounter time for this patient visit was 60 min. >50% of the time was  spent in counseling and coordination of care.    Earlie Server, MD, PhD Hematology Oncology [facility] Pager- [phone] 08/12/2018

## 2018-08-12 NOTE — Progress Notes (Signed)
Patient here for initial visit. States he is on skin cancer treatment. "I put a cream on my legs and feet and it brings out the cancer."

## 2018-08-13 ENCOUNTER — Other Ambulatory Visit: Payer: Self-pay | Admitting: *Deleted

## 2018-08-13 NOTE — Telephone Encounter (Signed)
Incoming fax from [employer]- requesting a 90 days supply of potassium. Dr. Tasia Catchings. Do you approve.  potassium yesterday was 3.3

## 2018-08-18 ENCOUNTER — Ambulatory Visit: Payer: PPO

## 2018-08-21 ENCOUNTER — Inpatient Hospital Stay: Payer: PPO | Admitting: Oncology

## 2018-08-31 ENCOUNTER — Ambulatory Visit
Admission: RE | Admit: 2018-08-31 | Discharge: 2018-08-31 | Disposition: A | Discharge status: Home / Self Care | Payer: PPO | Source: Ambulatory Visit | Attending: Oncology | Admitting: Oncology

## 2018-08-31 ENCOUNTER — Other Ambulatory Visit: Payer: Self-pay

## 2018-08-31 DIAGNOSIS — Z8579 Personal history of other malignant neoplasms of lymphoid, hematopoietic and related tissues: Secondary | ICD-10-CM | POA: Diagnosis not present

## 2018-08-31 DIAGNOSIS — Z8572 Personal history of non-Hodgkin lymphomas: Secondary | ICD-10-CM

## 2018-08-31 DIAGNOSIS — R42 Dizziness and giddiness: Secondary | ICD-10-CM | POA: Diagnosis not present

## 2018-08-31 DIAGNOSIS — R51 Headache: Secondary | ICD-10-CM | POA: Diagnosis not present

## 2018-08-31 MED ORDER — GADOBUTROL 1 MMOL/ML IV SOLN
7.0000 mL | Freq: Once | INTRAVENOUS | Status: AC | PRN
  Administered 2018-08-31: 7 mL via INTRAVENOUS

## 2018-09-03 ENCOUNTER — Telehealth: Payer: Self-pay | Admitting: Oncology

## 2018-09-04 ENCOUNTER — Other Ambulatory Visit: Payer: Self-pay

## 2018-09-04 ENCOUNTER — Inpatient Hospital Stay (HOSPITAL_BASED_OUTPATIENT_CLINIC_OR_DEPARTMENT_OTHER): Payer: PPO | Admitting: Oncology

## 2018-09-04 ENCOUNTER — Encounter: Payer: Self-pay | Admitting: Oncology

## 2018-09-04 DIAGNOSIS — Z79899 Other long term (current) drug therapy: Secondary | ICD-10-CM | POA: Diagnosis not present

## 2018-09-04 DIAGNOSIS — Z8572 Personal history of non-Hodgkin lymphomas: Secondary | ICD-10-CM | POA: Diagnosis not present

## 2018-09-04 DIAGNOSIS — Z923 Personal history of irradiation: Secondary | ICD-10-CM | POA: Diagnosis not present

## 2018-09-04 DIAGNOSIS — Z87891 Personal history of nicotine dependence: Secondary | ICD-10-CM

## 2018-09-04 DIAGNOSIS — E876 Hypokalemia: Secondary | ICD-10-CM

## 2018-09-04 DIAGNOSIS — Z8579 Personal history of other malignant neoplasms of lymphoid, hematopoietic and related tissues: Secondary | ICD-10-CM

## 2018-09-06 NOTE — Progress Notes (Signed)
HEMATOLOGY-ONCOLOGY TeleHEALTH VISIT PROGRESS NOTE  I connected with [name] on 09/06/18 at  1:00 PM EDT by telephone visit and verified that I am speaking with the correct person using two identifiers. I discussed the limitations, risks, security and privacy concerns of performing an evaluation and management service by telemedicine and the availability of in-person appointments. I also discussed with the patient that there may be a patient responsible charge related to this service. The patient expressed understanding and agreed to proceed.   Other persons participating in the visit and their role in the encounter:  Geraldine Solar, [place], check in patient   Janeann Merl, RN, check in patient.   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     INTERVAL HISTORY Susan Mcmillan is a 73 y.o. female who has above history reviewed by me today presents for follow up visit for management of history of lymphoma and discussion of image results.  Problems and complaints are listed below:  During the interval she did MRI brain for surveillance.  She reports no new compliant today.   Review of Systems  Constitutional: Negative for appetite change, chills, fatigue and fever.  HENT:   Negative for hearing loss and voice change.   Eyes: Negative for eye problems.  Respiratory: Negative for chest tightness and cough.   Cardiovascular: Negative for chest pain.  Gastrointestinal: Negative for abdominal distention, abdominal pain and blood in stool.  Endocrine: Negative for hot flashes.  Genitourinary: Negative for difficulty urinating and frequency.   Musculoskeletal: Negative for arthralgias.       Chronic right side weakness .   Skin: Negative for itching and rash.  Neurological: Negative for extremity weakness.  Hematological: Negative for adenopathy.  Psychiatric/Behavioral: Negative for  confusion.    Past Medical History:  Diagnosis Date  . Anxiety   . CAD (coronary artery disease)    a. 11/2015 MV: mod mid-distal ant ischemia, EF 58% w/ apical HK;  b. 11/2015 Cath: LM nl, LAD 80/1101m (fills vial collats from RPDA & D1), D1 60ost, LCX small, nl, RCA 50p/m, RPDA nl, RPAV min irregs, RPl1/2/3 min irregs, EF 50-55%.  . Cancer of brain ([place]) 03/2017  . Dysuria   . History of kidney stones   . Hyperlipemia   . Hypertension   . Hypertensive heart disease   . IBS (irritable bowel syndrome)   . 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 Pernicious anemia   . Personal history of radiation therapy   . Personal history of tobacco use, presenting hazards to health 07/10/2015  . 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 Skin cancer   . Tobacco abuse    a. Still smoking ~ 1 cigarette/day.  Marland Kitchen Urethral caruncle   . Vertigo   . Vulvar adhesions 10/06/2013   perianal skin bridge noted at posterior fourchette and region of WLE surgical site  . Vulvar leukoplakia   . Vulvar pain    Past Surgical History:  Procedure Laterality Date  . ABDOMINAL HERNIA REPAIR  2014   Dr. Rochel Brome  . ABDOMINAL HYSTERECTOMY  1978  . APPENDECTOMY    . BRAIN SURGERY    . CARDIAC CATHETERIZATION Left 12/07/2015   Procedure: Left Heart Cath and Coronary Angiography;  Surgeon: Wellington Hampshire, MD;  Location: [facility];  Service: Cardiovascular;  Laterality: Left;  . CATARACT EXTRACTION W/PHACO Right 05/28/2018  Procedure: CATARACT EXTRACTION PHACO AND INTRAOCULAR LENS PLACEMENT ([place]) RIGHT;  Surgeon: Marchia Meiers, MD;  Location: [facility];  Service: Ophthalmology;  Laterality: Right;  [place] 00:56 CDE 7.17 Fluid pack Lot # [id number]  . CERVICAL BIOPSY     Dr. Enzo Bi  . CHOLECYSTECTOMY    . wide local excision  07/2013   vin 3 w/ margins involoved    Family History  Problem Relation Age of Onset  . Hyperlipidemia Sister   . Hypertension Sister   . Macular degeneration  Sister   . Dementia Sister   . Congestive Heart Failure Mother   . Bone cancer Father   . Cancer Father        blood cancer  . Diabetes Neg Hx   . Heart disease Neg Hx     Social History   Socioeconomic History  . Marital status: Married    Spouse name: Not on file  . Number of children: Not on file  . Years of education: Not on file  . Highest education level: Not on file  Occupational History  . Occupation: retired  Scientific laboratory technician  . Financial resource strain: Not on file  . Food insecurity:    Worry: Not on file    Inability: Not on file  . Transportation needs:    Medical: Not on file    Non-medical: Not on file  Tobacco Use  . Smoking status: Current Some Day Smoker    Packs/day: 0.50    Years: 55.00    Pack years: 27.50    Types: Cigarettes, E-cigarettes    Last attempt to quit: 12/07/2015    Years since quitting: 2.7  . Smokeless tobacco: Never Used  . Tobacco comment: smokes about 2 cigarettes a day  Substance and Sexual Activity  . Alcohol use: Yes    Alcohol/week: 0.0 standard drinks    Comment: 8 oz Budweiser every afternoon   . Drug use: No  . Sexual activity: Not Currently  Lifestyle  . Physical activity:    Days per week: Not on file    Minutes per session: Not on file  . Stress: Not on file  Relationships  . Social connections:    Talks on phone: Not on file    Gets together: Not on file    Attends religious service: Not on file    Active member of club or organization: Not on file    Attends meetings of clubs or organizations: Not on file    Relationship status: Not on file  . Intimate partner violence:    Fear of current or ex partner: Not on file    Emotionally abused: Not on file    Physically abused: Not on file    Forced sexual activity: Not on file  Other Topics Concern  . Not on file  Social History Narrative   Lives in [place]. Previously worked [employer]. Husband with dementia. Children 2 sons.    Current Outpatient Medications  on File Prior to Visit  Medication Sig Dispense Refill  . ALPRAZolam (XANAX) 0.25 MG tablet Take 2 tablets (0.5 mg total) by mouth at bedtime as needed. (Patient taking differently: Take 0.25 mg by mouth at bedtime as needed for sleep. ) 60 tablet 0  . atorvastatin (LIPITOR) 80 MG tablet Take 1 tablet (80 mg total) by mouth daily at 6 PM. (Patient taking differently: Take 80 mg by mouth daily. ) 90 tablet 3  . bacitracin ophthalmic ointment Place 1 application into both eyes daily. apply  to eye    . Cholecalciferol (VITAMIN D3 PO) Take 1 tablet by mouth daily.    . Cyanocobalamin (VITAMIN B-12 PO) Take 1 tablet by mouth daily.    . diphenhydrAMINE (BENADRYL) 25 MG tablet Take 25 mg by mouth at bedtime.    Marland Kitchen FLUoxetine (PROZAC) 20 MG capsule TAKE 1 CAPSULE BY MOUTH EVERY DAY (Patient taking differently: Take 20 mg by mouth daily. ) 90 capsule 3  . metoprolol succinate (TOPROL-XL) 50 MG 24 hr tablet TAKE 1 TABLET BY MOUTH DAILY WITH OR IMMEDIATELY FOLLOWING MEAL (Patient taking differently: Take 25 mg by mouth daily. ) 90 tablet 3  . potassium chloride SA (K-DUR,KLOR-CON) 20 MEQ tablet Take 1 tablet (20 mEq total) by mouth daily. 3 tablet 0  . tetrahydrozoline 0.05 % ophthalmic solution Place 1 drop into both eyes 3 (three) times daily as needed (dry eyes).    . triamterene-hydrochlorothiazide (MAXZIDE-25) 37.5-25 MG tablet TAKE 1/2 TAB BY MOUTH DAILY (Patient taking differently: Take 0.5 tablets by mouth daily. ) 90 tablet 1   No current facility-administered medications on file prior to visit.     Allergies  Allergen Reactions  . Ace Inhibitors Other (See Comments)  . Codeine   . Demerol [Meperidine] Other (See Comments)    Heavy sedation  . Erythromycin Nausea Only    "All Mycins cause nausea"  . Iodinated Diagnostic Agents Nausea And Vomiting  . Other Other (See Comments)  . Penicillins Nausea Only    Can do amoxicillin Has patient had a PCN reaction causing immediate rash,  facial/tongue/throat swelling, SOB or lightheadedness with hypotension: Yes Has patient had a PCN reaction causing severe rash involving mucus membranes or skin necrosis: No Has patient had a PCN reaction that required hospitalization No Has patient had a PCN reaction occurring within the last 10 years: No If all of the above answers are "NO", then may proceed with Cephalosporin use.        Observations/Objective: There were no vitals filed for this visit. There is no height or weight on file to calculate BMI.  Physical Exam  CBC    Component Value Date/Time   WBC 6.7 08/12/2018 1235   RBC 4.66 08/12/2018 1235   HGB 14.2 08/12/2018 1235   HGB 14.5 07/27/2013 1511   HCT 42.5 08/12/2018 1235   HCT 43.6 07/27/2013 1511   PLT 360 08/12/2018 1235   PLT 253 07/27/2013 1511   MCV 91.2 08/12/2018 1235   MCV 94 07/27/2013 1511   MCH 30.5 08/12/2018 1235   MCHC 33.4 08/12/2018 1235   RDW 13.8 08/12/2018 1235   RDW 12.9 07/27/2013 1511   LYMPHSABS 1.0 08/12/2018 1235   MONOABS 0.6 08/12/2018 1235   EOSABS 0.1 08/12/2018 1235   BASOSABS 0.1 08/12/2018 1235    CMP     Component Value Date/Time   NA 138 08/12/2018 1235   NA 136 07/27/2013 1511   K 3.3 (L) 08/12/2018 1235   K 3.6 07/27/2013 1511   CL 102 08/12/2018 1235   CL 102 07/27/2013 1511   CO2 27 08/12/2018 1235   CO2 30 07/27/2013 1511   GLUCOSE 77 08/12/2018 1235   GLUCOSE 87 07/27/2013 1511   BUN 16 08/12/2018 1235   BUN 13 07/27/2013 1511   CREATININE 0.82 08/12/2018 1235   CREATININE 0.77 05/27/2014 1600   CALCIUM 9.7 08/12/2018 1235   CALCIUM 9.3 07/27/2013 1511   PROT 8.1 08/12/2018 1235   PROT 6.7 02/06/2016 0953   ALBUMIN 4.3 08/12/2018  1235   ALBUMIN 4.2 02/06/2016 0953   AST 20 08/12/2018 1235   ALT 18 08/12/2018 1235   ALKPHOS 96 08/12/2018 1235   BILITOT 0.6 08/12/2018 1235   BILITOT 0.5 02/06/2016 0953   GFRNONAA >60 08/12/2018 1235   GFRNONAA >60 07/27/2013 1511   GFRAA >60 08/12/2018 1235    GFRAA >60 07/27/2013 1511     Assessment and Plan: 1. Hypokalemia   2. History of lymphoma    MRI brain was independently reviewed and discussed with patient.  No residual tumor.  Continue surveillance.  Labs are reviewed and reviewed with patient.  Continue labs and MD assessment every 3-4 months for surveillance  Hypokalemia, potassium chloride 87meq daily x 3 days.  Repeat bmp in 1 week.   Follow Up Instructions: Lab MD 3 months.    I discussed the assessment and treatment plan with the patient. The patient was provided an opportunity to ask questions and all were answered. The patient agreed with the plan and demonstrated an understanding of the instructions.  The patient was advised to call back or seek an in-person evaluation if the symptoms worsen or if the condition fails to improve as anticipated.   I provided 8 minutes of non face-to-face telephone visit time during this encounter, and > 50% was spent counseling as documented under my assessment & plan.  Earlie Server, MD 09/06/2018 1:16 PM

## 2018-09-10 ENCOUNTER — Ambulatory Visit: Admission: RE | Admit: 2018-09-10 | Payer: PPO | Source: Home / Self Care

## 2018-09-10 ENCOUNTER — Encounter: Admission: RE | Payer: Self-pay | Source: Home / Self Care

## 2018-09-10 ENCOUNTER — Other Ambulatory Visit: Payer: Self-pay

## 2018-09-10 SURGERY — PHACOEMULSIFICATION, CATARACT, WITH IOL INSERTION
Anesthesia: Choice | Laterality: Left

## 2018-09-11 ENCOUNTER — Other Ambulatory Visit: Payer: Self-pay

## 2018-09-11 ENCOUNTER — Inpatient Hospital Stay: Payer: PPO | Attending: Oncology

## 2018-09-11 DIAGNOSIS — Z8572 Personal history of non-Hodgkin lymphomas: Secondary | ICD-10-CM | POA: Insufficient documentation

## 2018-09-11 DIAGNOSIS — E876 Hypokalemia: Secondary | ICD-10-CM

## 2018-09-11 LAB — POTASSIUM: Potassium: 3.5 mmol/L (ref 3.5–5.1)

## 2018-09-18 ENCOUNTER — Ambulatory Visit: Payer: PPO

## 2018-10-09 DIAGNOSIS — C44729 Squamous cell carcinoma of skin of left lower limb, including hip: Secondary | ICD-10-CM | POA: Diagnosis not present

## 2018-10-09 DIAGNOSIS — D485 Neoplasm of uncertain behavior of skin: Secondary | ICD-10-CM | POA: Diagnosis not present

## 2018-10-13 DIAGNOSIS — M5136 Other intervertebral disc degeneration, lumbar region: Secondary | ICD-10-CM | POA: Diagnosis not present

## 2018-10-13 DIAGNOSIS — M5416 Radiculopathy, lumbar region: Secondary | ICD-10-CM | POA: Diagnosis not present

## 2018-10-26 ENCOUNTER — Telehealth: Payer: Self-pay | Admitting: *Deleted

## 2018-10-26 DIAGNOSIS — Z87891 Personal history of nicotine dependence: Secondary | ICD-10-CM

## 2018-10-26 DIAGNOSIS — Z122 Encounter for screening for malignant neoplasm of respiratory organs: Secondary | ICD-10-CM

## 2018-10-26 NOTE — Telephone Encounter (Signed)
Patient has been notified that annual lung cancer screening low dose CT scan is due currently or will be in near future. Confirmed that patient is within the age range of 55-77, and asymptomatic, (no signs or symptoms of lung cancer). Patient denies illness that would prevent curative treatment for lung cancer if found. Verified smoking history, (current, 30.8 pack year). The shared decision making visit was done 06/15/14. Patient is agreeable for CT scan being scheduled.

## 2018-10-29 ENCOUNTER — Other Ambulatory Visit: Payer: Self-pay

## 2018-10-29 ENCOUNTER — Ambulatory Visit
Admission: RE | Admit: 2018-10-29 | Discharge: 2018-10-29 | Disposition: A | Discharge status: Home / Self Care | Payer: PPO | Source: Ambulatory Visit | Attending: Oncology | Admitting: Oncology

## 2018-10-29 DIAGNOSIS — Z87891 Personal history of nicotine dependence: Secondary | ICD-10-CM | POA: Diagnosis not present

## 2018-10-29 DIAGNOSIS — Z122 Encounter for screening for malignant neoplasm of respiratory organs: Secondary | ICD-10-CM | POA: Diagnosis not present

## 2018-10-29 DIAGNOSIS — F17210 Nicotine dependence, cigarettes, uncomplicated: Secondary | ICD-10-CM | POA: Diagnosis not present

## 2018-10-30 ENCOUNTER — Encounter: Payer: Self-pay | Admitting: *Deleted

## 2018-11-20 DIAGNOSIS — M5416 Radiculopathy, lumbar region: Secondary | ICD-10-CM | POA: Diagnosis not present

## 2018-11-20 DIAGNOSIS — M5136 Other intervertebral disc degeneration, lumbar region: Secondary | ICD-10-CM | POA: Diagnosis not present

## 2018-11-20 DIAGNOSIS — M7062 Trochanteric bursitis, left hip: Secondary | ICD-10-CM | POA: Diagnosis not present

## 2018-11-20 DIAGNOSIS — M7061 Trochanteric bursitis, right hip: Secondary | ICD-10-CM | POA: Diagnosis not present

## 2018-11-25 DIAGNOSIS — D485 Neoplasm of uncertain behavior of skin: Secondary | ICD-10-CM | POA: Diagnosis not present

## 2018-11-25 DIAGNOSIS — Z85828 Personal history of other malignant neoplasm of skin: Secondary | ICD-10-CM | POA: Diagnosis not present

## 2018-11-25 DIAGNOSIS — Z08 Encounter for follow-up examination after completed treatment for malignant neoplasm: Secondary | ICD-10-CM | POA: Diagnosis not present

## 2018-11-25 DIAGNOSIS — C44729 Squamous cell carcinoma of skin of left lower limb, including hip: Secondary | ICD-10-CM | POA: Diagnosis not present

## 2018-12-01 ENCOUNTER — Ambulatory Visit (INDEPENDENT_AMBULATORY_CARE_PROVIDER_SITE_OTHER): Payer: PPO | Admitting: Obstetrics and Gynecology

## 2018-12-01 ENCOUNTER — Other Ambulatory Visit: Payer: Self-pay

## 2018-12-01 ENCOUNTER — Encounter: Payer: Self-pay | Admitting: Obstetrics and Gynecology

## 2018-12-01 VITALS — BP 144/72 | HR 70 | Ht 62.0 in | Wt 159.8 lb

## 2018-12-01 DIAGNOSIS — D071 Carcinoma in situ of vulva: Secondary | ICD-10-CM | POA: Diagnosis not present

## 2018-12-01 DIAGNOSIS — Z01419 Encounter for gynecological examination (general) (routine) without abnormal findings: Secondary | ICD-10-CM

## 2018-12-01 NOTE — Progress Notes (Signed)
Patient comes in today for annual exam . Patient is not due for anything. She is having some vaginal bumps and burning.

## 2018-12-01 NOTE — Progress Notes (Signed)
HPI:      [name] is a 73 y.o. [phone] who LMP was No LMP recorded. Patient is postmenopausal.  Subjective:   She presents today for her annual examination.  She has not been here for more than 3 years because she was diagnosed with a brain tumor and underwent surgery for it.  She then followed that with radiation.  She says she is doing well at this time. 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 She states that she had a colonoscopy which revealed carcinomatous polyps. She reports her last mammogram was normal. She reports that she is not interested in any type of genetic testing.    Hx: The following portions of the patient's history were reviewed and updated as appropriate:             She  has a past medical history of Anxiety, CAD (coronary artery disease), Cancer of brain ([place]) (03/2017), Dysuria, History of kidney stones, Hyperlipemia, Hypertension, Hypertensive heart disease, IBS (irritable bowel syndrome), Myocardial infarction ([place]), Pernicious anemia, Personal history of radiation therapy, Personal history of tobacco use, presenting hazards to health (07/10/2015), Severe vulvar dysplasia (05/2013), Skin cancer, Tobacco abuse, Urethral caruncle, Vertigo, Vulvar adhesions (10/06/2013), Vulvar leukoplakia, and Vulvar pain. She does not have any pertinent problems on file. She  has a past surgical history that includes Cholecystectomy; Appendectomy; Abdominal hernia repair (2014); Cervical biopsy; Abdominal hysterectomy (1978); wide local excision (07/2013); Cardiac catheterization (Left, 12/07/2015); Brain surgery; and Cataract extraction w/PHACO (Right, 05/28/2018). Her family history includes Bone cancer in her father; Cancer in her father; Congestive Heart Failure in her mother; Dementia in her sister; Hyperlipidemia in her sister; Hypertension in her sister; Macular degeneration in her  sister. She  reports that she has been smoking cigarettes and e-cigarettes. She has a 27.50 pack-year smoking history. She has never used smokeless tobacco. She reports current alcohol use. She reports that she does not use drugs. She has a current medication list which includes the following prescription(s): alprazolam, atorvastatin, bacitracin, cholecalciferol, cyanocobalamin, fluoxetine, metoprolol succinate, potassium chloride sa, tetrahydrozoline, triamterene-hydrochlorothiazide, and diphenhydramine. She is allergic to ace inhibitors; codeine; demerol [meperidine]; erythromycin; iodinated diagnostic agents; other; and penicillins.       Review of Systems:  Review of Systems  Constitutional: Denied constitutional symptoms, night sweats, recent illness, fatigue, fever, insomnia and weight loss.  Eyes: Denied eye symptoms, eye pain, photophobia, vision change and visual disturbance.  Ears/Nose/Throat/Neck: Denied ear, nose, throat or neck symptoms, hearing loss, nasal discharge, sinus congestion and sore throat.  Cardiovascular: Denied cardiovascular symptoms, arrhythmia, chest pain/pressure, edema, exercise intolerance, orthopnea and palpitations.  Respiratory: Denied pulmonary symptoms, asthma, pleuritic pain, productive sputum, cough, dyspnea and wheezing.  Gastrointestinal: Denied, gastro-esophageal reflux, melena, nausea and vomiting.  Genitourinary: Denied genitourinary symptoms including symptomatic vaginal discharge, pelvic relaxation issues, and urinary complaints.  Musculoskeletal: Denied musculoskeletal symptoms, stiffness, swelling, muscle weakness and myalgia.  Dermatologic: Denied dermatology symptoms, rash and scar.  Neurologic: Denied neurology symptoms, dizziness, headache, neck pain and syncope.  Psychiatric: Denied psychiatric symptoms, anxiety and depression.  Endocrine: Denied endocrine symptoms including hot flashes and night sweats.   Meds:   Current Outpatient  Medications on File Prior to Visit  Medication Sig Dispense Refill  . ALPRAZolam (XANAX) 0.25 MG tablet Take 2 tablets (0.5 mg total) by mouth at bedtime as needed. (Patient taking differently: Take 0.25 mg by mouth at bedtime as  needed for sleep. ) 60 tablet 0  . atorvastatin (LIPITOR) 80 MG tablet Take 1 tablet (80 mg total) by mouth daily at 6 PM. (Patient taking differently: Take 80 mg by mouth daily. ) 90 tablet 3  . bacitracin ophthalmic ointment Place 1 application into both eyes daily. apply to eye    . Cholecalciferol (VITAMIN D3 PO) Take 1 tablet by mouth daily.    . Cyanocobalamin (VITAMIN B-12 PO) Take 1 tablet by mouth daily.    . diphenhydrAMINE (BENADRYL) 25 MG tablet Take 25 mg by mouth at bedtime.    Marland Kitchen FLUoxetine (PROZAC) 20 MG capsule TAKE 1 CAPSULE BY MOUTH EVERY DAY (Patient taking differently: Take 20 mg by mouth daily. ) 90 capsule 3  . metoprolol succinate (TOPROL-XL) 50 MG 24 hr tablet TAKE 1 TABLET BY MOUTH DAILY WITH OR IMMEDIATELY FOLLOWING MEAL (Patient taking differently: Take 25 mg by mouth daily. ) 90 tablet 3  . potassium chloride SA (K-DUR,KLOR-CON) 20 MEQ tablet Take 1 tablet (20 mEq total) by mouth daily. 3 tablet 0  . tetrahydrozoline 0.05 % ophthalmic solution Place 1 drop into both eyes 3 (three) times daily as needed (dry eyes).    . triamterene-hydrochlorothiazide (MAXZIDE-25) 37.5-25 MG tablet TAKE 1/2 TAB BY MOUTH DAILY (Patient taking differently: Take 0.5 tablets by mouth daily. ) 90 tablet 1   No current facility-administered medications on file prior to visit.     Objective:     Vitals:   12/01/18 1411  BP: (!) 144/72  Pulse: 70              Physical examination General NAD, Conversant  HEENT Atraumatic; Op clear with mmm.  Normo-cephalic. Pupils reactive. Anicteric sclerae  Thyroid/Neck Smooth without nodularity or enlargement. Normal ROM.  Neck Supple.  Skin No rashes, lesions or ulceration. Normal palpated skin turgor. No nodularity.   Breasts: No masses or discharge.  Symmetric.  No axillary adenopathy.  Lungs: Clear to auscultation.No rales or wheezes. Normal Respiratory effort, no retractions.  Heart: NSR.  No murmurs or rubs appreciated. No periferal edema  Abdomen: Soft.  Non-tender.  No masses.  No HSM. No hernia  Extremities: Moves all appropriately.  Normal ROM for age. No lymphadenopathy.  Neuro: Oriented to PPT.  Normal mood. Normal affect.     Pelvic:   Vulva: Normal appearance.  No lesions.  A few small sebaceous cysts but otherwise no evidence of recurrent VIN 3  Vagina: No lesions or abnormalities noted.  Support: Normal pelvic support.  Urethra No masses tenderness or scarring.  Meatus Normal size without lesions or prolapse.  Cervix: Surgically absent   Anus: Normal exam.  No lesions.  Perineum: Normal exam.  No lesions.        Bimanual   Uterus: Surgically absent   Adnexae: No masses.  Non-tender to palpation.  Cul-de-sac: Negative for abnormality.     Assessment:    [id number] Patient Active Problem List   Diagnosis Date Noted  . CAD (coronary artery disease)   . Hypercholesteremia   . Hyperlipidemia LDL goal <70   . Hypertensive heart disease   . IBS (irritable bowel syndrome)   . Coronary artery disease involving native coronary artery of native heart with angina pectoris ([place])   . Coronary artery disease due to lipid rich plaque 11/20/2015  . Personal history of tobacco use, presenting hazards to health 07/10/2015  . Routine general medical examination at a health care facility 05/17/2015  . Constipation 02/07/2015  . Cervical  disc disorder with radiculopathy of cervical region 02/07/2015  . 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 Adjustment disorder with mixed anxiety and depressed mood 11/25/2014  . 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 Numbness and tingling of right arm and leg 11/25/2014  . Irritable bowel syndrome 11/18/2013   . Overweight (BMI 25.0-29.9) 11/18/2013  . Urethral caruncle 05/20/2013  . Hot flashes 02/11/2013  . Abnormal mammogram 08/07/2012  . Nonspecific abnormal electrocardiogram (ECG) (EKG) 05/07/2012  . Medicare annual wellness visit, subsequent 05/07/2012  . Tobacco abuse 05/07/2012  . Hypertension 01/06/2012  . Hyperlipidemia 01/06/2012     1. Well woman exam with routine gynecological exam   2. VIN III (vulvar intraepithelial neoplasia III)     Patient doing well at this time.   Plan:            1.  Basic Screening Recommendations The basic screening recommendations for asymptomatic women were discussed with the patient during her visit.  The age-appropriate recommendations were discussed with her and the rational for the tests reviewed.  When I am informed by the patient that another primary care physician has previously obtained the age-appropriate tests and they are up-to-date, only outstanding tests are ordered and referrals given as necessary.  Abnormal results of tests will be discussed with her when all of her results are completed.  Orders No orders of the defined types were placed in this encounter.   No orders of the defined types were placed in this encounter.       F/U  Return in about 1 year (around 12/01/2019) for Annual Physical.  Finis Bud, M.D. 12/01/2018 2:13 PM

## 2018-12-02 ENCOUNTER — Inpatient Hospital Stay: Payer: PPO | Attending: Oncology

## 2018-12-02 ENCOUNTER — Inpatient Hospital Stay (HOSPITAL_BASED_OUTPATIENT_CLINIC_OR_DEPARTMENT_OTHER): Payer: PPO | Admitting: Oncology

## 2018-12-02 ENCOUNTER — Other Ambulatory Visit: Payer: Self-pay

## 2018-12-02 VITALS — BP 120/73 | HR 67 | Temp 98.3°F | Resp 18 | Wt 159.9 lb

## 2018-12-02 DIAGNOSIS — R531 Weakness: Secondary | ICD-10-CM

## 2018-12-02 DIAGNOSIS — Z8572 Personal history of non-Hodgkin lymphomas: Secondary | ICD-10-CM | POA: Diagnosis not present

## 2018-12-02 DIAGNOSIS — Z85828 Personal history of other malignant neoplasm of skin: Secondary | ICD-10-CM

## 2018-12-02 DIAGNOSIS — Z8249 Family history of ischemic heart disease and other diseases of the circulatory system: Secondary | ICD-10-CM | POA: Diagnosis not present

## 2018-12-02 DIAGNOSIS — F17210 Nicotine dependence, cigarettes, uncomplicated: Secondary | ICD-10-CM | POA: Insufficient documentation

## 2018-12-02 DIAGNOSIS — I252 Old myocardial infarction: Secondary | ICD-10-CM

## 2018-12-02 DIAGNOSIS — R202 Paresthesia of skin: Secondary | ICD-10-CM | POA: Insufficient documentation

## 2018-12-02 DIAGNOSIS — Z923 Personal history of irradiation: Secondary | ICD-10-CM

## 2018-12-02 DIAGNOSIS — Z8579 Personal history of other malignant neoplasms of lymphoid, hematopoietic and related tissues: Secondary | ICD-10-CM | POA: Diagnosis not present

## 2018-12-02 DIAGNOSIS — Z79899 Other long term (current) drug therapy: Secondary | ICD-10-CM

## 2018-12-02 DIAGNOSIS — E785 Hyperlipidemia, unspecified: Secondary | ICD-10-CM | POA: Insufficient documentation

## 2018-12-02 DIAGNOSIS — K589 Irritable bowel syndrome without diarrhea: Secondary | ICD-10-CM | POA: Diagnosis not present

## 2018-12-02 DIAGNOSIS — E876 Hypokalemia: Secondary | ICD-10-CM

## 2018-12-02 DIAGNOSIS — R200 Anesthesia of skin: Secondary | ICD-10-CM

## 2018-12-02 DIAGNOSIS — I119 Hypertensive heart disease without heart failure: Secondary | ICD-10-CM | POA: Diagnosis not present

## 2018-12-02 DIAGNOSIS — Z720 Tobacco use: Secondary | ICD-10-CM

## 2018-12-02 LAB — CBC WITH DIFFERENTIAL/PLATELET
Abs Immature Granulocytes: 0.04 10*3/uL (ref 0.00–0.07)
Basophils Absolute: 0 10*3/uL (ref 0.0–0.1)
Basophils Relative: 0 %
Eosinophils Absolute: 0 10*3/uL (ref 0.0–0.5)
Eosinophils Relative: 0 %
HCT: 43.1 % (ref 36.0–46.0)
Hemoglobin: 14.2 g/dL (ref 12.0–15.0)
Immature Granulocytes: 0 %
Lymphocytes Relative: 12 %
Lymphs Abs: 1.1 10*3/uL (ref 0.7–4.0)
MCH: 30.3 pg (ref 26.0–34.0)
MCHC: 32.9 g/dL (ref 30.0–36.0)
MCV: 91.9 fL (ref 80.0–100.0)
Monocytes Absolute: 0.9 10*3/uL (ref 0.1–1.0)
Monocytes Relative: 9 %
Neutro Abs: 7.2 10*3/uL (ref 1.7–7.7)
Neutrophils Relative %: 79 %
Platelets: 335 10*3/uL (ref 150–400)
RBC: 4.69 MIL/uL (ref 3.87–5.11)
RDW: 13.7 % (ref 11.5–15.5)
WBC: 9.3 10*3/uL (ref 4.0–10.5)
nRBC: 0 % (ref 0.0–0.2)

## 2018-12-02 LAB — COMPREHENSIVE METABOLIC PANEL
ALT: 17 U/L (ref 0–44)
AST: 17 U/L (ref 15–41)
Albumin: 4 g/dL (ref 3.5–5.0)
Alkaline Phosphatase: 98 U/L (ref 38–126)
Anion gap: 10 (ref 5–15)
BUN: 15 mg/dL (ref 8–23)
CO2: 27 mmol/L (ref 22–32)
Calcium: 9.5 mg/dL (ref 8.9–10.3)
Chloride: 102 mmol/L (ref 98–111)
Creatinine, Ser: 0.9 mg/dL (ref 0.44–1.00)
GFR calc Af Amer: >60 mL/min (ref >60)
GFR calc non Af Amer: >60 mL/min (ref >60)
Glucose, Bld: 98 mg/dL (ref 70–99)
Potassium: 3.5 mmol/L (ref 3.5–5.1)
Sodium: 139 mmol/L (ref 135–145)
Total Bilirubin: 0.6 mg/dL (ref 0.3–1.2)
Total Protein: 7.6 g/dL (ref 6.5–8.1)

## 2018-12-02 LAB — LACTATE DEHYDROGENASE: LDH: 159 U/L (ref 98–192)

## 2018-12-02 LAB — URIC ACID: Uric Acid, Serum: 6.9 mg/dL (ref 2.5–7.1)

## 2018-12-02 NOTE — Progress Notes (Signed)
Patient does not offer any problems today.  She did have a low dose chest CT scan on 10/29/18.

## 2018-12-03 ENCOUNTER — Encounter: Payer: Self-pay | Admitting: Oncology

## 2018-12-03 NOTE — Progress Notes (Signed)
Hematology/Oncology Consult note [facility] [phone] [phone]   Patient Care Team: Marinda Elk, MD as PCP - General (Physician Assistant) Wellington Hampshire, MD as PCP - Cardiology (Cardiology)  REFERRING PROVIDER: Marinda Elk, MD  REASON FOR VISIT:  Follow up for history of lymphoma  HISTORY OF PRESENTING ILLNESS:  Susan Mcmillan is a  73 y.o.  female with PMH listed below who was referred to me for evaluation of lymphoma  Patient previously followed up with [facility] oncology. Extensive medical record, image, pathology report review via care everywhere were performed by me. Patient initially presented with intermittent right arm/neck tingling, weakness dating back to 2016.  Initial brain imaging demonstrated a left side dural based lesion which was not most consistent with meningioma.  This was monitored.  In the fall 2018, these tingling episodes worsened in frequency and severity, going down on her trunk into her right leg.  Patient saw her primary care physician who performed another MRI which showed larger dural based lesion.  Patient was evaluated by Dr. Lacinda Axon who took her to the [facility] on 03/27/2017 where she underwent a subtotal resection.  She tolerated surgery well. 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 No evidence of MALT1 (18q21) rearrangement   further staging included a PET scan and the bone marrow biopsy were negative. Patient finished adjuvant radiation in February 2019.  She followed up with [facility] oncology Dr. Wynetta Emery who recommends surveillance with MRI.  Patient was last seen by Dr. Wynetta Emery on 12/04/2017 and at that time she was recommended to repeat MRI in 4 months and follow-up in the clinic. Patient reports that he was never called for an appointment lost follow-up since then.   Patient reports doing well currently.  Not taking any antiseizure medications.  Patient was seen by primary care physician and would like to be referred to cancer center for further management. She reports that chronic right side numbness and tingling/and weakness has been better since his surgery however has not returned to her baseline.  She needs help opening the cap of her water bottle. She reports that sometimes she does not have steady gait.  Otherwise no new complaints. Denies any headache, shortness of breath, cough, unintentional weight loss, fever or chills, night sweats.  She was accompanied by husband today.  She tells me that husband has been battling with prostate cancer Reports chronic history of IBS with weight fluctuation.  Appetite is good.  #Cancer screening, patient smokes couple of cigarettes a day, longstanding smoking history since age of 57.  Has been with lung cancer clinic.  She has had CT screening prescribed and due to the virus outbreak she is not able to get the CT done yet.  #She drinks 8 ounces of Budweiser every afternoon.  Current someday smoker, a few cigarettes daily. 27.5 pack year smoking   INTERVAL HISTORY Susan Mcmillan is a 73 y.o. female who has above history reviewed by me today presents for follow up visit for history of CNS lymphoma. Problems and complaints are listed below: Patient reports feeling pretty well.  No dizziness, headache, she continues to have chronic balance issue which is at baseline.  No new symptoms.  Denies any fever, chills, night sweats, unintentional weight loss.  Review of Systems  Constitutional: Negative for appetite change, chills, fatigue and fever.  HENT:   Negative for hearing loss and voice change.  Eyes: Negative for eye problems.  Respiratory: Negative for chest tightness and cough.   Cardiovascular: Negative for chest pain.  Gastrointestinal: Negative for abdominal distention, abdominal pain and blood in  stool.  Endocrine: Negative for hot flashes.  Genitourinary: Negative for difficulty urinating and frequency.   Musculoskeletal: Negative for arthralgias.       Chronic right-sided weakness/tingling  Skin: Negative for itching and rash.  Neurological: Negative for extremity weakness, headaches, seizures and speech difficulty.  Hematological: Negative for adenopathy.  Psychiatric/Behavioral: Negative for confusion.    MEDICAL HISTORY:  Past Medical History:  Diagnosis Date  . Anxiety   . CAD (coronary artery disease)    a. 11/2015 MV: mod mid-distal ant ischemia, EF 58% w/ apical HK;  b. 11/2015 Cath: LM nl, LAD 80/175m(fills vial collats from RPDA & D1), D1 60ost, LCX small, nl, RCA 50p/m, RPDA nl, RPAV min irregs, RPl1/2/3 min irregs, EF 50-55%.  . Cancer of brain ([place] 03/2017  . Dysuria   . History of kidney stones   . Hyperlipemia   . Hypertension   . Hypertensive heart disease   . IBS (irritable bowel syndrome)   . Myocardial infarction ([place]    SILENT  . Pernicious anemia   . Personal history of radiation therapy   . Personal history of tobacco use, presenting hazards to health 07/10/2015  . 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 Skin cancer   . Tobacco abuse    a. Still smoking ~ 1 cigarette/day.  .Marland KitchenUrethral caruncle   . Vertigo   . Vulvar adhesions 10/06/2013   perianal skin bridge noted at posterior fourchette and region of WLE surgical site  . Vulvar leukoplakia   . Vulvar pain     SURGICAL HISTORY: Past Surgical History:  Procedure Laterality Date  . ABDOMINAL HERNIA REPAIR  2014   Dr. WRochel Brome . ABDOMINAL HYSTERECTOMY  1978  . APPENDECTOMY    . BRAIN SURGERY    . CARDIAC CATHETERIZATION Left 12/07/2015   Procedure: Left Heart Cath and Coronary Angiography;  Surgeon: MWellington Hampshire MD;  [facility];  Service: Cardiovascular;  Laterality: Left;  . CATARACT EXTRACTION W/PHACO Right 05/28/2018   Procedure: CATARACT  EXTRACTION PHACO AND INTRAOCULAR LENS PLACEMENT ([place];  Surgeon: HMarchia Meiers MD;  Location: [facility];  Service: Ophthalmology;  Laterality: Right;  [place]:56 CDE 7.17 Fluid pack Lot # [id number]  . CERVICAL BIOPSY     Dr. DEnzo Bi . CHOLECYSTECTOMY    . wide local excision  07/2013   vin 3 w/ margins involoved    SOCIAL HISTORY: Social History   Socioeconomic History  . Marital status: Married    Spouse name: Not on file  . Number of children: Not on file  . Years of education: Not on file  . Highest education level: Not on file  Occupational History  . Occupation: retired  SScientific laboratory technician . Financial resource strain: Not on file  . Food insecurity    Worry: Not on file    Inability: Not on file  . Transportation needs    Medical: Not on file    Non-medical: Not on file  Tobacco Use  . Smoking status: Current Some Day Smoker    Packs/day: 0.50    Years: 55.00    Pack years: 27.50    Types: Cigarettes, E-cigarettes    Last attempt to quit: 12/07/2015    Years since quitting: 2.9  .  Smokeless tobacco: Never Used  . Tobacco comment: smokes about 2 cigarettes a day  Substance and Sexual Activity  . Alcohol use: Yes    Alcohol/week: 0.0 standard drinks    Comment: 8 oz Budweiser every afternoon   . Drug use: No  . Sexual activity: Not Currently  Lifestyle  . Physical activity    Days per week: Not on file    Minutes per session: Not on file  . Stress: Not on file  Relationships  . Social Herbalist on phone: Not on file    Gets together: Not on file    Attends religious service: Not on file    Active member of club or organization: Not on file    Attends meetings of clubs or organizations: Not on file    Relationship status: Not on file  . Intimate partner violence    Fear of current or ex partner: Not on file    Emotionally abused: Not on file    Physically abused: Not on file    Forced sexual activity: Not on file  Other Topics Concern  .  Not on file  Social History Narrative   Lives in [place]. Previously worked [employer]. Husband with dementia. Children 2 sons.    FAMILY HISTORY: Family History  Problem Relation Age of Onset  . Hyperlipidemia Sister   . Hypertension Sister   . Macular degeneration Sister   . Dementia Sister   . Congestive Heart Failure Mother   . Bone cancer Father   . Cancer Father        blood cancer  . Diabetes Neg Hx   . Heart disease Neg Hx     ALLERGIES:  is allergic to ace inhibitors; codeine; demerol [meperidine]; erythromycin; iodinated diagnostic agents; other; and penicillins.  MEDICATIONS:  Current Outpatient Medications  Medication Sig Dispense Refill  . ALPRAZolam (XANAX) 0.25 MG tablet Take 2 tablets (0.5 mg total) by mouth at bedtime as needed. (Patient taking differently: Take 0.25 mg by mouth at bedtime as needed for sleep. ) 60 tablet 0  . atorvastatin (LIPITOR) 80 MG tablet Take 1 tablet (80 mg total) by mouth daily at 6 PM. (Patient taking differently: Take 80 mg by mouth daily. ) 90 tablet 3  . Cholecalciferol (VITAMIN D3 PO) Take 1 tablet by mouth daily.    . Cyanocobalamin (VITAMIN B-12 PO) Take 1 tablet by mouth daily.    . diphenhydrAMINE (BENADRYL) 25 MG tablet Take 25 mg by mouth at bedtime.    Marland Kitchen FLUoxetine (PROZAC) 20 MG capsule TAKE 1 CAPSULE BY MOUTH EVERY DAY (Patient taking differently: Take 20 mg by mouth daily. ) 90 capsule 3  . metoprolol succinate (TOPROL-XL) 50 MG 24 hr tablet TAKE 1 TABLET BY MOUTH DAILY WITH OR IMMEDIATELY FOLLOWING MEAL (Patient taking differently: Take 25 mg by mouth daily. ) 90 tablet 3  . Potassium 99 MG TABS Take 1 tablet by mouth daily. OTC    . triamterene-hydrochlorothiazide (MAXZIDE-25) 37.5-25 MG tablet TAKE 1/2 TAB BY MOUTH DAILY (Patient taking differently: Take 0.5 tablets by mouth daily. ) 90 tablet 1  . bacitracin ophthalmic ointment Place 1 application into both eyes daily. apply to eye    . potassium chloride SA  (K-DUR,KLOR-CON) 20 MEQ tablet Take 1 tablet (20 mEq total) by mouth daily. (Patient not taking: Reported on 12/02/2018) 3 tablet 0  . tetrahydrozoline 0.05 % ophthalmic solution Place 1 drop into both eyes 3 (three) times daily as needed (dry  eyes).     No current facility-administered medications for this visit.      PHYSICAL EXAMINATION: ECOG PERFORMANCE STATUS: 1 - Symptomatic but completely ambulatory Vitals:   12/02/18 1346  BP: 120/73  Pulse: 67  Resp: 18  Temp: 98.3 F (36.8 C)   Filed Weights   12/02/18 1346  Weight: 159 lb 14.4 oz (72.5 kg)    Physical Exam Constitutional:      General: She is not in acute distress. HENT:     Head: Normocephalic and atraumatic.  Eyes:     General: No scleral icterus.    Pupils: Pupils are equal, round, and reactive to light.  Neck:     Musculoskeletal: Normal range of motion and neck supple.  Cardiovascular:     Rate and Rhythm: Normal rate and regular rhythm.     Heart sounds: Normal heart sounds.  Pulmonary:     Effort: Pulmonary effort is normal. No respiratory distress.     Breath sounds: No wheezing.     Comments: Decreased breath sound bilaterally Abdominal:     General: Bowel sounds are normal. There is no distension.     Palpations: Abdomen is soft. There is no mass.     Tenderness: There is no abdominal tenderness.  Musculoskeletal: Normal range of motion.        General: No deformity.  Skin:    General: Skin is warm and dry.     Findings: No erythema or rash.  Neurological:     General: No focal deficit present.     Mental Status: She is alert and oriented to person, place, and time.     Cranial Nerves: No cranial nerve deficit.     Coordination: Coordination normal.  Psychiatric:        Behavior: Behavior normal.        Thought Content: Thought content normal.     RADIOGRAPHIC STUDIES: I have personally reviewed the radiological images as listed and agreed with the findings in the report.  CMP Latest  Ref Rng & Units 12/02/2018  Glucose 70 - 99 mg/dL 98  BUN 8 - 23 mg/dL 15  Creatinine 0.44 - 1.00 mg/dL 0.90  Sodium 135 - 145 mmol/L 139  Potassium 3.5 - 5.1 mmol/L 3.5  Chloride 98 - 111 mmol/L 102  CO2 22 - 32 mmol/L 27  Calcium 8.9 - 10.3 mg/dL 9.5  Total Protein 6.5 - 8.1 g/dL 7.6  Total Bilirubin 0.3 - 1.2 mg/dL 0.6  Alkaline Phos 38 - 126 U/L 98  AST 15 - 41 U/L 17  ALT 0 - 44 U/L 17   CBC Latest Ref Rng & Units 12/02/2018  WBC 4.0 - 10.5 K/uL 9.3  Hemoglobin 12.0 - 15.0 g/dL 14.2  Hematocrit 36.0 - 46.0 % 43.1  Platelets 150 - 400 K/uL 335    LABORATORY DATA:  I have reviewed the data as listed Lab Results  Component Value Date   WBC 9.3 12/02/2018   HGB 14.2 12/02/2018   HCT 43.1 12/02/2018   MCV 91.9 12/02/2018   PLT 335 12/02/2018   Recent Labs    08/12/18 1235 09/11/18 1105 12/02/18 1309  NA 138  --  139  K 3.3* 3.5 3.5  CL 102  --  102  CO2 27  --  27  GLUCOSE 77  --  98  BUN 16  --  15  CREATININE 0.82  --  0.90  CALCIUM 9.7  --  9.5  GFRNONAA >60  --  >  60  GFRAA >60  --  >60  PROT 8.1  --  7.6  ALBUMIN 4.3  --  4.0  AST 20  --  17  ALT 18  --  17  ALKPHOS 96  --  98  BILITOT 0.6  --  0.6   Iron/TIBC/Ferritin/ %Sat    Component Value Date/Time   FERRITIN 24.0 01/14/2012 1434    03/27/2017 Brain tumor biopsies showed mature B cell lymphoma with plasmacytic differentiation.  Favoring marginal zone lymphoma  RADIOGRAPHIC STUDIES: I have personally reviewed the radiological images as listed and agreed with the findings in the report. 04/02/2018 bilateral screening mammogram-BI-RADS 1 12/04/2017 MRI brain with and without contrast Equivalent and nonspecific increased cranial bone enhancement at the craniotomy site compared to 07/31/2017.  Small left frontal subdural collection subjacent to craniotomy site has almost completely resolved.  Stable small subdural enhancement subjacent to the craniotomy site.  No obvious new intra-axial abnormality.  07/31/2017 MRI with and without contrast 01/17/2017 MRI brain with and without contrast Interval resection of the dural based enhancing mass on the left frontal lobe, residual same linear dural enhancement seen along the left anterior and parietal lobes, could represent residual lymphoma.  Contacted postsurgical changes with small subdural collection along the left frontal lobe. Extra axial enhancing mass overlying the left hemisphere measuring up to 12 mm in thickness compatible with meningioma.  This shows significant enlargement since 2016.  There is mass-effect on the overlying cortex but no shift of the midline structures.  No brain edema.  08/31/2018 MRI brain with and without contrast showed status post resection of extra axial left convexity tumor without residual lesion.  Otherwise normal brain MRI.  ASSESSMENT & PLAN:  1. History of lymphoma   Labs reviewed and discussed with patient. Clinically she has been doing satisfactorily. No new symptoms. I would obtain another annual MRI brain for surveillance. Marland Kitchen #Tobacco use, smoke cessation discussed with patient.   Patient has had lung cancer screening CT done on 10/29/2018 which showed benign findings.  Orders Placed This Encounter  Procedures  . CBC with Differential/Platelet    Standing Status:   Future    Standing Expiration Date:   06/13/2019  . Comprehensive metabolic panel    Standing Status:   Future    Standing Expiration Date:   06/13/2019  . Lactate dehydrogenase    Standing Status:   Future    Standing Expiration Date:   06/13/2019  . Uric acid    Standing Status:   Future    Standing Expiration Date:   06/13/2019    All questions were answered. The patient knows to call the clinic with any problems questions or concerns.  Return of visit:6 months.   Earlie Server, MD, PhD Hematology Oncology [place] at [facility] 12/03/2018

## 2018-12-15 DIAGNOSIS — H2512 Age-related nuclear cataract, left eye: Secondary | ICD-10-CM | POA: Diagnosis not present

## 2018-12-15 DIAGNOSIS — H43813 Vitreous degeneration, bilateral: Secondary | ICD-10-CM | POA: Diagnosis not present

## 2018-12-17 DIAGNOSIS — M5416 Radiculopathy, lumbar region: Secondary | ICD-10-CM | POA: Diagnosis not present

## 2018-12-17 DIAGNOSIS — M5136 Other intervertebral disc degeneration, lumbar region: Secondary | ICD-10-CM | POA: Diagnosis not present

## 2018-12-25 DIAGNOSIS — H2512 Age-related nuclear cataract, left eye: Secondary | ICD-10-CM | POA: Diagnosis not present

## 2018-12-31 ENCOUNTER — Other Ambulatory Visit: Payer: Self-pay

## 2018-12-31 ENCOUNTER — Encounter: Payer: Self-pay | Admitting: *Deleted

## 2018-12-31 ENCOUNTER — Other Ambulatory Visit: Payer: Self-pay | Admitting: Gastroenterology

## 2018-12-31 DIAGNOSIS — R1314 Dysphagia, pharyngoesophageal phase: Secondary | ICD-10-CM

## 2018-12-31 DIAGNOSIS — Z86010 Personal history of colonic polyps: Secondary | ICD-10-CM | POA: Diagnosis not present

## 2018-12-31 DIAGNOSIS — R1013 Epigastric pain: Secondary | ICD-10-CM | POA: Diagnosis not present

## 2019-01-01 ENCOUNTER — Other Ambulatory Visit
Admission: RE | Admit: 2019-01-01 | Discharge: 2019-01-01 | Disposition: A | Discharge status: Home / Self Care | Payer: PPO | Source: Ambulatory Visit | Attending: Ophthalmology | Admitting: Ophthalmology

## 2019-01-01 DIAGNOSIS — Z20828 Contact with and (suspected) exposure to other viral communicable diseases: Secondary | ICD-10-CM | POA: Diagnosis not present

## 2019-01-01 DIAGNOSIS — H2512 Age-related nuclear cataract, left eye: Secondary | ICD-10-CM | POA: Insufficient documentation

## 2019-01-01 DIAGNOSIS — Z01812 Encounter for preprocedural laboratory examination: Secondary | ICD-10-CM | POA: Insufficient documentation

## 2019-01-01 LAB — SARS CORONAVIRUS 2 (TAT 6-24 HRS): SARS Coronavirus 2: NEGATIVE

## 2019-01-01 NOTE — Discharge Instructions (Signed)

## 2019-01-05 ENCOUNTER — Ambulatory Visit
Admission: RE | Admit: 2019-01-05 | Discharge: 2019-01-05 | Disposition: A | Discharge status: Home / Self Care | Payer: PPO | Source: Ambulatory Visit | Attending: Gastroenterology | Admitting: Gastroenterology

## 2019-01-05 ENCOUNTER — Other Ambulatory Visit: Payer: Self-pay | Admitting: Gastroenterology

## 2019-01-05 ENCOUNTER — Ambulatory Visit: Payer: PPO

## 2019-01-05 ENCOUNTER — Other Ambulatory Visit: Payer: Self-pay

## 2019-01-05 DIAGNOSIS — R1314 Dysphagia, pharyngoesophageal phase: Secondary | ICD-10-CM

## 2019-01-05 DIAGNOSIS — K219 Gastro-esophageal reflux disease without esophagitis: Secondary | ICD-10-CM | POA: Diagnosis not present

## 2019-01-06 ENCOUNTER — Ambulatory Visit
Admission: RE | Admit: 2019-01-06 | Discharge: 2019-01-06 | Disposition: A | Discharge status: Home / Self Care | Payer: PPO | Attending: Ophthalmology | Admitting: Ophthalmology

## 2019-01-06 ENCOUNTER — Ambulatory Visit: Payer: PPO | Admitting: Anesthesiology

## 2019-01-06 ENCOUNTER — Encounter
Admission: RE | Disposition: A | Discharge status: Home / Self Care | Payer: Self-pay | Source: Home / Self Care | Attending: Ophthalmology

## 2019-01-06 DIAGNOSIS — I2510 Atherosclerotic heart disease of native coronary artery without angina pectoris: Secondary | ICD-10-CM | POA: Insufficient documentation

## 2019-01-06 DIAGNOSIS — I252 Old myocardial infarction: Secondary | ICD-10-CM | POA: Diagnosis not present

## 2019-01-06 DIAGNOSIS — Z79899 Other long term (current) drug therapy: Secondary | ICD-10-CM | POA: Insufficient documentation

## 2019-01-06 DIAGNOSIS — H2512 Age-related nuclear cataract, left eye: Secondary | ICD-10-CM | POA: Diagnosis not present

## 2019-01-06 DIAGNOSIS — I10 Essential (primary) hypertension: Secondary | ICD-10-CM | POA: Diagnosis not present

## 2019-01-06 DIAGNOSIS — F419 Anxiety disorder, unspecified: Secondary | ICD-10-CM | POA: Diagnosis not present

## 2019-01-06 DIAGNOSIS — Z85841 Personal history of malignant neoplasm of brain: Secondary | ICD-10-CM | POA: Diagnosis not present

## 2019-01-06 DIAGNOSIS — Z85828 Personal history of other malignant neoplasm of skin: Secondary | ICD-10-CM | POA: Insufficient documentation

## 2019-01-06 DIAGNOSIS — H25812 Combined forms of age-related cataract, left eye: Secondary | ICD-10-CM | POA: Diagnosis not present

## 2019-01-06 DIAGNOSIS — Z923 Personal history of irradiation: Secondary | ICD-10-CM | POA: Insufficient documentation

## 2019-01-06 HISTORY — DX: Presence of dental prosthetic device (complete) (partial): Z97.2

## 2019-01-06 HISTORY — PX: CATARACT EXTRACTION W/PHACO: SHX586

## 2019-01-06 SURGERY — PHACOEMULSIFICATION, CATARACT, WITH IOL INSERTION
Anesthesia: Monitor Anesthesia Care | Site: Eye | Laterality: Left

## 2019-01-06 MED ORDER — ARMC OPHTHALMIC DILATING DROPS
1.0000 "application " | OPHTHALMIC | Status: DC | PRN
  Administered 2019-01-06 (×3): 1 via OPHTHALMIC

## 2019-01-06 MED ORDER — TETRACAINE HCL 0.5 % OP SOLN
1.0000 [drp] | OPHTHALMIC | Status: DC | PRN
  Administered 2019-01-06 (×3): 1 [drp] via OPHTHALMIC

## 2019-01-06 MED ORDER — LIDOCAINE HCL (PF) 2 % IJ SOLN
INTRAOCULAR | Status: DC | PRN
  Administered 2019-01-06: 1 mL

## 2019-01-06 MED ORDER — CEFUROXIME OPHTHALMIC INJECTION 1 MG/0.1 ML
INJECTION | OPHTHALMIC | Status: DC | PRN
  Administered 2019-01-06: 0.1 mL via INTRACAMERAL

## 2019-01-06 MED ORDER — BRIMONIDINE TARTRATE-TIMOLOL 0.2-0.5 % OP SOLN
OPHTHALMIC | Status: DC | PRN
  Administered 2019-01-06: 1 [drp] via OPHTHALMIC

## 2019-01-06 MED ORDER — MOXIFLOXACIN HCL 0.5 % OP SOLN
1.0000 [drp] | OPHTHALMIC | Status: DC | PRN
  Administered 2019-01-06 (×3): 1 [drp] via OPHTHALMIC

## 2019-01-06 MED ORDER — MIDAZOLAM HCL 2 MG/2ML IJ SOLN
INTRAMUSCULAR | Status: DC | PRN
  Administered 2019-01-06: 2 mg via INTRAVENOUS

## 2019-01-06 MED ORDER — FENTANYL CITRATE (PF) 100 MCG/2ML IJ SOLN
INTRAMUSCULAR | Status: DC | PRN
  Administered 2019-01-06: 50 ug via INTRAVENOUS

## 2019-01-06 MED ORDER — NA HYALUR & NA CHOND-NA HYALUR 0.4-0.35 ML IO KIT
PACK | INTRAOCULAR | Status: DC | PRN
  Administered 2019-01-06: 1 mL via INTRAOCULAR

## 2019-01-06 SURGICAL SUPPLY — 16 items
CANNULA ANT/CHMB 27G (MISCELLANEOUS) ×1 IMPLANT
CANNULA ANT/CHMB 27GA (MISCELLANEOUS) ×2 IMPLANT
GLOVE SURG LX 7.5 STRW (GLOVE) ×1
GLOVE SURG LX STRL 7.5 STRW (GLOVE) ×1 IMPLANT
GLOVE SURG TRIUMPH 8.0 PF LTX (GLOVE) ×2 IMPLANT
GOWN STRL REUS W/ TWL LRG LVL3 (GOWN DISPOSABLE) ×2 IMPLANT
GOWN STRL REUS W/TWL LRG LVL3 (GOWN DISPOSABLE) ×2
LENS IOL ACRYSOF IQ 21.5 (Intraocular Lens) ×1 IMPLANT
MARKER SKIN DUAL TIP RULER LAB (MISCELLANEOUS) ×2 IMPLANT
PACK CATARACT BRASINGTON (MISCELLANEOUS) ×2 IMPLANT
PACK EYE AFTER SURG (MISCELLANEOUS) ×2 IMPLANT
PACK OPTHALMIC (MISCELLANEOUS) ×2 IMPLANT
SYR 3ML LL SCALE MARK (SYRINGE) ×2 IMPLANT
SYR TB 1ML LUER SLIP (SYRINGE) ×2 IMPLANT
WATER STERILE IRR 500ML POUR (IV SOLUTION) ×2 IMPLANT
WIPE NON LINTING 3.25X3.25 (MISCELLANEOUS) ×2 IMPLANT

## 2019-01-06 NOTE — Transfer of Care (Signed)
Immediate Anesthesia Transfer of Care Note  Patient: Susan Mcmillan  Procedure(s) Performed: CATARACT EXTRACTION PHACO AND INTRAOCULAR LENS PLACEMENT (IOC) LEFT  00:50.3  19.3%  9.71 (Left Eye)  Patient Location: PACU  Anesthesia Type: MAC  Level of Consciousness: awake, alert  and patient cooperative  Airway and Oxygen Therapy: Patient Spontanous Breathing and Patient connected to supplemental oxygen  Post-op Assessment: Post-op Vital signs reviewed, Patient's Cardiovascular Status Stable, Respiratory Function Stable, Patent Airway and No signs of Nausea or vomiting  Post-op Vital Signs: Reviewed and stable  Complications: No apparent anesthesia complications

## 2019-01-06 NOTE — Anesthesia Postprocedure Evaluation (Signed)
Anesthesia Post Note  Patient: Susan Mcmillan  Procedure(s) Performed: CATARACT EXTRACTION PHACO AND INTRAOCULAR LENS PLACEMENT (IOC) LEFT  00:50.3  19.3%  9.71 (Left Eye)  Patient location during evaluation: PACU Anesthesia Type: MAC Level of consciousness: awake and alert Pain management: pain level controlled Vital Signs Assessment: post-procedure vital signs reviewed and stable Respiratory status: spontaneous breathing, nonlabored ventilation, respiratory function stable and patient connected to nasal cannula oxygen Cardiovascular status: stable and blood pressure returned to baseline Postop Assessment: no apparent nausea or vomiting Anesthetic complications: no    Wanda Plump Kazoua Gossen

## 2019-01-06 NOTE — Anesthesia Preprocedure Evaluation (Signed)
Anesthesia Evaluation  Patient identified by MRN, date of birth, ID band Patient awake    Reviewed: Allergy & Precautions, NPO status , Patient's Chart, lab work & pertinent test results, reviewed documented beta blocker date and time   Airway Mallampati: II  TM Distance: >3 FB     Dental  (+) Chipped   Pulmonary Current Smoker and Patient abstained from smoking., former smoker,    Pulmonary exam normal        Cardiovascular hypertension, Pt. on medications and Pt. on home beta blockers + angina + CAD and + Past MI  Normal cardiovascular exam     Neuro/Psych PSYCHIATRIC DISORDERS Anxiety  Neuromuscular disease    GI/Hepatic   Endo/Other    Renal/GU      Musculoskeletal   Abdominal   Peds  Hematology  (+) anemia ,   Anesthesia Other Findings Smokes. No cardiac stents.  Reproductive/Obstetrics                             Anesthesia Physical  Anesthesia Plan  ASA: III  Anesthesia Plan: MAC   Post-op Pain Management:    Induction:   PONV Risk Score and Plan:   Airway Management Planned:   Additional Equipment:   Intra-op Plan:   Post-operative Plan:   Informed Consent: I have reviewed the patients History and Physical, chart, labs and discussed the procedure including the risks, benefits and alternatives for the proposed anesthesia with the patient or authorized representative who has indicated his/her understanding and acceptance.       Plan Discussed with: CRNA, Anesthesiologist and Surgeon  Anesthesia Plan Comments:         Anesthesia Quick Evaluation

## 2019-01-06 NOTE — H&P (Signed)

## 2019-01-06 NOTE — Anesthesia Procedure Notes (Signed)
Procedure Name: MAC Performed by: Oniyah Rohe, CRNA Pre-anesthesia Checklist: Patient identified, Emergency Drugs available, Suction available, Timeout performed and Patient being monitored Patient Re-evaluated:Patient Re-evaluated prior to induction Oxygen Delivery Method: Nasal cannula Placement Confirmation: positive ETCO2       

## 2019-01-06 NOTE — Op Note (Signed)
OPERATIVE NOTE  EYANNA DARGA [id number] 01/06/2019   PREOPERATIVE DIAGNOSIS:  Nuclear sclerotic cataract left eye. H25.12   POSTOPERATIVE DIAGNOSIS:    Nuclear sclerotic cataract left eye.     PROCEDURE:  Phacoemusification with posterior chamber intraocular lens placement of the left eye   LENS:   Implant Name Type Inv. Item Serial No. Manufacturer Lot No. LRB No. Used Action  LENS IOL ACRYSOF IQ 21.5 - [id number] Intraocular Lens LENS IOL ACRYSOF IQ 21.5 [id number] ALCON  Left 1 Implanted        ULTRASOUND TIME: 19  % of 0 minutes 50 seconds, CDE 9.7  SURGEON:  Wyonia Hough, MD   ANESTHESIA:  Topical with tetracaine drops and 2% Xylocaine jelly, augmented with 1% preservative-free intracameral lidocaine.    COMPLICATIONS:  None.   DESCRIPTION OF PROCEDURE:  The patient was identified in the holding room and transported to the operating room and placed in the supine position under the operating microscope.  The left eye was identified as the operative eye and it was prepped and draped in the usual sterile ophthalmic fashion.   A 1 millimeter clear-corneal paracentesis was made at the 1:30 position.  0.5 ml of preservative-free 1% lidocaine was injected into the anterior chamber.  The anterior chamber was filled with Viscoat viscoelastic.  A 2.4 millimeter keratome was used to make a near-clear corneal incision at the 10:30 position.  .  A curvilinear capsulorrhexis was made with a cystotome and capsulorrhexis forceps.  Balanced salt solution was used to hydrodissect and hydrodelineate the nucleus.   Phacoemulsification was then used in stop and chop fashion to remove the lens nucleus and epinucleus.  The remaining cortex was then removed using the irrigation and aspiration handpiece. Provisc was then placed into the capsular bag to distend it for lens placement.  A lens was then injected into the capsular bag.  The remaining viscoelastic was aspirated.   Wounds were  hydrated with balanced salt solution.  The anterior chamber was inflated to a physiologic pressure with balanced salt solution.  No wound leaks were noted. Cefuroxime 0.1 ml of a 10mg /ml solution was injected into the anterior chamber for a dose of 1 mg of intracameral antibiotic at the completion of the case.   Timolol and Brimonidine drops were applied to the eye.  The patient was taken to the recovery room in stable condition without complications of anesthesia or surgery.  Parmvir Boomer 01/06/2019, 2:24 PM

## 2019-01-07 ENCOUNTER — Encounter: Payer: Self-pay | Admitting: Ophthalmology

## 2019-01-14 DIAGNOSIS — C44722 Squamous cell carcinoma of skin of right lower limb, including hip: Secondary | ICD-10-CM | POA: Diagnosis not present

## 2019-01-14 DIAGNOSIS — D485 Neoplasm of uncertain behavior of skin: Secondary | ICD-10-CM | POA: Diagnosis not present

## 2019-01-14 DIAGNOSIS — D0471 Carcinoma in situ of skin of right lower limb, including hip: Secondary | ICD-10-CM | POA: Diagnosis not present

## 2019-01-25 ENCOUNTER — Other Ambulatory Visit: Payer: Self-pay

## 2019-01-25 MED ORDER — ATORVASTATIN CALCIUM 80 MG PO TABS: 80.0000 mg | ORAL_TABLET | Freq: Every day | ORAL | 0 refills | Status: DC

## 2019-01-25 NOTE — Telephone Encounter (Signed)
Refill sent for Atorvastatin.  Patient needs a follow up appointment.  Message sent to the scheduling pool.

## 2019-02-05 ENCOUNTER — Other Ambulatory Visit
Admission: RE | Admit: 2019-02-05 | Discharge: 2019-02-05 | Disposition: A | Discharge status: Home / Self Care | Payer: PPO | Source: Ambulatory Visit | Attending: Gastroenterology | Admitting: Gastroenterology

## 2019-02-05 DIAGNOSIS — Z01812 Encounter for preprocedural laboratory examination: Secondary | ICD-10-CM | POA: Diagnosis not present

## 2019-02-05 DIAGNOSIS — Z20828 Contact with and (suspected) exposure to other viral communicable diseases: Secondary | ICD-10-CM | POA: Insufficient documentation

## 2019-02-06 LAB — SARS CORONAVIRUS 2 (TAT 6-24 HRS): SARS Coronavirus 2: NEGATIVE

## 2019-02-08 ENCOUNTER — Encounter: Payer: Self-pay | Admitting: *Deleted

## 2019-02-09 ENCOUNTER — Encounter: Payer: Self-pay | Admitting: Anesthesiology

## 2019-02-09 ENCOUNTER — Ambulatory Visit: Payer: PPO | Admitting: Anesthesiology

## 2019-02-09 ENCOUNTER — Ambulatory Visit
Admission: RE | Admit: 2019-02-09 | Discharge: 2019-02-09 | Disposition: A | Discharge status: Home / Self Care | Payer: PPO | Attending: Gastroenterology | Admitting: Gastroenterology

## 2019-02-09 ENCOUNTER — Encounter
Admission: RE | Disposition: A | Discharge status: Home / Self Care | Payer: Self-pay | Source: Home / Self Care | Attending: Gastroenterology

## 2019-02-09 ENCOUNTER — Other Ambulatory Visit: Payer: Self-pay

## 2019-02-09 DIAGNOSIS — Q438 Other specified congenital malformations of intestine: Secondary | ICD-10-CM | POA: Diagnosis not present

## 2019-02-09 DIAGNOSIS — I2510 Atherosclerotic heart disease of native coronary artery without angina pectoris: Secondary | ICD-10-CM | POA: Insufficient documentation

## 2019-02-09 DIAGNOSIS — Z880 Allergy status to penicillin: Secondary | ICD-10-CM | POA: Insufficient documentation

## 2019-02-09 DIAGNOSIS — G709 Myoneural disorder, unspecified: Secondary | ICD-10-CM | POA: Diagnosis not present

## 2019-02-09 DIAGNOSIS — I119 Hypertensive heart disease without heart failure: Secondary | ICD-10-CM | POA: Insufficient documentation

## 2019-02-09 DIAGNOSIS — Z923 Personal history of irradiation: Secondary | ICD-10-CM | POA: Insufficient documentation

## 2019-02-09 DIAGNOSIS — I252 Old myocardial infarction: Secondary | ICD-10-CM | POA: Insufficient documentation

## 2019-02-09 DIAGNOSIS — F419 Anxiety disorder, unspecified: Secondary | ICD-10-CM | POA: Diagnosis not present

## 2019-02-09 DIAGNOSIS — F17210 Nicotine dependence, cigarettes, uncomplicated: Secondary | ICD-10-CM | POA: Insufficient documentation

## 2019-02-09 DIAGNOSIS — K317 Polyp of stomach and duodenum: Secondary | ICD-10-CM | POA: Insufficient documentation

## 2019-02-09 DIAGNOSIS — Z885 Allergy status to narcotic agent status: Secondary | ICD-10-CM | POA: Insufficient documentation

## 2019-02-09 DIAGNOSIS — R1310 Dysphagia, unspecified: Secondary | ICD-10-CM | POA: Insufficient documentation

## 2019-02-09 DIAGNOSIS — K589 Irritable bowel syndrome without diarrhea: Secondary | ICD-10-CM | POA: Insufficient documentation

## 2019-02-09 DIAGNOSIS — K319 Disease of stomach and duodenum, unspecified: Secondary | ICD-10-CM | POA: Diagnosis not present

## 2019-02-09 DIAGNOSIS — K635 Polyp of colon: Secondary | ICD-10-CM | POA: Diagnosis not present

## 2019-02-09 DIAGNOSIS — K2950 Unspecified chronic gastritis without bleeding: Secondary | ICD-10-CM | POA: Diagnosis not present

## 2019-02-09 DIAGNOSIS — K219 Gastro-esophageal reflux disease without esophagitis: Secondary | ICD-10-CM | POA: Insufficient documentation

## 2019-02-09 DIAGNOSIS — D649 Anemia, unspecified: Secondary | ICD-10-CM | POA: Insufficient documentation

## 2019-02-09 DIAGNOSIS — Z86010 Personal history of colonic polyps: Secondary | ICD-10-CM | POA: Diagnosis not present

## 2019-02-09 DIAGNOSIS — E785 Hyperlipidemia, unspecified: Secondary | ICD-10-CM | POA: Insufficient documentation

## 2019-02-09 DIAGNOSIS — Z85828 Personal history of other malignant neoplasm of skin: Secondary | ICD-10-CM | POA: Diagnosis not present

## 2019-02-09 DIAGNOSIS — K259 Gastric ulcer, unspecified as acute or chronic, without hemorrhage or perforation: Secondary | ICD-10-CM | POA: Insufficient documentation

## 2019-02-09 DIAGNOSIS — K2970 Gastritis, unspecified, without bleeding: Secondary | ICD-10-CM | POA: Insufficient documentation

## 2019-02-09 DIAGNOSIS — Z538 Procedure and treatment not carried out for other reasons: Secondary | ICD-10-CM | POA: Diagnosis not present

## 2019-02-09 DIAGNOSIS — Z888 Allergy status to other drugs, medicaments and biological substances status: Secondary | ICD-10-CM | POA: Diagnosis not present

## 2019-02-09 DIAGNOSIS — K449 Diaphragmatic hernia without obstruction or gangrene: Secondary | ICD-10-CM | POA: Diagnosis not present

## 2019-02-09 DIAGNOSIS — E7800 Pure hypercholesterolemia, unspecified: Secondary | ICD-10-CM | POA: Diagnosis not present

## 2019-02-09 DIAGNOSIS — R194 Change in bowel habit: Secondary | ICD-10-CM | POA: Insufficient documentation

## 2019-02-09 DIAGNOSIS — I10 Essential (primary) hypertension: Secondary | ICD-10-CM | POA: Diagnosis not present

## 2019-02-09 DIAGNOSIS — K3189 Other diseases of stomach and duodenum: Secondary | ICD-10-CM | POA: Diagnosis not present

## 2019-02-09 DIAGNOSIS — Z79899 Other long term (current) drug therapy: Secondary | ICD-10-CM | POA: Insufficient documentation

## 2019-02-09 DIAGNOSIS — Z85841 Personal history of malignant neoplasm of brain: Secondary | ICD-10-CM | POA: Diagnosis not present

## 2019-02-09 HISTORY — PX: ESOPHAGOGASTRODUODENOSCOPY (EGD) WITH PROPOFOL: SHX5813

## 2019-02-09 HISTORY — PX: COLONOSCOPY WITH PROPOFOL: SHX5780

## 2019-02-09 SURGERY — ESOPHAGOGASTRODUODENOSCOPY (EGD) WITH PROPOFOL
Anesthesia: General

## 2019-02-09 MED ORDER — PROPOFOL 500 MG/50ML IV EMUL
INTRAVENOUS | Status: DC | PRN
  Administered 2019-02-09: 175 ug/kg/min via INTRAVENOUS

## 2019-02-09 MED ORDER — PROPOFOL 10 MG/ML IV BOLUS
INTRAVENOUS | Status: DC | PRN
  Administered 2019-02-09: 60 mg via INTRAVENOUS

## 2019-02-09 MED ORDER — ONDANSETRON HCL 4 MG/2ML IJ SOLN
INTRAMUSCULAR | Status: AC
  Administered 2019-02-09: 4 mg via INTRAVENOUS
  Filled 2019-02-09: qty 2

## 2019-02-09 MED ORDER — SODIUM CHLORIDE 0.9 % IV SOLN
INTRAVENOUS | Status: DC
  Administered 2019-02-09: 1000 mL via INTRAVENOUS

## 2019-02-09 MED ORDER — ONDANSETRON HCL 4 MG/2ML IJ SOLN
4.0000 mg | Freq: Once | INTRAMUSCULAR | Status: AC
  Administered 2019-02-09: 12:00:00 4 mg via INTRAVENOUS

## 2019-02-09 MED ORDER — ONDANSETRON HCL 4 MG/2ML IJ SOLN
INTRAMUSCULAR | Status: DC | PRN
  Administered 2019-02-09: 4 mg via INTRAVENOUS

## 2019-02-09 MED ORDER — LIDOCAINE HCL (CARDIAC) PF 100 MG/5ML IV SOSY
PREFILLED_SYRINGE | INTRAVENOUS | Status: DC | PRN
  Administered 2019-02-09: 50 mg via INTRAVENOUS

## 2019-02-09 NOTE — Anesthesia Postprocedure Evaluation (Signed)
Anesthesia Post Note  Patient: Susan Mcmillan  Procedure(s) Performed: ESOPHAGOGASTRODUODENOSCOPY (EGD) WITH PROPOFOL (N/A ) COLONOSCOPY WITH PROPOFOL (N/A )  Patient location during evaluation: Endoscopy Anesthesia Type: General Level of consciousness: awake and alert and oriented Pain management: pain level controlled Vital Signs Assessment: post-procedure vital signs reviewed and stable Respiratory status: spontaneous breathing, nonlabored ventilation and respiratory function stable Cardiovascular status: blood pressure returned to baseline and stable Postop Assessment: no signs of nausea or vomiting Anesthetic complications: no     Last Vitals:  Vitals:   02/09/19 1134 02/09/19 1410  BP: (!) 147/76 119/76  Pulse: 80   Resp: 18 18  Temp: (!) 35.8 C 37.1 C  SpO2: 99% 94%    Last Pain:  Vitals:   02/09/19 1420  TempSrc:   PainSc: 0-No pain                 Sayid Moll

## 2019-02-09 NOTE — Anesthesia Post-op Follow-up Note (Signed)
Anesthesia QCDR form completed.        

## 2019-02-09 NOTE — Transfer of Care (Signed)
Immediate Anesthesia Transfer of Care Note  Patient: BILINDA CUBBERLEY  Procedure(s) Performed: ESOPHAGOGASTRODUODENOSCOPY (EGD) WITH PROPOFOL (N/A ) COLONOSCOPY WITH PROPOFOL (N/A )  Patient Location: PACU  Anesthesia Type:General  Level of Consciousness: sedated  Airway & Oxygen Therapy: Patient Spontanous Breathing and Patient connected to nasal cannula oxygen  Post-op Assessment: Report given to RN and Post -op Vital signs reviewed and stable  Post vital signs: Reviewed and stable  Last Vitals:  Vitals Value Taken Time  BP    Temp    Pulse 76 02/09/19 1413  Resp 23 02/09/19 1413  SpO2 96 % 02/09/19 1413  Vitals shown include unvalidated device data.  Last Pain:  Vitals:   02/09/19 1410  TempSrc: Temporal  PainSc: Asleep         Complications: No apparent anesthesia complications

## 2019-02-09 NOTE — H&P (Signed)
Outpatient short stay form Pre-procedure 02/09/2019 12:35 PM Susan Sails MD  Primary Physician: Boykin Reaper, NP  Reason for visit: EGD and colonoscopy  History of present illness: Patient is a 73 year old female presenting today for an EGD and colonoscopy in regards to plan of dysphagia and personal history of adenomatous colon polyps.  When she was seen in the clinic she was placed on pantoprazole 40 mg a day however she has been taking it after meals.  I explained that she needs to take it at least 30 to 40 minutes before meal.  She had a barium swallow that indicated severe reflux however no evidence of other lesion or stenosis.  She tolerated her prep.  She takes no aspirin or blood thinning agent.  Patient states she has had a supraumbilical abdominal pain for over 40 years.    Current Facility-Administered Medications:  .  0.9 %  sodium chloride infusion, , Intravenous, Continuous, Susan Sails, MD, Last Rate: 20 mL/hr at 02/09/19 1151  Medications Prior to Admission  Medication Sig Dispense Refill Last Dose  . ALPRAZolam (XANAX) 0.25 MG tablet Take 2 tablets (0.5 mg total) by mouth at bedtime as needed. (Patient taking differently: Take 0.25 mg by mouth at bedtime as needed for sleep. ) 60 tablet 0 Past Month at Unknown time  . atorvastatin (LIPITOR) 80 MG tablet Take 1 tablet (80 mg total) by mouth daily. 30 tablet 0 Past Week at Unknown time  . Cholecalciferol (VITAMIN D3 PO) Take 1 tablet by mouth daily.   Past Month at Unknown time  . Cyanocobalamin (VITAMIN B-12 PO) Take 1 tablet by mouth daily.   Past Month at Unknown time  . diphenhydrAMINE (BENADRYL) 25 MG tablet Take 25 mg by mouth at bedtime.   Past Month at Unknown time  . FLUoxetine (PROZAC) 20 MG capsule TAKE 1 CAPSULE BY MOUTH EVERY DAY (Patient taking differently: Take 20 mg by mouth daily. ) 90 capsule 3 Past Week at Unknown time  . folic acid (FOLVITE) [id number] MCG tablet Take 800 mcg by mouth daily.   Past  Month at Unknown time  . metoprolol succinate (TOPROL-XL) 50 MG 24 hr tablet TAKE 1 TABLET BY MOUTH DAILY WITH OR IMMEDIATELY FOLLOWING MEAL (Patient taking differently: Take 25 mg by mouth daily. ) 90 tablet 3 Past Week at Unknown time  . Potassium 99 MG TABS Take 1 tablet by mouth daily. OTC   Past Month at Unknown time  . tetrahydrozoline 0.05 % ophthalmic solution Place 1 drop into both eyes 3 (three) times daily as needed (dry eyes).   Past Week at Unknown time  . triamterene-hydrochlorothiazide (MAXZIDE-25) 37.5-25 MG tablet TAKE 1/2 TAB BY MOUTH DAILY (Patient taking differently: Take 0.5 tablets by mouth daily. ) 90 tablet 1 Past Week at Unknown time     Allergies  Allergen Reactions  . Ace Inhibitors Other (See Comments)  . Codeine Nausea And Vomiting  . Demerol [Meperidine] Other (See Comments)    Heavy sedation  . Erythromycin Nausea Only    "All Mycins cause nausea"  . Iodinated Diagnostic Agents Nausea And Vomiting  . Other Other (See Comments)  . Penicillins Nausea Only    Can do amoxicillin Has patient had a PCN reaction causing immediate rash, facial/tongue/throat swelling, SOB or lightheadedness with hypotension: Yes Has patient had a PCN reaction causing severe rash involving mucus membranes or skin necrosis: No Has patient had a PCN reaction that required hospitalization No Has patient had a PCN reaction  occurring within the last 10 years: No If all of the above answers are "NO", then may proceed with Cephalosporin use.      Past Medical History:  Diagnosis Date  . Anxiety   . CAD (coronary artery disease)    a. 11/2015 MV: mod mid-distal ant ischemia, EF 58% w/ apical HK;  b. 11/2015 Cath: LM nl, LAD 80/146m (fills vial collats from RPDA & D1), D1 60ost, LCX small, nl, RCA 50p/m, RPDA nl, RPAV min irregs, RPl1/2/3 min irregs, EF 50-55%.  . Cancer of brain ([place]) 03/2017  . Dental bridge present    bilateral top.  Permanent retainer - bottom.  . Dysuria   .  History of kidney stones   . Hyperlipemia   . Hypertension   . Hypertensive heart disease   . IBS (irritable bowel syndrome)   . 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 Pernicious anemia   . Personal history of radiation therapy   . Personal history of tobacco use, presenting hazards to health 07/10/2015  . 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 Skin cancer   . Tobacco abuse    a. Still smoking ~ 1 cigarette/day.  Marland Kitchen Urethral caruncle   . Vertigo   . Vulvar adhesions 10/06/2013   perianal skin bridge noted at posterior fourchette and region of WLE surgical site  . Vulvar leukoplakia   . Vulvar pain     Review of systems:      Physical Exam    Heart and lungs: Regular rate and rhythm without rub or gallop lungs are bilaterally clear    HEENT: Normocephalic atraumatic eyes are anicteric    Other:    Pertinant exam for procedure: Soft nontender, generalized uncomfortable to palpation, no masses or rebound.  Nondistended bowel sounds positive normoactive    Planned proceedures: EGD, colonoscopy and indicated procedure. I have discussed the risks benefits and complications of procedures to include not limited to bleeding, infection, perforation and the risk of sedation and the patient wishes to proceed.    Susan Sails, MD Gastroenterology 02/09/2019  12:35 PM

## 2019-02-09 NOTE — Op Note (Signed)
[facility] Gastroenterology Patient Name: Susan Mcmillan Procedure Date: 02/09/2019 12:21 PM MRN: [id number] Account #: [id number] Date of Birth: 1946/04/02 Admit Type: Outpatient Age: 73 Room: [facility] ENDO ROOM 3 Gender: Female Note Status: Finalized Procedure:            Upper GI endoscopy Indications:          Epigastric abdominal pain, Dysphagia Providers:            Lollie Sails, MD Referring MD:         Precious Bard, MD (Referring MD) Medicines:            Monitored Anesthesia Care Complications:        No immediate complications. Procedure:            Pre-Anesthesia Assessment:                       - ASA Grade Assessment: III - A patient with severe                        systemic disease.                       After obtaining informed consent, the endoscope was                        passed under direct vision. Throughout the procedure,                        the patient's blood pressure, pulse, and oxygen                        saturations were monitored continuously. The Endoscope                        was introduced through the mouth, and advanced to the                        third part of duodenum. The patient tolerated the                        procedure well. Findings:      A small hiatal hernia was present.      The Z-line was regular. Biopsies were taken with a cold forceps for       histology.      The exam of the esophagus was otherwise normal.      Diffuse mild inflammation characterized by congestion (edema) and       erythema was found in the gastric body and in the gastric antrum.       Biopsies were taken with a cold forceps for histology from the antrum       and body and placed into separate containers..      A single 3 mm sessile polyp with no bleeding and no stigmata of recent       bleeding was found on the posterior wall of the gastric body. The polyp       was removed with a cold biopsy forceps. Resection and  retrieval were       complete.      A single 4 mm sessile polyp with no bleeding and no stigmata of recent  bleeding was found on the greater curvature of the gastric body. The       polyp was removed with a cold biopsy forceps. Resection and retrieval       were complete.      One non-bleeding superficial gastric ulcer with no stigmata of bleeding       was found on the greater curvature of the stomach closer to the distal       stomach than the previous polyp. The lesion was 2 mm in largest       dimension. Biopsies were taken with a cold forceps for histology.      The cardia and gastric fundus were normal on retroflexion otherwise.      The examined duodenum was normal. Impression:           - Small hiatal hernia.                       - Z-line regular. Biopsied.                       - Gastritis. Biopsied.                       - A single gastric polyp. Resected and retrieved.                       - A single gastric polyp. Resected and retrieved.                       - Non-bleeding gastric ulcer with no stigmata of                        bleeding. Biopsied.                       - Normal examined duodenum. Recommendation:       - Use Protonix (pantoprazole) 40 mg PO daily.                       - Await pathology results.                       - Perform a colonoscopy today. Procedure Code(s):    --- Professional ---                       [id number], Esophagogastroduodenoscopy, flexible, transoral;                        with biopsy, single or multiple Diagnosis Code(s):    --- Professional ---                       K44.9, Diaphragmatic hernia without obstruction or                        gangrene                       K29.70, Gastritis, unspecified, without bleeding                       K31.7, Polyp of stomach and duodenum  K25.9, Gastric ulcer, unspecified as acute or chronic,                        without hemorrhage or perforation                        R10.13, Epigastric pain                       R13.10, Dysphagia, unspecified CPT copyright 2019 American [employer]. All rights reserved. The codes documented in this report are preliminary and upon coder review may  be revised to meet current compliance requirements. Lollie Sails, MD 02/09/2019 1:34:43 PM This report has been signed electronically. Number of Addenda: 0 Note Initiated On: 02/09/2019 12:21 PM      [facility]

## 2019-02-09 NOTE — Anesthesia Procedure Notes (Signed)
Date/Time: 02/09/2019 1:07 PM Performed by: Johnna Acosta, CRNA Pre-anesthesia Checklist: Patient identified, Emergency Drugs available, Suction available, Patient being monitored and Timeout performed Patient Re-evaluated:Patient Re-evaluated prior to induction Oxygen Delivery Method: Nasal cannula Preoxygenation: Pre-oxygenation with 100% oxygen Induction Type: IV induction

## 2019-02-09 NOTE — Anesthesia Preprocedure Evaluation (Signed)
Anesthesia Evaluation  Patient identified by MRN, date of birth, ID band Patient awake    Reviewed: Allergy & Precautions, NPO status , Patient's Chart, lab work & pertinent test results, reviewed documented beta blocker date and time   Airway Mallampati: II  TM Distance: >3 FB     Dental  (+) Chipped   Pulmonary Current Smoker and Patient abstained from smoking.,           Cardiovascular hypertension, Pt. on home beta blockers + angina + CAD and + Past MI       Neuro/Psych PSYCHIATRIC DISORDERS Anxiety  Neuromuscular disease    GI/Hepatic   Endo/Other    Renal/GU      Musculoskeletal   Abdominal   Peds  Hematology  (+) anemia ,   Anesthesia Other Findings   Reproductive/Obstetrics                             Anesthesia Physical Anesthesia Plan  ASA: III  Anesthesia Plan: General   Post-op Pain Management:    Induction: Intravenous  PONV Risk Score and Plan:   Airway Management Planned:   Additional Equipment:   Intra-op Plan:   Post-operative Plan:   Informed Consent: I have reviewed the patients History and Physical, chart, labs and discussed the procedure including the risks, benefits and alternatives for the proposed anesthesia with the patient or authorized representative who has indicated his/her understanding and acceptance.       Plan Discussed with: CRNA  Anesthesia Plan Comments:         Anesthesia Quick Evaluation

## 2019-02-09 NOTE — Op Note (Signed)
[facility] Gastroenterology Patient Name: Susan Mcmillan Procedure Date: 02/09/2019 12:21 PM MRN: [id number] Account #: [id number] Date of Birth: 24-Oct-1945 Admit Type: Outpatient Age: 73 Room: [facility] ENDO ROOM 3 Gender: Female Note Status: Finalized Procedure:            Colonoscopy Indications:          Personal history of colonic polyps, Change in bowel                        habits Providers:            Lollie Sails, MD Referring MD:         Precious Bard, MD (Referring MD) Medicines:            Monitored Anesthesia Care Complications:        No immediate complications. Procedure:            Pre-Anesthesia Assessment:                       - ASA Grade Assessment: III - A patient with severe                        systemic disease.                       After obtaining informed consent, the colonoscope was                        passed under direct vision. Throughout the procedure,                        the patient's blood pressure, pulse, and oxygen                        saturations were monitored continuously. The                        Colonoscope was introduced through the anus with the                        intention of advancing to the cecum. The scope was                        advanced to the sigmoid colon before the procedure was                        aborted. Medications were given. The colonoscopy was                        extremely difficult due to restricted mobility of the                        colon. Findings:      The recto-sigmoid colon was significantly tortuous. The scope was       advanced to about 33 cm where a very sharp angulation was encountered.       Despite position change, and change of scope I was unable to pass       further. The examined colon appeared normal throughout otherwise.      Biopsies for histology were taken with a cold forceps from the  sigmoid       colon for evaluation of microscopic colitis.   The retroflexed view of the distal rectum and anal verge was normal and       showed no anal or rectal abnormalities.      The digital rectal exam was normal. Impression:           - Tortuous colon.                       - The distal rectum and anal verge are normal on                        retroflexion view.                       - Biopsies were taken with a cold forceps from the                        sigmoid colon for evaluation of microscopic colitis. Recommendation:       - Discharge patient to home.                       - Perform a virtual colonoscopy at appointment to be                        scheduled. Procedure Code(s):    --- Professional ---                       [phone], 71, Colonoscopy, flexible; with biopsy, single                        or multiple Diagnosis Code(s):    --- Professional ---                       Z86.010, Personal history of colonic polyps                       R19.4, Change in bowel habit                       Q43.8, Other specified congenital malformations of                        intestine CPT copyright 2019 American Medical Association. All rights reserved. The codes documented in this report are preliminary and upon coder review may  be revised to meet current compliance requirements. Lollie Sails, MD 02/09/2019 2:10:06 PM This report has been signed electronically. Number of Addenda: 0 Note Initiated On: 02/09/2019 12:21 PM Total Procedure Duration: 0 hours 24 minutes 24 seconds       [facility]

## 2019-02-10 ENCOUNTER — Encounter: Payer: Self-pay | Admitting: Gastroenterology

## 2019-02-12 LAB — SURGICAL PATHOLOGY

## 2019-02-12 NOTE — Progress Notes (Signed)
Cardiology Office Note    Date:  02/15/2019   ID:  Susan, Mcmillan Susan 27, Mcmillan, MRN [id number]  PCP:  Marinda Elk, MD  Cardiologist:  Kathlyn Sacramento, MD  Electrophysiologist:  None   Chief Complaint: Follow up  History of Present Illness:   Susan Mcmillan is a 73 y.o. 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  In 07/2015, she underwent CT of the chest for follow-up of a pulmonary nodule that incidentally showed multivessel CAD.  She was also noted to have anterior T wave inversion on EKG at that time.  In this setting, she underwent stress testing which showed moderate mid to distal anterior wall ischemia with normal LV systolic function and apical hypokinesis.  Diagnostic cardiac cath in 11/2015 revealed CTO of the mid LAD with left to right collaterals and left to left collaterals with otherwise moderate RCA and branch vessel disease as outlined below.  LV gram showed an EF of 50 to 55% with mild anterior wall hypokinesis.  She was medically managed.  She was most recently seen virtually in 07/2018 and was doing well from a cardiac perspective.  She comes in doing reasonably well from a cardiac perspective.  She does report some positional dizziness if she stands too quickly or stands from leaning over which typically lasts for a few seconds and resolves.  She has not had any falls with this nor suffered any presyncopal or syncopal episodes.  She does feel like she stays adequately hydrated.  In the setting of her persistent no dizziness her Maxide and Toprol-XL or recently cut in half by her PCP.  Patient reports her blood pressure typically runs in the [id number] to 0000000 systolic.  She also notes some intermittent left upper chest wall pain that is not associated with exertion or rest and  occurs randomly.  This is not associated with her dizziness.  No associated shortness of breath, diaphoresis, nausea, or vomiting.  Currently symptom-free.   Labs: 11/2018 - Hgb 14.2, PLT 335, potassium 3.5, serum creatinine 0.9, AST/ALT normal, albumin 4.0 01/2018 - total cholesterol 160, triglyceride 172, HDL 53, LDL 72   Past Medical History:  Diagnosis Date  . Anxiety   . CAD (coronary artery disease)    a. 11/2015 MV: mod mid-distal ant ischemia, EF 58% w/ apical HK;  b. 11/2015 Cath: LM nl, LAD 80/123m (fills vial collats from RPDA & D1), D1 60ost, LCX small, nl, RCA 50p/m, RPDA nl, RPAV min irregs, RPl1/2/3 min irregs, EF 50-55%.  . Cancer of brain ([place]) 03/2017  . Dental bridge present    bilateral top.  Permanent retainer - bottom.  . Dysuria   . History of kidney stones   . Hyperlipemia   . Hypertension   . Hypertensive heart disease   . IBS (irritable bowel syndrome)   . 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 Pernicious anemia   . Personal history of radiation therapy   . Personal history of tobacco use, presenting hazards to health 07/10/2015  . 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 Skin cancer   . Tobacco abuse    a. Still smoking ~ 1 cigarette/day.  Marland Kitchen Urethral caruncle   . Vertigo   . Vulvar adhesions 10/06/2013   perianal  skin bridge noted at posterior fourchette and region of WLE surgical site  . Vulvar leukoplakia   . Vulvar pain     Past Surgical History:  Procedure Laterality Date  . ABDOMINAL HERNIA REPAIR  2014   Dr. Rochel Brome  . ABDOMINAL HYSTERECTOMY  1978  . APPENDECTOMY    . BRAIN SURGERY    . CARDIAC CATHETERIZATION Left 12/07/2015   Procedure: Left Heart Cath and Coronary Angiography;  Surgeon: Wellington Hampshire, MD;  Location: [facility];  Service: Cardiovascular;  Laterality: Left;  . CATARACT EXTRACTION W/PHACO Right 05/28/2018   Procedure: CATARACT EXTRACTION PHACO AND INTRAOCULAR LENS PLACEMENT ([place]) RIGHT;   Surgeon: Marchia Meiers, MD;  Location: [facility];  Service: Ophthalmology;  Laterality: Right;  [place] 00:56 CDE 7.17 Fluid pack Lot # [id number]  . CATARACT EXTRACTION W/PHACO Left 01/06/2019   Procedure: CATARACT EXTRACTION PHACO AND INTRAOCULAR LENS PLACEMENT ([facility]) LEFT  00:50.3  19.3%  9.71;  Surgeon: Leandrew Koyanagi, MD;  Location: [facility];  Service: Ophthalmology;  Laterality: Left;  . CERVICAL BIOPSY     Dr. Enzo Bi  . CHOLECYSTECTOMY    . COLONOSCOPY WITH PROPOFOL N/A 02/09/2019   Procedure: COLONOSCOPY WITH PROPOFOL;  Surgeon: Lollie Sails, MD;  Location[facility];  Service: Endoscopy;  Laterality: N/A;  . ESOPHAGOGASTRODUODENOSCOPY (EGD) WITH PROPOFOL N/A 02/09/2019   Procedure: ESOPHAGOGASTRODUODENOSCOPY (EGD) WITH PROPOFOL;  Surgeon: Lollie Sails, MD;  Location: [facility];  Service: Endoscopy;  Laterality: N/A;  . wide local excision  07/2013   vin 3 w/ margins involoved    Current Medications: Current Meds  Medication Sig  . ALPRAZolam (XANAX) 0.25 MG tablet Take 2 tablets (0.5 mg total) by mouth at bedtime as needed. (Patient taking differently: Take 0.25 mg by mouth at bedtime as needed for sleep. )  . atorvastatin (LIPITOR) 80 MG tablet Take 1 tablet (80 mg total) by mouth daily.  . Cholecalciferol (VITAMIN D3 PO) Take 1 tablet by mouth daily.  . Cyanocobalamin (VITAMIN B-12 PO) Take 1 tablet by mouth daily.  . diphenhydrAMINE (BENADRYL) 25 MG tablet Take 25 mg by mouth at bedtime.  Marland Kitchen FLUoxetine (PROZAC) 20 MG capsule TAKE 1 CAPSULE BY MOUTH EVERY DAY (Patient taking differently: Take 20 mg by mouth daily. )  . folic acid (FOLVITE) [id number] MCG tablet Take 800 mcg by mouth daily.  . metoprolol succinate (TOPROL-XL) 50 MG 24 hr tablet TAKE 1 TABLET BY MOUTH DAILY WITH OR IMMEDIATELY FOLLOWING MEAL (Patient taking differently: Take 25 mg by mouth daily. )  . pantoprazole (PROTONIX) 40 MG tablet Take 40 mg by mouth daily.  . Potassium 99 MG TABS  Take 1 tablet by mouth AS NEEDED  . tetrahydrozoline 0.05 % ophthalmic solution Place 1 drop into both eyes 3 (three) times daily as needed (dry eyes).  . triamterene-hydrochlorothiazide (MAXZIDE-25) 37.5-25 MG tablet TAKE 1/2 TAB BY MOUTH DAILY (Patient taking differently: Take 0.5 tablets by mouth daily. )    Allergies:   Ace inhibitors, Codeine, Demerol [meperidine], Erythromycin, Iodinated diagnostic agents, Other, and Penicillins   Social History   Socioeconomic History  . Marital status: Married    Spouse name: Not on file  . Number of children: Not on file  . Years of education: Not on file  . Highest education level: Not on file  Occupational History  . Occupation: retired  Scientific laboratory technician  . Financial resource strain: Not on file  . Food insecurity    Worry: Not on file  Inability: Not on file  . Transportation needs    Medical: Not on file    Non-medical: Not on file  Tobacco Use  . Smoking status: Current Some Day Smoker    Packs/day: 0.25    Years: 55.00    Pack years: 13.75    Types: Cigarettes    Last attempt to quit: 12/07/2015    Years since quitting: 3.1  . Smokeless tobacco: Never Used  . Tobacco comment: smokes about 2 cigarettes a day  Substance and Sexual Activity  . Alcohol use: Yes    Alcohol/week: 0.0 standard drinks    Comment: 8 oz Budweiser every afternoon   . Drug use: No  . Sexual activity: Not Currently  Lifestyle  . Physical activity    Days per week: Not on file    Minutes per session: Not on file  . Stress: Not on file  Relationships  . Social Herbalist on phone: Not on file    Gets together: Not on file    Attends religious service: Not on file    Active member of club or organization: Not on file    Attends meetings of clubs or organizations: Not on file    Relationship status: Not on file  Other Topics Concern  . Not on file  Social History Narrative   Lives in [place]. Previously worked [employer]. Husband with  dementia. Children 2 sons.     Family History:  The patient's family history includes Bone cancer in her father; Cancer in her father; Congestive Heart Failure in her mother; Dementia in her sister; Hyperlipidemia in her sister; Hypertension in her sister; Macular degeneration in her sister. There is no history of Diabetes or Heart disease.  ROS:   Review of Systems  Constitutional: Positive for malaise/fatigue. Negative for chills, diaphoresis, fever and weight loss.  HENT: Negative for congestion.   Eyes: Negative for discharge and redness.  Respiratory: Negative for cough, hemoptysis, sputum production, shortness of breath and wheezing.   Cardiovascular: Positive for chest pain. Negative for palpitations, orthopnea, claudication, leg swelling and PND.  Gastrointestinal: Negative for abdominal pain, blood in stool, heartburn, melena, nausea and vomiting.  Genitourinary: Negative for hematuria.  Musculoskeletal: Negative for falls and myalgias.  Skin: Negative for rash.  Neurological: Positive for dizziness. Negative for tingling, tremors, sensory change, speech change, focal weakness, loss of consciousness and weakness.  Endo/Heme/Allergies: Does not bruise/bleed easily.  Psychiatric/Behavioral: Negative for substance abuse. The patient is not nervous/anxious.   All other systems reviewed and are negative.    EKGs/Labs/Other Studies Reviewed:    Studies reviewed were summarized above. The additional studies were reviewed today:   LHC 11/2015: The left ventricular systolic function is normal.  LV end diastolic pressure is normal.  The left ventricular ejection fraction is 50-55% by visual estimate.  There is no mitral valve regurgitation.  There is no aortic valve stenosis.  Prox RCA to Mid RCA lesion, 50 %stenosed.  Mid LAD lesion, 100 %stenosed.  Ost 1st Diag to 1st Diag lesion, 60 %stenosed.  Prox LAD to Mid LAD lesion, 80 %stenosed.   1.severe one-vessel coronary  artery disease with chronically occluded mid LAD with faint right to left collaterals and left to left collaterals from the diagonal branch. Otherwise there is moderate disease affecting the mid RCA and ostial first diagonal which is large in size. 2. Low normal LV systolic function with an ejection fraction of 50-55% with mild anterior wall hypokinesis.moderately elevated filling left  ventricular end-diastolic pressure but blood pressure was also moderately elevated at that time.  Recommendations: Continue aggressive medical therapy. Recommend a target LDL of less than 70. The LAD itself appears to be diffusely diseased in the proximal and midsegment. I doubt that she would get much benefit from LAD CTO PCI.   EKG:  EKG is ordered today.  The EKG ordered today demonstrates NSR, 64 bpm, anterolateral T wave inversion unchanged from prior  Recent Labs: 12/02/2018: ALT 17; BUN 15; Creatinine, Ser 0.90; Hemoglobin 14.2; Platelets 335; Potassium 3.5; Sodium 139  Recent Lipid Panel    Component Value Date/Time   CHOL 149 02/06/2016 0953   TRIG 155 (H) 02/06/2016 0953   HDL 54 02/06/2016 0953   CHOLHDL 2.8 02/06/2016 0953   CHOLHDL 4 05/17/2015 1442   VLDL 57.6 (H) 05/17/2015 1442   LDLCALC 64 02/06/2016 0953   LDLDIRECT 88.0 05/17/2015 1442    PHYSICAL EXAM:    VS:  BP 130/78 (BP Location: Left Arm, Patient Position: Sitting, Cuff Size: Normal)   Pulse 64   Ht 5\' 2"  (1.575 m)   Wt 158 lb 8 oz (71.9 kg)   BMI 28.99 kg/m   BMI: Body mass index is 28.99 kg/m.  Physical Exam  Wt Readings from Last 3 Encounters:  02/15/19 158 lb 8 oz (71.9 kg)  02/09/19 155 lb (70.3 kg)  01/06/19 159 lb (72.1 kg)     ASSESSMENT & PLAN:   1. CAD involving the native coronary arteries chest pain with moderate risk for cardiac etiology: Currently symptom-free.  Most recent ischemic evaluation from 2017 as detailed above.  Patient declines ischemic evaluation at this time.  Continue current medical  therapy.  2. HTN: Blood pressure is reasonably controlled today in the setting of positional dizziness.  Continue current therapy.  3. HLD: LDL of 72 from 01/2018 with goal LDL being less than 70.  Followed by PCP.  Has lab scheduled for later this month.  4. Positional dizziness: Recently had tapering of Toprol-XL and Maxide.  Continue adequate hydration.  Recommend slow positional changes.  Uncertain if this is related to her prior brain cancer.  Consider addition of cane for stability.  5. Tobacco use: Cessation was again recommended.  Patient is not interested in quitting.   Disposition: F/u with Dr. Fletcher Anon or an APP in 6 months.   Medication Adjustments/Labs and Tests Ordered: Current medicines are reviewed at length with the patient today.  Concerns regarding medicines are outlined above. Medication changes, Labs and Tests ordered today are summarized above and listed in the Patient Instructions accessible in Encounters.   Signed, Christell Faith, PA-C 02/15/2019 1:56 PM     [address], [place] [id number] [phone]

## 2019-02-15 ENCOUNTER — Other Ambulatory Visit: Payer: Self-pay

## 2019-02-15 ENCOUNTER — Ambulatory Visit: Payer: PPO | Admitting: Physician Assistant

## 2019-02-15 ENCOUNTER — Encounter: Payer: Self-pay | Admitting: Physician Assistant

## 2019-02-15 VITALS — BP 130/78 | HR 64 | Ht 62.0 in | Wt 158.5 lb

## 2019-02-15 DIAGNOSIS — R42 Dizziness and giddiness: Secondary | ICD-10-CM

## 2019-02-15 DIAGNOSIS — E785 Hyperlipidemia, unspecified: Secondary | ICD-10-CM | POA: Diagnosis not present

## 2019-02-15 DIAGNOSIS — I10 Essential (primary) hypertension: Secondary | ICD-10-CM | POA: Diagnosis not present

## 2019-02-15 DIAGNOSIS — I25118 Atherosclerotic heart disease of native coronary artery with other forms of angina pectoris: Secondary | ICD-10-CM | POA: Diagnosis not present

## 2019-02-15 DIAGNOSIS — Z720 Tobacco use: Secondary | ICD-10-CM

## 2019-02-15 MED ORDER — METOPROLOL SUCCINATE ER 25 MG PO TB24: 25.0000 mg | ORAL_TABLET | Freq: Every day | ORAL | 2 refills | Status: DC

## 2019-02-15 NOTE — Patient Instructions (Addendum)
Medication Instructions:  Your physician has recommended you make the following change in your medication:  1- CHANGE Toprol to 25 mg by mouth once a day.   If you need a refill on your cardiac medications before your next appointment, please call your pharmacy.   Lab work: NONE If you have labs (blood work) drawn today and your tests are completely normal, you will receive your results only by: Marland Kitchen MyChart Message (if you have MyChart) OR . A paper copy in the mail If you have any lab test that is abnormal or we need to change your treatment, we will call you to review the results.  Testing/Procedures: Your physician has requested that you have a carotid duplex. This test is an ultrasound of the carotid arteries in your neck. It looks at blood flow through these arteries that supply the brain with blood. Allow one hour for this exam. There are no restrictions or special instructions.    Follow-Up: At [facility], you and your health needs are our priority.  As part of our continuing mission to provide you with exceptional heart care, we have created designated Provider Care Teams.  These Care Teams include your primary Cardiologist (physician) and Advanced Practice Providers (APPs -  Physician Assistants and Nurse Practitioners) who all work together to provide you with the care you need, when you need it. You will need a follow up appointment in 6 months.  Please call our office 2 months in advance to schedule this appointment.  You may see Kathlyn Sacramento, MD or one of the following Advanced Practice Providers on your designated Care Team:   Murray Hodgkins, NP Christell Faith, PA-C . Marrianne Mood, PA-C

## 2019-02-17 ENCOUNTER — Other Ambulatory Visit: Payer: Self-pay | Admitting: Gastroenterology

## 2019-02-17 DIAGNOSIS — Z1211 Encounter for screening for malignant neoplasm of colon: Secondary | ICD-10-CM

## 2019-02-18 DIAGNOSIS — M5136 Other intervertebral disc degeneration, lumbar region: Secondary | ICD-10-CM | POA: Diagnosis not present

## 2019-02-18 DIAGNOSIS — M5416 Radiculopathy, lumbar region: Secondary | ICD-10-CM | POA: Diagnosis not present

## 2019-02-22 ENCOUNTER — Other Ambulatory Visit: Payer: Self-pay

## 2019-02-22 MED ORDER — ATORVASTATIN CALCIUM 80 MG PO TABS: 80.0000 mg | ORAL_TABLET | Freq: Every day | ORAL | 0 refills | Status: DC

## 2019-02-23 ENCOUNTER — Emergency Department: Payer: PPO

## 2019-02-23 ENCOUNTER — Other Ambulatory Visit: Payer: Self-pay

## 2019-02-23 ENCOUNTER — Emergency Department
Admission: EM | Admit: 2019-02-23 | Discharge: 2019-02-23 | Disposition: A | Discharge status: Home / Self Care | Payer: PPO | Attending: Student | Admitting: Student

## 2019-02-23 ENCOUNTER — Encounter: Payer: Self-pay | Admitting: *Deleted

## 2019-02-23 DIAGNOSIS — R531 Weakness: Secondary | ICD-10-CM | POA: Diagnosis not present

## 2019-02-23 DIAGNOSIS — R42 Dizziness and giddiness: Secondary | ICD-10-CM | POA: Diagnosis not present

## 2019-02-23 DIAGNOSIS — R197 Diarrhea, unspecified: Secondary | ICD-10-CM | POA: Insufficient documentation

## 2019-02-23 DIAGNOSIS — Z79899 Other long term (current) drug therapy: Secondary | ICD-10-CM | POA: Diagnosis not present

## 2019-02-23 DIAGNOSIS — R112 Nausea with vomiting, unspecified: Secondary | ICD-10-CM

## 2019-02-23 DIAGNOSIS — Z20828 Contact with and (suspected) exposure to other viral communicable diseases: Secondary | ICD-10-CM | POA: Insufficient documentation

## 2019-02-23 DIAGNOSIS — I2510 Atherosclerotic heart disease of native coronary artery without angina pectoris: Secondary | ICD-10-CM | POA: Diagnosis not present

## 2019-02-23 DIAGNOSIS — Z87891 Personal history of nicotine dependence: Secondary | ICD-10-CM | POA: Diagnosis not present

## 2019-02-23 DIAGNOSIS — I252 Old myocardial infarction: Secondary | ICD-10-CM | POA: Diagnosis not present

## 2019-02-23 DIAGNOSIS — I10 Essential (primary) hypertension: Secondary | ICD-10-CM | POA: Insufficient documentation

## 2019-02-23 LAB — COMPREHENSIVE METABOLIC PANEL
ALT: 14 U/L (ref 0–44)
AST: 17 U/L (ref 15–41)
Albumin: 3.9 g/dL (ref 3.5–5.0)
Alkaline Phosphatase: 83 U/L (ref 38–126)
Anion gap: 13 (ref 5–15)
BUN: 10 mg/dL (ref 8–23)
CO2: 26 mmol/L (ref 22–32)
Calcium: 9.3 mg/dL (ref 8.9–10.3)
Chloride: 98 mmol/L (ref 98–111)
Creatinine, Ser: 0.72 mg/dL (ref 0.44–1.00)
GFR calc Af Amer: >60 mL/min (ref >60)
GFR calc non Af Amer: >60 mL/min (ref >60)
Glucose, Bld: 103 mg/dL — ABNORMAL HIGH (ref 70–99)
Potassium: 2.9 mmol/L — ABNORMAL LOW (ref 3.5–5.1)
Sodium: 137 mmol/L (ref 135–145)
Total Bilirubin: 0.7 mg/dL (ref 0.3–1.2)
Total Protein: 7.6 g/dL (ref 6.5–8.1)

## 2019-02-23 LAB — URINALYSIS, COMPLETE (UACMP) WITH MICROSCOPIC
Bilirubin Urine: NEGATIVE
Glucose, UA: NEGATIVE mg/dL
Hgb urine dipstick: NEGATIVE
Ketones, ur: NEGATIVE mg/dL
Leukocytes,Ua: NEGATIVE
Nitrite: NEGATIVE
Protein, ur: NEGATIVE mg/dL
Specific Gravity, Urine: 1.008 (ref 1.005–1.030)
pH: 7 (ref 5.0–8.0)

## 2019-02-23 LAB — INFLUENZA PANEL BY PCR (TYPE A & B)
Influenza A By PCR: NEGATIVE
Influenza B By PCR: NEGATIVE

## 2019-02-23 LAB — CBC
HCT: 40.5 % (ref 36.0–46.0)
Hemoglobin: 13.9 g/dL (ref 12.0–15.0)
MCH: 30.9 pg (ref 26.0–34.0)
MCHC: 34.3 g/dL (ref 30.0–36.0)
MCV: 90 fL (ref 80.0–100.0)
Platelets: 342 10*3/uL (ref 150–400)
RBC: 4.5 MIL/uL (ref 3.87–5.11)
RDW: 13.2 % (ref 11.5–15.5)
WBC: 8.2 10*3/uL (ref 4.0–10.5)
nRBC: 0 % (ref 0.0–0.2)

## 2019-02-23 LAB — LIPASE, BLOOD: Lipase: 23 U/L (ref 11–51)

## 2019-02-23 LAB — TYPE AND SCREEN
ABO/RH(D): O POS
Antibody Screen: NEGATIVE

## 2019-02-23 MED ORDER — PANTOPRAZOLE SODIUM 40 MG IV SOLR
40.0000 mg | Freq: Once | INTRAVENOUS | Status: AC
  Administered 2019-02-23: 40 mg via INTRAVENOUS
  Filled 2019-02-23: qty 40

## 2019-02-23 MED ORDER — SODIUM CHLORIDE 0.9 % IV BOLUS
1000.0000 mL | Freq: Once | INTRAVENOUS | Status: AC
  Administered 2019-02-23: 1000 mL via INTRAVENOUS

## 2019-02-23 MED ORDER — ONDANSETRON HCL 4 MG PO TABS: 4.0000 mg | ORAL_TABLET | Freq: Three times a day (TID) | ORAL | 0 refills | Status: AC | PRN

## 2019-02-23 MED ORDER — SODIUM CHLORIDE 0.9% FLUSH: 3.0000 mL | Freq: Once | INTRAVENOUS | Status: DC

## 2019-02-23 MED ORDER — ONDANSETRON HCL 4 MG/2ML IJ SOLN
4.0000 mg | Freq: Once | INTRAMUSCULAR | Status: AC
  Administered 2019-02-23: 20:00:00 4 mg via INTRAVENOUS
  Filled 2019-02-23: qty 2

## 2019-02-23 MED ORDER — PROMETHAZINE HCL 25 MG/ML IJ SOLN: 12.5000 mg | Freq: Once | INTRAMUSCULAR | Status: DC

## 2019-02-23 MED ORDER — POTASSIUM CHLORIDE CRYS ER 20 MEQ PO TBCR
40.0000 meq | EXTENDED_RELEASE_TABLET | Freq: Once | ORAL | Status: AC
  Administered 2019-02-23: 40 meq via ORAL
  Filled 2019-02-23: qty 2

## 2019-02-23 NOTE — ED Provider Notes (Signed)
[facility] Emergency Department Provider Note  ____________________________________________   First MD Initiated Contact with Patient 02/23/19 1919     (approximate)  I have reviewed the triage vital signs and the nursing notes.  History  Chief Complaint Emesis    HPI Susan Mcmillan is a 73 y.o. female with history of CAD, recent EGD and colonoscopy on 9/29 (with multiple biopsies taken on EGD, unable to complete full colonoscopy) who presents for vomiting and diarrhea.  Patient states for about a week after her procedure she was doing well.  This weekend (10/10) she started to develop vomiting and diarrhea.  She has had several episodes of yellowish, nonbloody emesis. She states the only PO she seems able to tolerate is Gatorade.  She also reports several episodes of loose stool, which she describes as yellow/orange in color with some black component as well.  She is not on any blood thinning medication.  No recent antibiotics.  She states she feels overall "crummy" and fatigued. No fevers, chest pain, shortness of breath, abdominal pain, dysuria, hematuria. No sick contacts.   Past Medical Hx Past Medical History:  Diagnosis Date  . Anxiety   . CAD (coronary artery disease)    a. 11/2015 MV: mod mid-distal ant ischemia, EF 58% w/ apical HK;  b. 11/2015 Cath: LM nl, LAD 80/178m (fills vial collats from RPDA & D1), D1 60ost, LCX small, nl, RCA 50p/m, RPDA nl, RPAV min irregs, RPl1/2/3 min irregs, EF 50-55%.  . Cancer of brain ([place]) 03/2017  . Dental bridge present    bilateral top.  Permanent retainer - bottom.  . Dysuria   . History of kidney stones   . Hyperlipemia   . Hypertension   . Hypertensive heart disease   . IBS (irritable bowel syndrome)   . 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 Pernicious anemia   . Personal history of radiation therapy   . Personal history of tobacco use, presenting hazards to health 07/10/2015  . 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 Skin cancer   . Tobacco abuse    a. Still smoking ~ 1 cigarette/day.  Marland Kitchen Urethral caruncle   . Vertigo   . Vulvar adhesions 10/06/2013   perianal skin bridge noted at posterior fourchette and region of WLE surgical site  . Vulvar leukoplakia   . Vulvar pain     Problem List Patient Active Problem List   Diagnosis Date Noted  . CAD (coronary artery disease)   . Hypercholesteremia   . Hyperlipidemia LDL goal <70   . Hypertensive heart disease   . IBS (irritable bowel syndrome)   . Coronary artery disease involving native coronary artery of native heart with angina pectoris ([place])   . Coronary artery disease due to lipid rich plaque 11/20/2015  . Personal history of tobacco use, presenting hazards to health 07/10/2015  . Routine general medical examination at a health care facility 05/17/2015  . Constipation 02/07/2015  . Cervical disc disorder with radiculopathy of cervical region 02/07/2015  . 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 Adjustment disorder with mixed anxiety and depressed mood 11/25/2014  . 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 Numbness and tingling of right arm and leg 11/25/2014  . Irritable bowel syndrome 11/18/2013  . Overweight (BMI 25.0-29.9) 11/18/2013  . Urethral caruncle 05/20/2013  . Hot flashes 02/11/2013  . Abnormal mammogram 08/07/2012  .  Nonspecific abnormal electrocardiogram (ECG) (EKG) 05/07/2012  . Medicare annual wellness visit, subsequent 05/07/2012  . Tobacco abuse 05/07/2012  . Hypertension 01/06/2012  . Hyperlipidemia 01/06/2012    Past Surgical Hx Past Surgical History:  Procedure Laterality Date  . ABDOMINAL HERNIA REPAIR  2014   Dr. Rochel Brome  . ABDOMINAL HYSTERECTOMY  1978  . APPENDECTOMY    . BRAIN SURGERY    . CARDIAC CATHETERIZATION Left 12/07/2015   Procedure: Left Heart Cath and Coronary Angiography;  Surgeon: Wellington Hampshire,  MD;  Location: [facility];  Service: Cardiovascular;  Laterality: Left;  . CATARACT EXTRACTION W/PHACO Right 05/28/2018   Procedure: CATARACT EXTRACTION PHACO AND INTRAOCULAR LENS PLACEMENT ([place]) RIGHT;  Surgeon: Marchia Meiers, MD;  Location: [facility];  Service: Ophthalmology;  Laterality: Right;  [place] 00:56 CDE 7.17 Fluid pack Lot # [id number]  . CATARACT EXTRACTION W/PHACO Left 01/06/2019   Procedure: CATARACT EXTRACTION PHACO AND INTRAOCULAR LENS PLACEMENT (I[facility]) LEFT  00:50.3  19.3%  9.71;  Surgeon: Leandrew Koyanagi, MD;  Location: [facility];  Service: Ophthalmology;  Laterality: Left;  . CERVICAL BIOPSY     Dr. Enzo Bi  . CHOLECYSTECTOMY    . COLONOSCOPY WITH PROPOFOL N/A 02/09/2019   Procedure: COLONOSCOPY WITH PROPOFOL;  Surgeon: Lollie Sails, MD;  Location: [facility];  Service: Endoscopy;  Laterality: N/A;  . ESOPHAGOGASTRODUODENOSCOPY (EGD) WITH PROPOFOL N/A 02/09/2019   Procedure: ESOPHAGOGASTRODUODENOSCOPY (EGD) WITH PROPOFOL;  Surgeon: Lollie Sails, MD;  Location: [facility]COPY;  Service: Endoscopy;  Laterality: N/A;  . wide local excision  07/2013   vin 3 w/ margins involoved    Medications Prior to Admission medications   Medication Sig Start Date End Date Taking? Authorizing Provider  ALPRAZolam Duanne Moron) 0.25 MG tablet Take 2 tablets (0.5 mg total) by mouth at bedtime as needed. Patient taking differently: Take 0.25 mg by mouth at bedtime as needed for sleep.  11/25/14   Jackolyn Confer, MD  atorvastatin (LIPITOR) 80 MG tablet Take 1 tablet (80 mg total) by mouth daily. 02/22/19   Theora Gianotti, NP  Cholecalciferol (VITAMIN D3 PO) Take 1 tablet by mouth daily.    [provider]  Cyanocobalamin (VITAMIN B-12 PO) Take 1 tablet by mouth daily.    [provider]  diphenhydrAMINE (BENADRYL) 25 MG tablet Take 25 mg by mouth at bedtime.    [provider]  FLUoxetine (PROZAC) 20 MG capsule TAKE 1 CAPSULE  BY MOUTH EVERY DAY Patient taking differently: Take 20 mg by mouth daily.  01/25/16   Coral Spikes, DO  folic acid (FOLVITE) Q000111Q MCG tablet Take 800 mcg by mouth daily.    [provider]  metoprolol succinate (TOPROL-XL) 25 MG 24 hr tablet Take 1 tablet (25 mg total) by mouth daily. 02/15/19   Dunn, Areta Haber, PA-C  pantoprazole (PROTONIX) 40 MG tablet Take 40 mg by mouth daily. 12/31/18   [provider]  Potassium 99 MG TABS Take 1 tablet by mouth AS NEEDED    [provider]  tetrahydrozoline 0.05 % ophthalmic solution Place 1 drop into both eyes 3 (three) times daily as needed (dry eyes).    [provider]  triamterene-hydrochlorothiazide (MAXZIDE-25) 37.5-25 MG tablet TAKE 1/2 TAB BY MOUTH DAILY Patient taking differently: Take 0.5 tablets by mouth daily.  01/16/16   Burnard Hawthorne, FNP    Allergies Ace inhibitors, Codeine, Demerol [meperidine], Erythromycin, Iodinated diagnostic agents, Other, and Penicillins  Family Hx Family History  Problem  Relation Age of Onset  . Hyperlipidemia Sister   . Hypertension Sister   . Macular degeneration Sister   . Dementia Sister   . Congestive Heart Failure Mother   . Bone cancer Father   . Cancer Father        blood cancer  . Diabetes Neg Hx   . Heart disease Neg Hx     Social Hx Social History   Tobacco Use  . Smoking status: Former Smoker    Packs/day: 0.25    Years: 55.00    Pack years: 13.75    Types: Cigarettes    Quit date: 12/07/2015    Years since quitting: 3.2  . Smokeless tobacco: Never Used  . Tobacco comment: smokes about 2 cigarettes a day  Substance Use Topics  . Alcohol use: Not Currently    Alcohol/week: 0.0 standard drinks    Comment: 8 oz Budweiser every afternoon   . Drug use: No     Review of Systems  Constitutional: Negative for fever, chills. Eyes: Negative for visual changes. ENT: Negative for sore throat. Cardiovascular: Negative for chest pain. Respiratory:  Negative for shortness of breath. Gastrointestinal: + for nausea, vomiting, diarrhea.  Genitourinary: Negative for dysuria. Musculoskeletal: Negative for leg swelling. Skin: Negative for rash. Neurological: Negative for for headaches.   Physical Exam  Vital Signs: ED Triage Vitals  Enc Vitals Group     BP 02/23/19 1746 122/80     Pulse Rate 02/23/19 1746 81     Resp 02/23/19 1746 18     Temp 02/23/19 1746 98.2 F (36.8 C)     Temp Source 02/23/19 1746 Oral     SpO2 02/23/19 1746 96 %     Weight 02/23/19 1747 152 lb (68.9 kg)     Height 02/23/19 1747 5\' 2"  (1.575 m)     Head Circumference --      Peak Flow --      Pain Score 02/23/19 1747 0     Pain Loc --      Pain Edu? --      Excl. in [place]? --     Constitutional: Alert and oriented. Somewhat fatigued. Head: Normocephalic. Atraumatic. Eyes: Conjunctivae clear. Sclera anicteric. Nose: No congestion. No rhinorrhea. Mouth/Throat: Mucous membranes are slightly dry.  Neck: No stridor.   Cardiovascular: Normal rate, regular rhythm.  Extremities well perfused. Respiratory: Normal respiratory effort.   Gastrointestinal: Soft. Non-tender throughout. Non-distended.  Rectal: RN chaperone present.  Brown stool, guaiac negative. Musculoskeletal: No lower extremity edema. No deformities. Neurologic:  Normal speech and language. No gross focal neurologic deficits are appreciated.  Skin: Skin is warm, dry and intact. No rash noted. Psychiatric: Mood and affect are appropriate for situation.  EKG  Personally reviewed.   Rate: 77 Rhythm: sinus Axis: normal Intervals: WNL No acute ischemic changes No STEMI    Radiology  XR: no pneumomediastinum on my review   Procedures  Procedure(s) performed (including critical care):  Procedures   Initial Impression / Assessment and Plan / ED Course  73 y.o. female who presents to the ED for vomiting, diarrhea, as above.  Labs initiated in triage reveal mild hypokalemia 2.9  (patient states she has a history of same and is aware of this, likely worsened by her GI losses recently).  Otherwise, blood work is without actionable derangements.  H&H within normal limits, lipase within normal limits, urinalysis negative for infection.  Suspect the dark stools that the patient is seeing could be related to her  EGD biopsies.  Her H&H is normal and she is guaiac negative here, and is HDS which is reassuring.  We will give IV fluids, symptom control, IV PPI and reassess for PO challenge.  Her symptoms could also be related to viral process, will swab for COVID and flu. She understands the COVID, and likely flu swab, will result after discharge from the ED and understands the need for social distancing/quarantining until results. Will send for stool studies if she provides a sample here, but no high risk features such as recent antibiotics or exotic travel. No history of AF or pain out of proportion suggestive of mesenteric ischemia.   Patient has tolerated PO, no diarrhea while in the ED. As such, will plan for discharge with Rx for Zofran and advised continued supportive care. Discussed taking her potassium supplement at home, and given information for potassium rich foods. Given return precautions. She voices understanding and is comfortable with the plan and discharge.   Final Clinical Impression(s) / ED Diagnosis  Final diagnoses:  Nausea vomiting and diarrhea       Note:  This document was prepared using Dragon voice recognition software and may include unintentional dictation errors.   Lilia Pro., MD 02/23/19 2102

## 2019-02-23 NOTE — ED Triage Notes (Signed)
Pt sent from [facility] for eval of black stools and vomiting.  Pt has weakness and dizziness.  Denies abd pain, chest pain or sob.   Pt alert  Speech clear.

## 2019-02-23 NOTE — ED Notes (Signed)
Pt states that over that past few days she has passed black stool and has been vomiting. States that she has been unable to keep anything down besides gatorade. States that her stomach feels like it is burning and that it "feels like fire inside".

## 2019-02-23 NOTE — Discharge Instructions (Addendum)
Thank you for letting us take care of you in the emergency department today.   Please continue to take any regular, prescribed medications.   Use your prescription for the Zofran as needed for nausea, upset stomach.  Please follow up with: - Your GI doctor - Your primary care doctor to review your ER visit and follow up on your symptoms.   Please return to the ER for any new or worsening symptoms.

## 2019-02-24 ENCOUNTER — Encounter: Payer: Self-pay | Admitting: Cardiovascular Disease

## 2019-02-24 MED ORDER — ATORVASTATIN CALCIUM 80 MG PO TABS: 80.0000 mg | ORAL_TABLET | Freq: Every day | ORAL | 1 refills | Status: DC

## 2019-02-24 NOTE — Telephone Encounter (Signed)
Requested Prescriptions   Signed Prescriptions Disp Refills  . atorvastatin (LIPITOR) 80 MG tablet 90 tablet 1    Sig: Take 1 tablet (80 mg total) by mouth daily.    Authorizing Provider: Rise Mu    Ordering User: Raelene Bott, Aigner Horseman L

## 2019-02-24 NOTE — Telephone Encounter (Signed)
*  STAT* If patient is at the pharmacy, call can be transferred to refill team.   1. Which medications need to be refilled? (please list name of each medication and dose if known) Atorvastatin  2. Which pharmacy/location (including street and city if local pharmacy) is medication to be sent to? [place]  3. Do they need a 30 day or 90 day supply? [place]

## 2019-02-25 LAB — NOVEL CORONAVIRUS, NAA (HOSP ORDER, SEND-OUT TO REF LAB; TAT 18-24 HRS): SARS-CoV-2, NAA: NOT DETECTED

## 2019-02-26 DIAGNOSIS — M7062 Trochanteric bursitis, left hip: Secondary | ICD-10-CM | POA: Diagnosis not present

## 2019-02-26 DIAGNOSIS — M5136 Other intervertebral disc degeneration, lumbar region: Secondary | ICD-10-CM | POA: Diagnosis not present

## 2019-02-26 DIAGNOSIS — M5416 Radiculopathy, lumbar region: Secondary | ICD-10-CM | POA: Diagnosis not present

## 2019-02-26 DIAGNOSIS — M7061 Trochanteric bursitis, right hip: Secondary | ICD-10-CM | POA: Diagnosis not present

## 2019-03-01 DIAGNOSIS — C44729 Squamous cell carcinoma of skin of left lower limb, including hip: Secondary | ICD-10-CM | POA: Diagnosis not present

## 2019-03-01 DIAGNOSIS — D485 Neoplasm of uncertain behavior of skin: Secondary | ICD-10-CM | POA: Diagnosis not present

## 2019-03-09 DIAGNOSIS — K2100 Gastro-esophageal reflux disease with esophagitis, without bleeding: Secondary | ICD-10-CM | POA: Diagnosis not present

## 2019-03-09 DIAGNOSIS — K581 Irritable bowel syndrome with constipation: Secondary | ICD-10-CM | POA: Diagnosis not present

## 2019-03-09 DIAGNOSIS — Z23 Encounter for immunization: Secondary | ICD-10-CM | POA: Diagnosis not present

## 2019-03-10 ENCOUNTER — Other Ambulatory Visit: Payer: PPO

## 2019-03-13 ENCOUNTER — Ambulatory Visit: Admit: 2019-03-13 | Payer: PPO

## 2019-03-13 SURGERY — PHACOEMULSIFICATION, CATARACT, WITH IOL INSERTION
Anesthesia: Choice | Laterality: Left

## 2019-03-22 DIAGNOSIS — E782 Mixed hyperlipidemia: Secondary | ICD-10-CM | POA: Diagnosis not present

## 2019-03-22 DIAGNOSIS — I10 Essential (primary) hypertension: Secondary | ICD-10-CM | POA: Diagnosis not present

## 2019-03-22 DIAGNOSIS — R739 Hyperglycemia, unspecified: Secondary | ICD-10-CM | POA: Diagnosis not present

## 2019-03-22 DIAGNOSIS — I2583 Coronary atherosclerosis due to lipid rich plaque: Secondary | ICD-10-CM | POA: Diagnosis not present

## 2019-03-22 DIAGNOSIS — I2510 Atherosclerotic heart disease of native coronary artery without angina pectoris: Secondary | ICD-10-CM | POA: Diagnosis not present

## 2019-03-24 DIAGNOSIS — Z85828 Personal history of other malignant neoplasm of skin: Secondary | ICD-10-CM | POA: Diagnosis not present

## 2019-03-24 DIAGNOSIS — C44722 Squamous cell carcinoma of skin of right lower limb, including hip: Secondary | ICD-10-CM | POA: Diagnosis not present

## 2019-03-26 ENCOUNTER — Other Ambulatory Visit: Payer: Self-pay

## 2019-03-26 MED ORDER — ATORVASTATIN CALCIUM 80 MG PO TABS: 80.0000 mg | ORAL_TABLET | Freq: Every day | ORAL | 1 refills | Status: DC

## 2019-03-26 NOTE — Telephone Encounter (Signed)
*  STAT* If patient is at the pharmacy, call can be transferred to refill team.   1. Which medications need to be refilled? (please list name of each medication and dose if known)  Atorvastatin  2. Which pharmacy/location (including street and city if local pharmacy) is medication to be sent to?  [address]  3. Do they need a 30 day or 90 day supply?[place]

## 2019-03-29 DIAGNOSIS — I2510 Atherosclerotic heart disease of native coronary artery without angina pectoris: Secondary | ICD-10-CM | POA: Diagnosis not present

## 2019-03-29 DIAGNOSIS — C8510 Unspecified B-cell lymphoma, unspecified site: Secondary | ICD-10-CM | POA: Diagnosis not present

## 2019-03-29 DIAGNOSIS — D0471 Carcinoma in situ of skin of right lower limb, including hip: Secondary | ICD-10-CM | POA: Diagnosis not present

## 2019-03-29 DIAGNOSIS — I2583 Coronary atherosclerosis due to lipid rich plaque: Secondary | ICD-10-CM | POA: Diagnosis not present

## 2019-03-29 DIAGNOSIS — Z0000 Encounter for general adult medical examination without abnormal findings: Secondary | ICD-10-CM | POA: Diagnosis not present

## 2019-03-29 DIAGNOSIS — I10 Essential (primary) hypertension: Secondary | ICD-10-CM | POA: Diagnosis not present

## 2019-03-29 DIAGNOSIS — E782 Mixed hyperlipidemia: Secondary | ICD-10-CM | POA: Diagnosis not present

## 2019-03-29 DIAGNOSIS — Z1231 Encounter for screening mammogram for malignant neoplasm of breast: Secondary | ICD-10-CM | POA: Diagnosis not present

## 2019-04-15 DIAGNOSIS — C44729 Squamous cell carcinoma of skin of left lower limb, including hip: Secondary | ICD-10-CM | POA: Diagnosis not present

## 2019-04-15 DIAGNOSIS — C44722 Squamous cell carcinoma of skin of right lower limb, including hip: Secondary | ICD-10-CM | POA: Diagnosis not present

## 2019-04-15 DIAGNOSIS — D485 Neoplasm of uncertain behavior of skin: Secondary | ICD-10-CM | POA: Diagnosis not present

## 2019-04-15 DIAGNOSIS — R208 Other disturbances of skin sensation: Secondary | ICD-10-CM | POA: Diagnosis not present

## 2019-04-19 ENCOUNTER — Ambulatory Visit (INDEPENDENT_AMBULATORY_CARE_PROVIDER_SITE_OTHER): Payer: PPO

## 2019-04-19 ENCOUNTER — Telehealth: Payer: Self-pay

## 2019-04-19 ENCOUNTER — Other Ambulatory Visit: Payer: Self-pay

## 2019-04-19 DIAGNOSIS — R42 Dizziness and giddiness: Secondary | ICD-10-CM

## 2019-04-19 NOTE — Telephone Encounter (Signed)
-----   Message from Rise Mu, PA-C sent at 04/19/2019  3:00 PM EST ----- Carotid artery ultrasound showed no evidence of stenosis in the bilateral carotid arteries with normal flow along with the vertebral and subclavian arteries.

## 2019-04-19 NOTE — Telephone Encounter (Signed)
Attempted to call patient. LMTCB 04/19/2019  Results of carotid u/s.

## 2019-04-19 NOTE — Telephone Encounter (Signed)
Call returned by patient. Results reviewed.no further questions or orders at this time.   Advised pt to call for any further questions or concerns.

## 2019-04-20 DIAGNOSIS — M5416 Radiculopathy, lumbar region: Secondary | ICD-10-CM | POA: Diagnosis not present

## 2019-04-20 DIAGNOSIS — M5136 Other intervertebral disc degeneration, lumbar region: Secondary | ICD-10-CM | POA: Diagnosis not present

## 2019-05-28 DIAGNOSIS — M7061 Trochanteric bursitis, right hip: Secondary | ICD-10-CM | POA: Diagnosis not present

## 2019-05-28 DIAGNOSIS — M5416 Radiculopathy, lumbar region: Secondary | ICD-10-CM | POA: Diagnosis not present

## 2019-05-28 DIAGNOSIS — M5136 Other intervertebral disc degeneration, lumbar region: Secondary | ICD-10-CM | POA: Diagnosis not present

## 2019-05-28 DIAGNOSIS — M7062 Trochanteric bursitis, left hip: Secondary | ICD-10-CM | POA: Diagnosis not present

## 2019-06-01 ENCOUNTER — Other Ambulatory Visit: Payer: Self-pay

## 2019-06-02 ENCOUNTER — Other Ambulatory Visit: Payer: Self-pay

## 2019-06-02 ENCOUNTER — Inpatient Hospital Stay: Payer: PPO | Attending: Oncology

## 2019-06-02 DIAGNOSIS — Z87442 Personal history of urinary calculi: Secondary | ICD-10-CM | POA: Diagnosis not present

## 2019-06-02 DIAGNOSIS — I10 Essential (primary) hypertension: Secondary | ICD-10-CM | POA: Diagnosis not present

## 2019-06-02 DIAGNOSIS — C8581 Other specified types of non-Hodgkin lymphoma, lymph nodes of head, face, and neck: Secondary | ICD-10-CM | POA: Diagnosis not present

## 2019-06-02 DIAGNOSIS — Z923 Personal history of irradiation: Secondary | ICD-10-CM | POA: Insufficient documentation

## 2019-06-02 DIAGNOSIS — Z8249 Family history of ischemic heart disease and other diseases of the circulatory system: Secondary | ICD-10-CM | POA: Insufficient documentation

## 2019-06-02 DIAGNOSIS — Z880 Allergy status to penicillin: Secondary | ICD-10-CM | POA: Insufficient documentation

## 2019-06-02 DIAGNOSIS — Z818 Family history of other mental and behavioral disorders: Secondary | ICD-10-CM | POA: Diagnosis not present

## 2019-06-02 DIAGNOSIS — Z821 Family history of blindness and visual loss: Secondary | ICD-10-CM | POA: Diagnosis not present

## 2019-06-02 DIAGNOSIS — Z87891 Personal history of nicotine dependence: Secondary | ICD-10-CM | POA: Diagnosis not present

## 2019-06-02 DIAGNOSIS — Z85828 Personal history of other malignant neoplasm of skin: Secondary | ICD-10-CM | POA: Insufficient documentation

## 2019-06-02 DIAGNOSIS — Z8349 Family history of other endocrine, nutritional and metabolic diseases: Secondary | ICD-10-CM | POA: Diagnosis not present

## 2019-06-02 DIAGNOSIS — Z885 Allergy status to narcotic agent status: Secondary | ICD-10-CM | POA: Insufficient documentation

## 2019-06-02 DIAGNOSIS — Z809 Family history of malignant neoplasm, unspecified: Secondary | ICD-10-CM | POA: Insufficient documentation

## 2019-06-02 DIAGNOSIS — I2510 Atherosclerotic heart disease of native coronary artery without angina pectoris: Secondary | ICD-10-CM | POA: Diagnosis not present

## 2019-06-02 DIAGNOSIS — Z79899 Other long term (current) drug therapy: Secondary | ICD-10-CM | POA: Diagnosis not present

## 2019-06-02 DIAGNOSIS — I252 Old myocardial infarction: Secondary | ICD-10-CM | POA: Insufficient documentation

## 2019-06-02 DIAGNOSIS — Z881 Allergy status to other antibiotic agents status: Secondary | ICD-10-CM | POA: Diagnosis not present

## 2019-06-02 DIAGNOSIS — Z8579 Personal history of other malignant neoplasms of lymphoid, hematopoietic and related tissues: Secondary | ICD-10-CM

## 2019-06-02 LAB — COMPREHENSIVE METABOLIC PANEL
ALT: 18 U/L (ref 0–44)
AST: 18 U/L (ref 15–41)
Albumin: 4.2 g/dL (ref 3.5–5.0)
Alkaline Phosphatase: 96 U/L (ref 38–126)
Anion gap: 10 (ref 5–15)
BUN: 18 mg/dL (ref 8–23)
CO2: 26 mmol/L (ref 22–32)
Calcium: 9.4 mg/dL (ref 8.9–10.3)
Chloride: 103 mmol/L (ref 98–111)
Creatinine, Ser: 0.91 mg/dL (ref 0.44–1.00)
GFR calc Af Amer: >60 mL/min (ref >60)
GFR calc non Af Amer: >60 mL/min (ref >60)
Glucose, Bld: 92 mg/dL (ref 70–99)
Potassium: 3.5 mmol/L (ref 3.5–5.1)
Sodium: 139 mmol/L (ref 135–145)
Total Bilirubin: 0.7 mg/dL (ref 0.3–1.2)
Total Protein: 7.7 g/dL (ref 6.5–8.1)

## 2019-06-02 LAB — URIC ACID: Uric Acid, Serum: 7.1 mg/dL (ref 2.5–7.1)

## 2019-06-02 LAB — CBC WITH DIFFERENTIAL/PLATELET
Abs Immature Granulocytes: 0.03 10*3/uL (ref 0.00–0.07)
Basophils Absolute: 0 10*3/uL (ref 0.0–0.1)
Basophils Relative: 0 %
Eosinophils Absolute: 0 10*3/uL (ref 0.0–0.5)
Eosinophils Relative: 1 %
HCT: 44.8 % (ref 36.0–46.0)
Hemoglobin: 14.2 g/dL (ref 12.0–15.0)
Immature Granulocytes: 0 %
Lymphocytes Relative: 16 %
Lymphs Abs: 1.1 10*3/uL (ref 0.7–4.0)
MCH: 29.8 pg (ref 26.0–34.0)
MCHC: 31.7 g/dL (ref 30.0–36.0)
MCV: 93.9 fL (ref 80.0–100.0)
Monocytes Absolute: 0.6 10*3/uL (ref 0.1–1.0)
Monocytes Relative: 9 %
Neutro Abs: 5.1 10*3/uL (ref 1.7–7.7)
Neutrophils Relative %: 74 %
Platelets: 379 10*3/uL (ref 150–400)
RBC: 4.77 MIL/uL (ref 3.87–5.11)
RDW: 13.5 % (ref 11.5–15.5)
WBC: 6.9 10*3/uL (ref 4.0–10.5)
nRBC: 0 % (ref 0.0–0.2)

## 2019-06-02 LAB — LACTATE DEHYDROGENASE: LDH: 158 U/L (ref 98–192)

## 2019-06-03 ENCOUNTER — Inpatient Hospital Stay (HOSPITAL_BASED_OUTPATIENT_CLINIC_OR_DEPARTMENT_OTHER): Payer: PPO | Admitting: Oncology

## 2019-06-03 ENCOUNTER — Encounter: Payer: Self-pay | Admitting: Oncology

## 2019-06-03 DIAGNOSIS — Z8579 Personal history of other malignant neoplasms of lymphoid, hematopoietic and related tissues: Secondary | ICD-10-CM

## 2019-06-03 DIAGNOSIS — C8581 Other specified types of non-Hodgkin lymphoma, lymph nodes of head, face, and neck: Secondary | ICD-10-CM | POA: Diagnosis not present

## 2019-06-03 NOTE — Progress Notes (Signed)
Patient verified using two identifiers for virtual visit via telephone today.  Has GI issues that are followed by GI MD.

## 2019-06-04 ENCOUNTER — Ambulatory Visit: Payer: PPO | Admitting: Oncology

## 2019-06-04 ENCOUNTER — Other Ambulatory Visit: Payer: PPO

## 2019-06-06 DIAGNOSIS — Z8579 Personal history of other malignant neoplasms of lymphoid, hematopoietic and related tissues: Secondary | ICD-10-CM | POA: Insufficient documentation

## 2019-06-06 DIAGNOSIS — Z8572 Personal history of non-Hodgkin lymphomas: Secondary | ICD-10-CM | POA: Insufficient documentation

## 2019-06-06 NOTE — Progress Notes (Signed)
HEMATOLOGY-ONCOLOGY TeleHEALTH VISIT PROGRESS NOTE  I connected with Susan Mcmillan on 06/06/19 at 10:15 AM EST by video enabled telemedicine visit and verified that I am speaking with the correct person using two identifiers. I discussed the limitations, risks, security and privacy concerns of performing an evaluation and management service by telemedicine and the availability of in-person appointments. I also discussed with the patient that there may be a patient responsible charge related to this service. The patient expressed understanding and agreed to proceed.   Other persons participating in the visit and their role in the encounter:  None  Patient's location: Home  Provider's location: office Chief Complaint: History of lymphoma.   INTERVAL HISTORY Susan Mcmillan is a 74 y.o. 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  No new complaints.  Denies any headache, double vision, night sweating, unintentional weight loss, night sweating.  Review of Systems  Constitutional: Negative for appetite change, chills, fatigue and fever.  HENT:   Negative for hearing loss and voice change.   Eyes: Negative for eye problems.  Respiratory: Negative for chest tightness and cough.   Cardiovascular: Negative for chest pain.  Gastrointestinal: Negative for abdominal distention, abdominal pain and blood in stool.  Endocrine: Negative for hot flashes.  Genitourinary: Negative for difficulty urinating and frequency.   Musculoskeletal: Negative for arthralgias.  Skin: Negative for itching and rash.  Neurological: Negative for extremity weakness.       Chronic right-sided weakness-mild  Hematological: Negative for adenopathy.  Psychiatric/Behavioral: Negative for confusion.    Past Medical History:  Diagnosis Date   . Anxiety   . CAD (coronary artery disease)    a. 11/2015 MV: mod mid-distal ant ischemia, EF 58% w/ apical HK;  b. 11/2015 Cath: LM nl, LAD 80/129m (fills vial collats from RPDA & D1), D1 60ost, LCX small, nl, RCA 50p/m, RPDA nl, RPAV min irregs, RPl1/2/3 min irregs, EF 50-55%.  . Cancer of brain ([place]) 03/2017  . Dental bridge present    bilateral top.  Permanent retainer - bottom.  . Dysuria   . History of kidney stones   . Hyperlipemia   . Hypertension   . Hypertensive heart disease   . IBS (irritable bowel syndrome)   . 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 Pernicious anemia   . Personal history of radiation therapy   . Personal history of tobacco use, presenting hazards to health 07/10/2015  . 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 Skin cancer   . Tobacco abuse    a. Still smoking ~ 1 cigarette/day.  Marland Kitchen Urethral caruncle   . Vertigo   . Vulvar adhesions 10/06/2013   perianal skin bridge noted at posterior fourchette and region of WLE surgical site  . Vulvar leukoplakia   . Vulvar pain    Past Surgical History:  Procedure Laterality Date  . ABDOMINAL HERNIA REPAIR  2014   Dr. Rochel Brome  . ABDOMINAL HYSTERECTOMY  1978  . APPENDECTOMY    . BRAIN SURGERY    . CARDIAC CATHETERIZATION Left 12/07/2015   Procedure: Left Heart Cath and Coronary Angiography;  Surgeon: Wellington Hampshire, MD;  Location: [facility];  Service: Cardiovascular;  Laterality: Left;  . CATARACT EXTRACTION W/PHACO Right 05/28/2018   Procedure: CATARACT EXTRACTION PHACO AND INTRAOCULAR LENS  PLACEMENT (IOC) RIGHT;  Surgeon: Marchia Meiers, MD;  Location: [facility];  Service: Ophthalmology;  Laterality: Right;  [place] 00:56 CDE 7.17 Fluid pack Lot # [id number]  . CATARACT EXTRACTION W/PHACO Left 01/06/2019   Procedure: CATARACT EXTRACTION PHACO AND INTRAOCULAR LENS PLACEMENT (IOC) LEFT  00:50.3  19.3%  9.71;  Surgeon: Leandrew Koyanagi, MD;  Location: [facility];  Service:  Ophthalmology;  Laterality: Left;  . CERVICAL BIOPSY     Dr. Enzo Bi  . CHOLECYSTECTOMY    . COLONOSCOPY WITH PROPOFOL N/A 02/09/2019   Procedure: COLONOSCOPY WITH PROPOFOL;  Surgeon: Lollie Sails, MD;  Location: [facility] ENDOSCOPY;  Service: Endoscopy;  Laterality: N/A;  . ESOPHAGOGASTRODUODENOSCOPY (EGD) WITH PROPOFOL N/A 02/09/2019   Procedure: ESOPHAGOGASTRODUODENOSCOPY (EGD) WITH PROPOFOL;  Surgeon: Lollie Sails, MD;  Location: [facility];  Service: Endoscopy;  Laterality: N/A;  . wide local excision  07/2013   vin 3 w/ margins involoved    Family History  Problem Relation Age of Onset  . Hyperlipidemia Sister   . Hypertension Sister   . Macular degeneration Sister   . Dementia Sister   . Congestive Heart Failure Mother   . Bone cancer Father   . Cancer Father        blood cancer  . Diabetes Neg Hx   . Heart disease Neg Hx     Social History   Socioeconomic History  . Marital status: Married    Spouse name: Not on file  . Number of children: Not on file  . Years of education: Not on file  . Highest education level: Not on file  Occupational History  . Occupation: retired  Tobacco Use  . Smoking status: Former Smoker    Packs/day: 0.25    Years: 55.00    Pack years: 13.75    Types: Cigarettes    Quit date: 12/07/2015    Years since quitting: 3.4  . Smokeless tobacco: Never Used  . Tobacco comment: smokes about 2 cigarettes a day  Substance and Sexual Activity  . Alcohol use: Not Currently    Alcohol/week: 0.0 standard drinks    Comment: 8 oz Budweiser every afternoon   . Drug use: No  . Sexual activity: Not Currently  Other Topics Concern  . Not on file  Social History Narrative   Lives in [place]. Previously worked [employer]. Husband with dementia. Children 2 sons.   Social Determinants of Health   Financial Resource Strain:   . Difficulty of Paying Living Expenses: Not on file  Food Insecurity:   . Worried About Charity fundraiser in the  Last Year: Not on file  . Ran Out of Food in the Last Year: Not on file  Transportation Needs:   . Lack of Transportation (Medical): Not on file  . Lack of Transportation (Non-Medical): Not on file  Physical Activity:   . Days of Exercise per Week: Not on file  . Minutes of Exercise per Session: Not on file  Stress:   . Feeling of Stress : Not on file  Social Connections:   . Frequency of Communication with Friends and Family: Not on file  . Frequency of Social Gatherings with Friends and Family: Not on file  . Attends Religious Services: Not on file  . Active Member of Clubs or Organizations: Not on file  . Attends Archivist Meetings: Not on file  . Marital Status: Not on file  Intimate Partner Violence:   . Fear of Current or Ex-Partner: Not on  file  . Emotionally Abused: Not on file  . Physically Abused: Not on file  . Sexually Abused: Not on file    Current Outpatient Medications on File Prior to Visit  Medication Sig Dispense Refill  . ALPRAZolam (XANAX) 0.25 MG tablet Take 2 tablets (0.5 mg total) by mouth at bedtime as needed. (Patient taking differently: Take 0.25 mg by mouth at bedtime as needed for sleep. ) 60 tablet 0  . atorvastatin (LIPITOR) 80 MG tablet Take 1 tablet (80 mg total) by mouth daily. 90 tablet 1  . Cholecalciferol (VITAMIN D3 PO) Take 1 tablet by mouth daily.    . Cyanocobalamin (VITAMIN B-12 PO) Take 1 tablet by mouth daily.    . diphenhydrAMINE (BENADRYL) 25 MG tablet Take 25 mg by mouth at bedtime.    Marland Kitchen FLUoxetine (PROZAC) 20 MG capsule TAKE 1 CAPSULE BY MOUTH EVERY DAY (Patient taking differently: Take 20 mg by mouth daily. ) 90 capsule 3  . folic acid (FOLVITE) Q000111Q MCG tablet Take 800 mcg by mouth daily.    Marland Kitchen LINZESS 72 MCG capsule prn    . metoprolol succinate (TOPROL-XL) 25 MG 24 hr tablet Take 1 tablet (25 mg total) by mouth daily. 90 tablet 2  . pantoprazole (PROTONIX) 40 MG tablet Take 40 mg by mouth daily.    . Potassium 99 MG TABS  Take 1 tablet by mouth    . tetrahydrozoline 0.05 % ophthalmic solution Place 1 drop into both eyes 3 (three) times daily as needed (dry eyes).    . triamterene-hydrochlorothiazide (MAXZIDE-25) 37.5-25 MG tablet TAKE 1/2 TAB BY MOUTH DAILY (Patient taking differently: Take 0.5 tablets by mouth daily. ) 90 tablet 1   No current facility-administered medications on file prior to visit.    Allergies  Allergen Reactions  . Ace Inhibitors Other (See Comments)  . Codeine Nausea And Vomiting  . Demerol [Meperidine] Other (See Comments)    Heavy sedation  . Erythromycin Nausea Only    "All Mycins cause nausea"  . Iodinated Diagnostic Agents Nausea And Vomiting  . Other Other (See Comments)  . Penicillins Nausea Only    Can do amoxicillin Has patient had a PCN reaction causing immediate rash, facial/tongue/throat swelling, SOB or lightheadedness with hypotension: Yes Has patient had a PCN reaction causing severe rash involving mucus membranes or skin necrosis: No Has patient had a PCN reaction that required hospitalization No Has patient had a PCN reaction occurring within the last 10 years: No If all of the above answers are "NO", then may proceed with Cephalosporin use.        Observations/Objective: Today's Vitals   06/03/19 1026  PainSc: 0-No pain   There is no height or weight on file to calculate BMI.  Physical Exam  Constitutional: No distress.  Neurological: She is alert.  Psychiatric: Mood normal.    CBC    Component Value Date/Time   WBC 6.9 06/02/2019 1312   RBC 4.77 06/02/2019 1312   HGB 14.2 06/02/2019 1312   HGB 14.5 07/27/2013 1511   HCT 44.8 06/02/2019 1312   HCT 43.6 07/27/2013 1511   PLT 379 06/02/2019 1312   PLT 253 07/27/2013 1511   MCV 93.9 06/02/2019 1312   MCV 94 07/27/2013 1511   MCH 29.8 06/02/2019 1312   MCHC 31.7 06/02/2019 1312   RDW 13.5 06/02/2019 1312   RDW 12.9 07/27/2013 1511   LYMPHSABS 1.1 06/02/2019 1312   MONOABS 0.6 06/02/2019 1312    EOSABS 0.0 06/02/2019 1312  BASOSABS 0.0 06/02/2019 1312    CMP     Component Value Date/Time   NA 139 06/02/2019 1312   NA 136 07/27/2013 1511   K 3.5 06/02/2019 1312   K 3.6 07/27/2013 1511   CL 103 06/02/2019 1312   CL 102 07/27/2013 1511   CO2 26 06/02/2019 1312   CO2 30 07/27/2013 1511   GLUCOSE 92 06/02/2019 1312   GLUCOSE 87 07/27/2013 1511   BUN 18 06/02/2019 1312   BUN 13 07/27/2013 1511   CREATININE 0.91 06/02/2019 1312   CREATININE 0.77 05/27/2014 1600   CALCIUM 9.4 06/02/2019 1312   CALCIUM 9.3 07/27/2013 1511   PROT 7.7 06/02/2019 1312   PROT 6.7 02/06/2016 0953   ALBUMIN 4.2 06/02/2019 1312   ALBUMIN 4.2 02/06/2016 0953   AST 18 06/02/2019 1312   ALT 18 06/02/2019 1312   ALKPHOS 96 06/02/2019 1312   BILITOT 0.7 06/02/2019 1312   BILITOT 0.5 02/06/2016 0953   GFRNONAA >60 06/02/2019 1312   GFRNONAA >60 07/27/2013 1511   GFRAA >60 06/02/2019 1312   GFRAA >60 07/27/2013 1511     Assessment and Plan: 1. History of lymphoma   Labs are reviewed and discussed with patient.  Clinically she is doing well. No new symptoms.  Denies any constitutional symptoms. I will obtain annual MRI brain for surveillance prior to her next visit in 6 months.  Follow Up Instructions: 6 months   I discussed the assessment and treatment plan with the patient. The patient was provided an opportunity to ask questions and all were answered. The patient agreed with the plan and demonstrated an understanding of the instructions.  The patient was advised to call back or seek an in-person evaluation if the symptoms worsen or if the condition fails to improve as anticipated.   Earlie Server, MD 06/06/2019 3:01 PM

## 2019-06-21 DIAGNOSIS — M5136 Other intervertebral disc degeneration, lumbar region: Secondary | ICD-10-CM | POA: Diagnosis not present

## 2019-06-21 DIAGNOSIS — M5416 Radiculopathy, lumbar region: Secondary | ICD-10-CM | POA: Diagnosis not present

## 2019-07-10 DIAGNOSIS — Z23 Encounter for immunization: Secondary | ICD-10-CM | POA: Diagnosis not present

## 2019-07-21 ENCOUNTER — Other Ambulatory Visit: Payer: Self-pay | Admitting: Physician Assistant

## 2019-07-21 DIAGNOSIS — Z1231 Encounter for screening mammogram for malignant neoplasm of breast: Secondary | ICD-10-CM

## 2019-07-22 DIAGNOSIS — D485 Neoplasm of uncertain behavior of skin: Secondary | ICD-10-CM | POA: Diagnosis not present

## 2019-07-22 DIAGNOSIS — D2262 Melanocytic nevi of left upper limb, including shoulder: Secondary | ICD-10-CM | POA: Diagnosis not present

## 2019-07-22 DIAGNOSIS — D2272 Melanocytic nevi of left lower limb, including hip: Secondary | ICD-10-CM | POA: Diagnosis not present

## 2019-07-22 DIAGNOSIS — D0461 Carcinoma in situ of skin of right upper limb, including shoulder: Secondary | ICD-10-CM | POA: Diagnosis not present

## 2019-07-22 DIAGNOSIS — X32XXXA Exposure to sunlight, initial encounter: Secondary | ICD-10-CM | POA: Diagnosis not present

## 2019-07-22 DIAGNOSIS — D2271 Melanocytic nevi of right lower limb, including hip: Secondary | ICD-10-CM | POA: Diagnosis not present

## 2019-07-22 DIAGNOSIS — L821 Other seborrheic keratosis: Secondary | ICD-10-CM | POA: Diagnosis not present

## 2019-07-22 DIAGNOSIS — L570 Actinic keratosis: Secondary | ICD-10-CM | POA: Diagnosis not present

## 2019-07-22 DIAGNOSIS — D225 Melanocytic nevi of trunk: Secondary | ICD-10-CM | POA: Diagnosis not present

## 2019-07-22 DIAGNOSIS — D2261 Melanocytic nevi of right upper limb, including shoulder: Secondary | ICD-10-CM | POA: Diagnosis not present

## 2019-07-28 DIAGNOSIS — R7303 Prediabetes: Secondary | ICD-10-CM | POA: Diagnosis not present

## 2019-07-28 DIAGNOSIS — E782 Mixed hyperlipidemia: Secondary | ICD-10-CM | POA: Diagnosis not present

## 2019-07-28 DIAGNOSIS — M5416 Radiculopathy, lumbar region: Secondary | ICD-10-CM | POA: Diagnosis not present

## 2019-07-28 DIAGNOSIS — I2583 Coronary atherosclerosis due to lipid rich plaque: Secondary | ICD-10-CM | POA: Diagnosis not present

## 2019-07-28 DIAGNOSIS — I2510 Atherosclerotic heart disease of native coronary artery without angina pectoris: Secondary | ICD-10-CM | POA: Diagnosis not present

## 2019-07-28 DIAGNOSIS — C8510 Unspecified B-cell lymphoma, unspecified site: Secondary | ICD-10-CM | POA: Diagnosis not present

## 2019-07-28 DIAGNOSIS — Z0000 Encounter for general adult medical examination without abnormal findings: Secondary | ICD-10-CM | POA: Diagnosis not present

## 2019-07-28 DIAGNOSIS — I10 Essential (primary) hypertension: Secondary | ICD-10-CM | POA: Diagnosis not present

## 2019-07-28 DIAGNOSIS — D0471 Carcinoma in situ of skin of right lower limb, including hip: Secondary | ICD-10-CM | POA: Diagnosis not present

## 2019-08-20 ENCOUNTER — Other Ambulatory Visit: Payer: Self-pay

## 2019-08-20 ENCOUNTER — Encounter: Payer: Self-pay | Admitting: Family

## 2019-08-20 ENCOUNTER — Ambulatory Visit: Payer: PPO | Admitting: Family

## 2019-08-20 VITALS — BP 134/72 | HR 76 | Ht 62.0 in | Wt 157.0 lb

## 2019-08-20 DIAGNOSIS — I10 Essential (primary) hypertension: Secondary | ICD-10-CM | POA: Diagnosis not present

## 2019-08-20 DIAGNOSIS — E785 Hyperlipidemia, unspecified: Secondary | ICD-10-CM | POA: Diagnosis not present

## 2019-08-20 DIAGNOSIS — R42 Dizziness and giddiness: Secondary | ICD-10-CM | POA: Diagnosis not present

## 2019-08-20 DIAGNOSIS — I25118 Atherosclerotic heart disease of native coronary artery with other forms of angina pectoris: Secondary | ICD-10-CM

## 2019-08-20 DIAGNOSIS — Z720 Tobacco use: Secondary | ICD-10-CM | POA: Diagnosis not present

## 2019-08-20 MED ORDER — ATORVASTATIN CALCIUM 80 MG PO TABS: 80.0000 mg | ORAL_TABLET | Freq: Every day | ORAL | 3 refills | Status: DC

## 2019-08-20 NOTE — Patient Instructions (Signed)
Medication Instructions:  No medication changes today.   *If you need a refill on your cardiac medications before your next appointment, please call your pharmacy*   Lab Work: None today.  If you have labs (blood work) drawn today and your tests are completely normal, you will receive your results only by: Marland Kitchen MyChart Message (if you have MyChart) OR . A paper copy in the mail If you have any lab test that is abnormal or we need to change your treatment, we will call you to review the results.  Testing/Procedures: Your EKG shows normal sinus rhythm. This is a good result!  Follow-Up: At [facility], you and your health needs are our priority.  As part of our continuing mission to provide you with exceptional heart care, we have created designated Provider Care Teams.  These Care Teams include your primary Cardiologist (physician) and Advanced Practice Providers (APPs -  Physician Assistants and Nurse Practitioners) who all work together to provide you with the care you need, when you need it.  We recommend signing up for the patient portal called "MyChart".  Sign up information is provided on this After Visit Summary.  MyChart is used to connect with patients for Virtual Visits (Telemedicine).  Patients are able to view lab/test results, encounter notes, upcoming appointments, etc.  Non-urgent messages can be sent to your provider as well.   To learn more about what you can do with MyChart, go to [email or web address].    Your next appointment:   6 month(s)  The format for your next appointment:   In Person  Provider:   You may see Kathlyn Sacramento, MD or one of the following Advanced Practice Providers on your designated Care Team:    Murray Hodgkins, NP  Christell Faith, PA-C  Marrianne Mood, PA-C  Other Instructions   Keep up the good work reducing your cigarettes. You're doing great!   If you blood pressure is consistently more than 130/80 at home, let us or Dr.  Carrie Mew know.    Make your position changes slowly to try to prevent getting lightheaded.

## 2019-08-20 NOTE — Progress Notes (Signed)
Office Visit    Patient Name: Susan Mcmillan Date of Encounter: 08/20/2019  Primary Care Provider:  Marinda Elk, MD Primary Cardiologist:  Kathlyn Sacramento, MD Electrophysiologist:  None   Chief Complaint    Susan Mcmillan is a 74 y.o. 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 Anxiety   . CAD (coronary artery disease)    a. 11/2015 MV: mod mid-distal ant ischemia, EF 58% w/ apical HK;  b. 11/2015 Cath: LM nl, LAD 80/164m (fills vial collats from RPDA & D1), D1 60ost, LCX small, nl, RCA 50p/m, RPDA nl, RPAV min irregs, RPl1/2/3 min irregs, EF 50-55%.  . Cancer of brain ([place]) 03/2017  . Dental bridge present    bilateral top.  Permanent retainer - bottom.  . Dysuria   . History of kidney stones   . Hyperlipemia   . Hypertension   . Hypertensive heart disease   . IBS (irritable bowel syndrome)   . 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 Pernicious anemia   . Personal history of radiation therapy   . Personal history of tobacco use, presenting hazards to health 07/10/2015  . 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 Skin cancer   . Tobacco abuse    a. Still smoking ~ 1 cigarette/day.  Marland Kitchen Urethral caruncle   . Vertigo   . Vulvar adhesions 10/06/2013   perianal skin bridge noted at posterior fourchette and region of WLE surgical site  . Vulvar leukoplakia   . Vulvar pain    Past Surgical History:  Procedure Laterality Date  . ABDOMINAL HERNIA REPAIR  2014   Dr. Rochel Brome  . ABDOMINAL HYSTERECTOMY  1978  . APPENDECTOMY    . BRAIN SURGERY    . CARDIAC CATHETERIZATION Left 12/07/2015   Procedure: Left Heart Cath and Coronary Angiography;  Surgeon: Wellington Hampshire, MD;  Location: [facility];  Service:  Cardiovascular;  Laterality: Left;  . CATARACT EXTRACTION W/PHACO Right 05/28/2018   Procedure: CATARACT EXTRACTION PHACO AND INTRAOCULAR LENS PLACEMENT ([place]) RIGHT;  Surgeon: Marchia Meiers, MD;  Location: [facility];  Service: Ophthalmology;  Laterality: Right;  [place] 00:56 CDE 7.17 Fluid pack Lot # [id number]  . CATARACT EXTRACTION W/PHACO Left 01/06/2019   Procedure: CATARACT EXTRACTION PHACO AND INTRAOCULAR LENS PLACEMENT (IOC) LEFT  00:50.3  19.3%  9.71;  Surgeon: Leandrew Koyanagi, MD;  Location: [place];  Service: Ophthalmology;  Laterality: Left;  . CERVICAL BIOPSY     Dr. Enzo Bi  . CHOLECYSTECTOMY    . COLONOSCOPY WITH PROPOFOL N/A 02/09/2019   Procedure: COLONOSCOPY WITH PROPOFOL;  Surgeon: Lollie Sails, MD;  Location: [facility] END[facility]COPY;  Service: Endoscopy;  Laterality: N/A;  . ESOPHAGOGASTRODUODENOSCOPY (EGD) WITH PROPOFOL N/A 02/09/2019   Procedure: ESOPHAGOGASTRODUODENOSCOPY (EGD) WITH PROPOFOL;  Surgeon: Lollie Sails, MD;  Location[facility];  Service: Endoscopy;  Laterality: N/A;  . wide local excision  07/2013   vin 3 w/ margins involoved    Allergies  Allergies  Allergen Reactions  . Ace Inhibitors Other (See Comments)  . Codeine Nausea And Vomiting  . Demerol [Meperidine] Other (See Comments)    Heavy sedation  . Erythromycin Nausea Only    "All  Mycins cause nausea"  . Iodinated Diagnostic Agents Nausea And Vomiting  . Other Other (See Comments)  . Penicillins Nausea Only    Can do amoxicillin Has patient had a PCN reaction causing immediate rash, facial/tongue/throat swelling, SOB or lightheadedness with hypotension: Yes Has patient had a PCN reaction causing severe rash involving mucus membranes or skin necrosis: No Has patient had a PCN reaction that required hospitalization No Has patient had a PCN reaction occurring within the last 10 years: No If all of the above answers are "NO", then may proceed with Cephalosporin use.      History of Present Illness    Susan Mcmillan is a 74 y.o. 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  March 2017 underwent CT of chest for follow-up of pulmonary nodule with incidental finding of multivessel CAD.  EKG at that time with anterior T wave inversion.  Underwent stress testing with moderate mid to distal anterior wall ischemia with normal LV systolic function and apical hypokinesis.  Diagnostic cardiac cath July 2017 with CTO of mid LAD with left to right collaterals and left left collaterals and otherwise moderate RCA and branch vessel disease.  LV gram with EF 50-65% with mild anterior wall hypokinesis.  Recommended for medical management.  At visit 02/2019 she noted intermittent positional dizziness without falls or syncope.  Reports no chest pain, pressure, tightness. Reports no SOB. Reports her DOE is stable at her baseline. Stays active managing her home and enjoys spending time with her 6 year old grandson.  She still gets intermittent lightheadedness with position changes. She attributes this more so to her history of brain cancer and has upcoming follow up with her oncologist. We reviewed orthostatic precautions including making position changes slowly and staying well hydrated.  Checks BP routinely at home. Reports no low readings. BP primarily at goal with SBP 120-130. Very rare reading in the 150s. Reports compliance with all of her medications.   EKGs/Labs/Other Studies Reviewed:   The following studies were reviewed today:  EKG:  EKG is ordered today.  The ekg ordered today demonstrates SR 76 bpm with stable TWI in inferolateral leads.  Recent Labs: 06/02/2019: ALT 18; BUN 18; Creatinine, Ser 0.91; Hemoglobin 14.2; Platelets 379; Potassium 3.5; Sodium 139  Recent Lipid Panel    Component Value Date/Time    CHOL 149 02/06/2016 0953   TRIG 155 (H) 02/06/2016 0953   HDL 54 02/06/2016 0953   CHOLHDL 2.8 02/06/2016 0953   CHOLHDL 4 05/17/2015 1442   VLDL 57.6 (H) 05/17/2015 1442   LDLCALC 64 02/06/2016 0953   LDLDIRECT 88.0 05/17/2015 1442    Home Medications   No outpatient medications have been marked as taking for the 08/20/19 encounter (Appointment) with Loel Dubonnet, NP.      Review of Systems      Review of Systems  Constitution: Negative for chills, fever and malaise/fatigue.  Cardiovascular: Positive for dyspnea on exertion. Negative for chest pain, leg swelling, near-syncope, orthopnea, palpitations and syncope.  Respiratory: Negative for cough, shortness of breath and wheezing.   Gastrointestinal: Negative for nausea and vomiting.  Neurological: Positive for light-headedness. Negative for dizziness and weakness.   All other systems reviewed and are otherwise negative except as noted above.  Physical Exam    VS:  There were no vitals taken for this  visit. , BMI There is no height or weight on file to calculate BMI. GEN: Well nourished, well developed, in no acute distress. HEENT: normal. Neck: Supple, no JVD, carotid bruits, or masses. Cardiac: RRR, no murmurs, rubs, or gallops. No clubbing, cyanosis, edema.  Radials/DP/PT 2+ and equal bilaterally.  Respiratory:  Respirations regular and unlabored, clear to auscultation bilaterally. GI: Soft, nontender, nondistended, BS + x 4. MS: No deformity or atrophy. Skin: Warm and dry, no rash. Neuro:  Strength and sensation are intact. Psych: Normal affect.  Assessment & Plan    1. CAD - Stable with no anginal symptoms. No indication for ischemic evaluation at this time. GDMT includes beta blocker and statin.  2. HTN - BP well controlled. Continue present antihypertensive regimen of HCTZ-HCTS 37.5-25mg  half tablet daily and Toprol 25mg  daily. Home BP 120s-130s with rare reading of systolic Q000111Q. Educated to report BP  consistently >130/80. Careful titration of BP medications in setting of positional lightheadedness.  3. HLD, LDL goal less than 70 - Continue Atorvastatin 80mg  daily, refill provided today. She politely declines lab work today as she anticipates upcoming labs with her PCP.  4. Positional lightheadedness- Reports this is stable. Encouraged to remain adequately hydrated and make position changes slowly. Reports no low BP readings at home. Hx of brain cancer and sequelae may be contributory, follows with oncology closely.  5. Tobacco abuse - Congratulated her on cutting back. Complete cessation recommended.   Disposition: Follow up in 6 month(s) with Dr. Fletcher Anon or APP.    Loel Dubonnet, NP 08/20/2019, 2:30 PM

## 2019-08-25 DIAGNOSIS — M5136 Other intervertebral disc degeneration, lumbar region: Secondary | ICD-10-CM | POA: Diagnosis not present

## 2019-08-25 DIAGNOSIS — M7061 Trochanteric bursitis, right hip: Secondary | ICD-10-CM | POA: Diagnosis not present

## 2019-08-25 DIAGNOSIS — M7062 Trochanteric bursitis, left hip: Secondary | ICD-10-CM | POA: Diagnosis not present

## 2019-08-25 DIAGNOSIS — M5416 Radiculopathy, lumbar region: Secondary | ICD-10-CM | POA: Diagnosis not present

## 2019-08-31 DIAGNOSIS — Z961 Presence of intraocular lens: Secondary | ICD-10-CM | POA: Diagnosis not present

## 2019-09-20 DIAGNOSIS — M5136 Other intervertebral disc degeneration, lumbar region: Secondary | ICD-10-CM | POA: Diagnosis not present

## 2019-09-20 DIAGNOSIS — M5416 Radiculopathy, lumbar region: Secondary | ICD-10-CM | POA: Diagnosis not present

## 2019-09-21 DIAGNOSIS — E538 Deficiency of other specified B group vitamins: Secondary | ICD-10-CM | POA: Diagnosis not present

## 2019-09-21 DIAGNOSIS — K2100 Gastro-esophageal reflux disease with esophagitis, without bleeding: Secondary | ICD-10-CM | POA: Diagnosis not present

## 2019-09-21 DIAGNOSIS — K581 Irritable bowel syndrome with constipation: Secondary | ICD-10-CM | POA: Diagnosis not present

## 2019-09-22 ENCOUNTER — Ambulatory Visit
Admission: RE | Admit: 2019-09-22 | Discharge: 2019-09-22 | Disposition: A | Discharge status: Home / Self Care | Payer: PPO | Source: Ambulatory Visit | Attending: Physician Assistant | Admitting: Physician Assistant

## 2019-09-22 DIAGNOSIS — Z1231 Encounter for screening mammogram for malignant neoplasm of breast: Secondary | ICD-10-CM | POA: Diagnosis not present

## 2019-10-05 ENCOUNTER — Telehealth: Payer: Self-pay | Admitting: *Deleted

## 2019-10-05 DIAGNOSIS — Z122 Encounter for screening for malignant neoplasm of respiratory organs: Secondary | ICD-10-CM

## 2019-10-05 DIAGNOSIS — Z87891 Personal history of nicotine dependence: Secondary | ICD-10-CM

## 2019-10-05 NOTE — Telephone Encounter (Signed)
Patient has been notified that annual lung cancer screening low dose CT scan is due currently or will be in near future. Confirmed that patient is within the age range of 55-77, and asymptomatic, (no signs or symptoms of lung cancer). Patient denies illness that would prevent curative treatment for lung cancer if found. Verified smoking history, (current, 30.9 pack year). The shared decision making visit was done 06/15/14. Patient is agreeable for CT scan being scheduled.

## 2019-10-15 ENCOUNTER — Other Ambulatory Visit: Payer: Self-pay | Admitting: Physical Medicine and Rehabilitation

## 2019-10-15 DIAGNOSIS — G8929 Other chronic pain: Secondary | ICD-10-CM

## 2019-10-25 DIAGNOSIS — M720 Palmar fascial fibromatosis [Dupuytren]: Secondary | ICD-10-CM | POA: Diagnosis not present

## 2019-10-25 DIAGNOSIS — M1811 Unilateral primary osteoarthritis of first carpometacarpal joint, right hand: Secondary | ICD-10-CM | POA: Diagnosis not present

## 2019-10-29 ENCOUNTER — Other Ambulatory Visit: Payer: Self-pay

## 2019-10-29 ENCOUNTER — Ambulatory Visit
Admission: RE | Admit: 2019-10-29 | Discharge: 2019-10-29 | Disposition: A | Discharge status: Home / Self Care | Payer: PPO | Source: Ambulatory Visit | Attending: Physical Medicine and Rehabilitation | Admitting: Physical Medicine and Rehabilitation

## 2019-10-29 DIAGNOSIS — G8929 Other chronic pain: Secondary | ICD-10-CM | POA: Insufficient documentation

## 2019-10-29 DIAGNOSIS — M5442 Lumbago with sciatica, left side: Secondary | ICD-10-CM | POA: Diagnosis not present

## 2019-10-29 DIAGNOSIS — M545 Low back pain: Secondary | ICD-10-CM | POA: Diagnosis not present

## 2019-10-29 DIAGNOSIS — M5441 Lumbago with sciatica, right side: Secondary | ICD-10-CM | POA: Insufficient documentation

## 2019-11-04 ENCOUNTER — Ambulatory Visit
Admission: RE | Admit: 2019-11-04 | Discharge: 2019-11-04 | Disposition: A | Discharge status: Home / Self Care | Payer: PPO | Source: Ambulatory Visit | Attending: Oncology | Admitting: Oncology

## 2019-11-04 ENCOUNTER — Other Ambulatory Visit: Payer: Self-pay

## 2019-11-04 DIAGNOSIS — Z87891 Personal history of nicotine dependence: Secondary | ICD-10-CM

## 2019-11-04 DIAGNOSIS — Z122 Encounter for screening for malignant neoplasm of respiratory organs: Secondary | ICD-10-CM | POA: Diagnosis not present

## 2019-11-10 ENCOUNTER — Encounter: Payer: Self-pay | Admitting: *Deleted

## 2019-11-23 DIAGNOSIS — K581 Irritable bowel syndrome with constipation: Secondary | ICD-10-CM | POA: Diagnosis not present

## 2019-11-23 DIAGNOSIS — R1013 Epigastric pain: Secondary | ICD-10-CM | POA: Diagnosis not present

## 2019-11-24 DIAGNOSIS — M5416 Radiculopathy, lumbar region: Secondary | ICD-10-CM | POA: Diagnosis not present

## 2019-11-24 DIAGNOSIS — M5136 Other intervertebral disc degeneration, lumbar region: Secondary | ICD-10-CM | POA: Diagnosis not present

## 2019-11-24 DIAGNOSIS — M7061 Trochanteric bursitis, right hip: Secondary | ICD-10-CM | POA: Diagnosis not present

## 2019-11-24 DIAGNOSIS — M7062 Trochanteric bursitis, left hip: Secondary | ICD-10-CM | POA: Diagnosis not present

## 2019-11-29 ENCOUNTER — Ambulatory Visit
Admission: RE | Admit: 2019-11-29 | Discharge: 2019-11-29 | Disposition: A | Discharge status: Home / Self Care | Payer: PPO | Source: Ambulatory Visit | Attending: Oncology | Admitting: Oncology

## 2019-11-29 ENCOUNTER — Other Ambulatory Visit: Payer: Self-pay

## 2019-11-29 ENCOUNTER — Ambulatory Visit: Payer: PPO

## 2019-11-29 DIAGNOSIS — Z8572 Personal history of non-Hodgkin lymphomas: Secondary | ICD-10-CM

## 2019-11-29 DIAGNOSIS — G9389 Other specified disorders of brain: Secondary | ICD-10-CM | POA: Diagnosis not present

## 2019-11-29 DIAGNOSIS — C8590 Non-Hodgkin lymphoma, unspecified, unspecified site: Secondary | ICD-10-CM | POA: Diagnosis not present

## 2019-11-29 DIAGNOSIS — Z8579 Personal history of other malignant neoplasms of lymphoid, hematopoietic and related tissues: Secondary | ICD-10-CM | POA: Diagnosis not present

## 2019-11-29 MED ORDER — GADOBUTROL 1 MMOL/ML IV SOLN
7.0000 mL | Freq: Once | INTRAVENOUS | Status: AC | PRN
  Administered 2019-11-29: 7 mL via INTRAVENOUS

## 2019-12-01 ENCOUNTER — Inpatient Hospital Stay: Payer: PPO | Attending: Oncology

## 2019-12-01 ENCOUNTER — Other Ambulatory Visit: Payer: Self-pay

## 2019-12-01 ENCOUNTER — Encounter: Payer: Self-pay | Admitting: Oncology

## 2019-12-01 ENCOUNTER — Inpatient Hospital Stay (HOSPITAL_BASED_OUTPATIENT_CLINIC_OR_DEPARTMENT_OTHER): Payer: PPO | Admitting: Oncology

## 2019-12-01 VITALS — BP 123/82 | HR 56 | Temp 96.7°F | Resp 16 | Wt 158.1 lb

## 2019-12-01 DIAGNOSIS — Z881 Allergy status to other antibiotic agents status: Secondary | ICD-10-CM | POA: Diagnosis not present

## 2019-12-01 DIAGNOSIS — I119 Hypertensive heart disease without heart failure: Secondary | ICD-10-CM | POA: Diagnosis not present

## 2019-12-01 DIAGNOSIS — R531 Weakness: Secondary | ICD-10-CM | POA: Insufficient documentation

## 2019-12-01 DIAGNOSIS — Z880 Allergy status to penicillin: Secondary | ICD-10-CM | POA: Diagnosis not present

## 2019-12-01 DIAGNOSIS — Z818 Family history of other mental and behavioral disorders: Secondary | ICD-10-CM | POA: Insufficient documentation

## 2019-12-01 DIAGNOSIS — Z808 Family history of malignant neoplasm of other organs or systems: Secondary | ICD-10-CM | POA: Insufficient documentation

## 2019-12-01 DIAGNOSIS — Z720 Tobacco use: Secondary | ICD-10-CM

## 2019-12-01 DIAGNOSIS — Z923 Personal history of irradiation: Secondary | ICD-10-CM | POA: Insufficient documentation

## 2019-12-01 DIAGNOSIS — R251 Tremor, unspecified: Secondary | ICD-10-CM

## 2019-12-01 DIAGNOSIS — R202 Paresthesia of skin: Secondary | ICD-10-CM | POA: Insufficient documentation

## 2019-12-01 DIAGNOSIS — Z8579 Personal history of other malignant neoplasms of lymphoid, hematopoietic and related tissues: Secondary | ICD-10-CM

## 2019-12-01 DIAGNOSIS — Z87442 Personal history of urinary calculi: Secondary | ICD-10-CM | POA: Insufficient documentation

## 2019-12-01 DIAGNOSIS — Z8349 Family history of other endocrine, nutritional and metabolic diseases: Secondary | ICD-10-CM | POA: Insufficient documentation

## 2019-12-01 DIAGNOSIS — K589 Irritable bowel syndrome without diarrhea: Secondary | ICD-10-CM | POA: Diagnosis not present

## 2019-12-01 DIAGNOSIS — C884 Extranodal marginal zone B-cell lymphoma of mucosa-associated lymphoid tissue [MALT-lymphoma]: Secondary | ICD-10-CM | POA: Insufficient documentation

## 2019-12-01 DIAGNOSIS — R200 Anesthesia of skin: Secondary | ICD-10-CM | POA: Insufficient documentation

## 2019-12-01 DIAGNOSIS — Z885 Allergy status to narcotic agent status: Secondary | ICD-10-CM | POA: Insufficient documentation

## 2019-12-01 DIAGNOSIS — I252 Old myocardial infarction: Secondary | ICD-10-CM | POA: Insufficient documentation

## 2019-12-01 DIAGNOSIS — Z809 Family history of malignant neoplasm, unspecified: Secondary | ICD-10-CM | POA: Diagnosis not present

## 2019-12-01 DIAGNOSIS — I2510 Atherosclerotic heart disease of native coronary artery without angina pectoris: Secondary | ICD-10-CM | POA: Insufficient documentation

## 2019-12-01 DIAGNOSIS — F17210 Nicotine dependence, cigarettes, uncomplicated: Secondary | ICD-10-CM | POA: Insufficient documentation

## 2019-12-01 DIAGNOSIS — L570 Actinic keratosis: Secondary | ICD-10-CM | POA: Diagnosis not present

## 2019-12-01 DIAGNOSIS — Z79899 Other long term (current) drug therapy: Secondary | ICD-10-CM | POA: Diagnosis not present

## 2019-12-01 DIAGNOSIS — Z85828 Personal history of other malignant neoplasm of skin: Secondary | ICD-10-CM | POA: Insufficient documentation

## 2019-12-01 DIAGNOSIS — Z821 Family history of blindness and visual loss: Secondary | ICD-10-CM | POA: Insufficient documentation

## 2019-12-01 DIAGNOSIS — L821 Other seborrheic keratosis: Secondary | ICD-10-CM | POA: Diagnosis not present

## 2019-12-01 DIAGNOSIS — Z8249 Family history of ischemic heart disease and other diseases of the circulatory system: Secondary | ICD-10-CM | POA: Diagnosis not present

## 2019-12-01 DIAGNOSIS — X32XXXA Exposure to sunlight, initial encounter: Secondary | ICD-10-CM | POA: Diagnosis not present

## 2019-12-01 DIAGNOSIS — L814 Other melanin hyperpigmentation: Secondary | ICD-10-CM | POA: Diagnosis not present

## 2019-12-01 LAB — CBC WITH DIFFERENTIAL/PLATELET
Abs Immature Granulocytes: 0.09 10*3/uL — ABNORMAL HIGH (ref 0.00–0.07)
Basophils Absolute: 0.1 10*3/uL (ref 0.0–0.1)
Basophils Relative: 1 %
Eosinophils Absolute: 0 10*3/uL (ref 0.0–0.5)
Eosinophils Relative: 0 %
HCT: 43 % (ref 36.0–46.0)
Hemoglobin: 14.7 g/dL (ref 12.0–15.0)
Immature Granulocytes: 1 %
Lymphocytes Relative: 12 %
Lymphs Abs: 1.1 10*3/uL (ref 0.7–4.0)
MCH: 30.9 pg (ref 26.0–34.0)
MCHC: 34.2 g/dL (ref 30.0–36.0)
MCV: 90.3 fL (ref 80.0–100.0)
Monocytes Absolute: 0.8 10*3/uL (ref 0.1–1.0)
Monocytes Relative: 9 %
Neutro Abs: 7 10*3/uL (ref 1.7–7.7)
Neutrophils Relative %: 77 %
Platelets: 351 10*3/uL (ref 150–400)
RBC: 4.76 MIL/uL (ref 3.87–5.11)
RDW: 13.3 % (ref 11.5–15.5)
WBC: 9.1 10*3/uL (ref 4.0–10.5)
nRBC: 0 % (ref 0.0–0.2)

## 2019-12-01 LAB — COMPREHENSIVE METABOLIC PANEL
ALT: 18 U/L (ref 0–44)
AST: 18 U/L (ref 15–41)
Albumin: 4 g/dL (ref 3.5–5.0)
Alkaline Phosphatase: 87 U/L (ref 38–126)
Anion gap: 9 (ref 5–15)
BUN: 20 mg/dL (ref 8–23)
CO2: 27 mmol/L (ref 22–32)
Calcium: 9.6 mg/dL (ref 8.9–10.3)
Chloride: 105 mmol/L (ref 98–111)
Creatinine, Ser: 0.99 mg/dL (ref 0.44–1.00)
GFR calc Af Amer: >60 mL/min (ref >60)
GFR calc non Af Amer: 57 mL/min — ABNORMAL LOW (ref >60)
Glucose, Bld: 100 mg/dL — ABNORMAL HIGH (ref 70–99)
Potassium: 3.6 mmol/L (ref 3.5–5.1)
Sodium: 141 mmol/L (ref 135–145)
Total Bilirubin: 0.7 mg/dL (ref 0.3–1.2)
Total Protein: 7.4 g/dL (ref 6.5–8.1)

## 2019-12-01 LAB — LACTATE DEHYDROGENASE: LDH: 169 U/L (ref 98–192)

## 2019-12-01 NOTE — Progress Notes (Signed)
Hematology/Oncology Consult note Susan Mcmillan Telephone:([phone] [phone]   Patient Care Team: Marinda Elk, MD as PCP - General (Physician Assistant) Wellington Hampshire, MD as PCP - Cardiology (Cardiology)  REFERRING PROVIDER: Marinda Elk, MD  REASON FOR VISIT:  Follow up for history of lymphoma  HISTORY OF PRESENTING ILLNESS:  Susan Mcmillan is a  74 y.o.  female with PMH listed below who was referred to me for evaluation of lymphoma  Patient previously followed up with [facility] oncology. Extensive medical record, image, pathology report review via care everywhere were performed by me. Patient initially presented with intermittent right arm/neck tingling, weakness dating back to 2016.  Initial brain imaging demonstrated a left side dural based lesion which was not most consistent with meningioma.  This was monitored.  In the fall 2018, these tingling episodes worsened in frequency and severity, going down on her trunk into her right leg.  Patient saw her primary care physician who performed another MRI which showed larger dural based lesion.  Patient was evaluated by Dr. Lacinda Axon who took her to the [place] on 03/27/2017 where she underwent a subtotal resection.  She tolerated surgery well. 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 No evidence of MALT1 (18q21) rearrangement   further staging included a PET scan and the bone marrow biopsy were negative. Patient finished adjuvant radiation in February 2019.  She followed up with [facility] oncology Dr. Wynetta Emery who recommends surveillance with MRI.  Patient was last seen by Dr. Wynetta Emery on 12/04/2017 and at that time she was recommended to repeat MRI in 4 months and follow-up in the clinic. Patient reports that he was never called for an appointment lost follow-up since  then.  Patient reports doing well currently.  Not taking any antiseizure medications.  Patient was seen by primary care physician and would like to be referred to cancer center for further management. She reports that chronic right side numbness and tingling/and weakness has been better since his surgery however has not returned to her baseline.  She needs help opening the cap of her water bottle. She reports that sometimes she does not have steady gait.  Otherwise no new complaints. Denies any headache, shortness of breath, cough, unintentional weight loss, fever or chills, night sweats.  She was accompanied by husband today.  She tells me that Reports chronic history of IBS with weight fluctuation.  #Cancer screening, patient smokes couple of cigarettes a day, longstanding smoking history since age of 68.  Has been with lung cancer clinic.   #She drinks 8 ounces of Budweiser every afternoon.  Current someday smoker, a few cigarettes daily. 27.5 pack year smoking   INTERVAL HISTORY Susan Mcmillan is a 74 y.o. female who has above history reviewed by me today presents for follow up visit for history of CNS lymphoma. Problems and complaints are listed below: Patient reports feeling well.  She has chronic balancing issue which is at baseline. She also has right hand tremor.  Denies any fever, chills, night sweats, unintentional weight loss.   Review of Systems  Constitutional: Negative for appetite change, chills, fatigue and fever.  HENT:   Negative for hearing loss and voice change.   Eyes: Negative for eye problems.  Respiratory: Negative for chest tightness and cough.   Cardiovascular: Negative for chest pain.  Gastrointestinal: Negative for abdominal distention, abdominal pain and blood in stool.  Endocrine: Negative  for hot flashes.  Genitourinary: Negative for difficulty urinating and frequency.   Musculoskeletal: Negative for arthralgias.       Chronic right-sided  weakness/tingling tremor  Skin: Negative for itching and rash.  Neurological: Negative for extremity weakness, headaches, seizures and speech difficulty.  Hematological: Negative for adenopathy.  Psychiatric/Behavioral: Negative for confusion.    MEDICAL HISTORY:  Past Medical History:  Diagnosis Date  . Anxiety   . CAD (coronary artery disease)    a. 11/2015 MV: mod mid-distal ant ischemia, EF 58% w/ apical HK;  b. 11/2015 Cath: LM nl, LAD 80/170m(fills vial collats from RPDA & D1), D1 60ost, LCX small, nl, RCA 50p/m, RPDA nl, RPAV min irregs, RPl1/2/3 min irregs, EF 50-55%.  . [place] of brain ([place] 03[place]  . Dental bridge present    bilateral top.  Permanent retainer - bottom.  . Dysuria   . History of kidney stones   . Hyperlipemia   . Hypertension   . Hypertensive heart disease   . IBS (irritable bowel syndrome)   . Myocardial infarction ([place]    SILENT  . Pernicious anemia   . Personal history of radiation therapy   . Personal history of tobacco use, presenting hazards to health 07/10/2015  . 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 Skin cancer   . Tobacco abuse    a. Still smoking ~ 1 cigarette/day.  .Marland KitchenUrethral caruncle   . Vertigo   . Vulvar adhesions 10/06/2013   perianal skin bridge noted at posterior fourchette and region of WLE surgical site  . Vulvar leukoplakia   . Vulvar pain     SURGICAL HISTORY: Past Surgical History:  Procedure Laterality Date  . ABDOMINAL HERNIA REPAIR  2014   Dr. WRochel Brome . ABDOMINAL HYSTERECTOMY  1978  . APPENDECTOMY    . BRAIN SURGERY    . CARDIAC CATHETERIZATION Left 12/07/2015   Procedure: Left Heart Cath and Coronary Angiography;  Surgeon: MWellington Hampshire MD;  [facility];  Service: Cardiovascular;  Laterality: Left;  . CATARACT EXTRACTION W/PHACO Right 05/28/2018   Procedure: CATARACT EXTRACTION PHACO AND INTRAOCULAR LENS PLACEMENT ([place];  Surgeon: HMarchia Meiers MD;  Location:  [facility];  Service: Ophthalmology;  Laterality: Right;  [place]:56 CDE 7.17 Fluid pack Lot # [id number]  . CATARACT EXTRACTION W/PHACO Left 01/06/2019   Procedure: CATARACT EXTRACTION PHACO AND INTRAOCULAR LENS PLACEMENT ([facility]) LEFT  00:50.3  19.3%  9.71;  Surgeon: BLeandrew Koyanagi MD;  [facility]  Service: Ophthalmology;  Laterality: Left;  . CERVICAL BIOPSY     Dr. DEnzo Bi . CHOLECYSTECTOMY    . COLONOSCOPY WITH PROPOFOL N/A 02/09/2019   Procedure: COLONOSCOPY WITH PROPOFOL;  Surgeon: SLollie Sails MD;  [facility];  Service: Endoscopy;  Laterality: N/A;  . ESOPHAGOGASTRODUODENOSCOPY (EGD) WITH PROPOFOL N/A 02/09/2019   Procedure: ESOPHAGOGASTRODUODENOSCOPY (EGD) WITH PROPOFOL;  Surgeon: SLollie Sails MD;  [facility];  Service: Endoscopy;  Laterality: N/A;  . SKIN BIOPSY    . wide local excision  07/2013   vin 3 w/ margins involoved    SOCIAL HISTORY: Social History   Socioeconomic History  . Marital status: Married    Spouse name: Not on file  . Number of children: Not on file  . Years of education: Not on file  . Highest education level: Not on file  Occupational History  . Occupation: retired  Tobacco Use  . Smoking status: Current  Some Day Smoker    Packs/day: 0.25    Years: 55.00    Pack years: 13.75    Types: Cigarettes    Last attempt to quit: 12/07/2015    Years since quitting: 3.9  . Smokeless tobacco: Never Used  . Tobacco comment: smokes about 2 cigarettes a day  Vaping Use  . Vaping Use: Never used  Substance and Sexual Activity  . Alcohol use: Yes    Alcohol/week: 0.0 standard drinks    Comment: 8 oz Budweiser occassionally  . Drug use: No  . Sexual activity: Not Currently  Other Topics Concern  . Not on file  Social History Narrative   Lives in [place]. Previously worked [employer]. Husband with dementia. Children 2 sons.   Social Determinants of Health   Financial Resource Strain:   .  Difficulty of Paying Living Expenses:   Food Insecurity:   . Worried About Charity fundraiser in the Last Year:   . Arboriculturist in the Last Year:   Transportation Needs:   . Film/video editor (Medical):   Marland Kitchen Lack of Transportation (Non-Medical):   Physical Activity:   . Days of Exercise per Week:   . Minutes of Exercise per Session:   Stress:   . Feeling of Stress :   Social Connections:   . Frequency of Communication with Friends and Family:   . Frequency of Social Gatherings with Friends and Family:   . Attends Religious Services:   . Active Member of Clubs or Organizations:   . Attends Archivist Meetings:   Marland Kitchen Marital Status:   Intimate Partner Violence:   . Fear of Current or Ex-Partner:   . Emotionally Abused:   Marland Kitchen Physically Abused:   . Sexually Abused:     FAMILY HISTORY: Family History  Problem Relation Age of Onset  . Hyperlipidemia Sister   . Hypertension Sister   . Macular degeneration Sister   . Dementia Sister   . Congestive Heart Failure Mother   . Bone cancer Father   . Cancer Father        blood cancer  . Diabetes Neg Hx   . Heart disease Neg Hx     ALLERGIES:  is allergic to ace inhibitors, codeine, demerol [meperidine], erythromycin, iodinated diagnostic agents, other, and penicillins.  MEDICATIONS:  Current Outpatient Medications  Medication Sig Dispense Refill  . ALPRAZolam (XANAX) 0.25 MG tablet Take 2 tablets (0.5 mg total) by mouth at bedtime as needed. (Patient taking differently: Take 0.25 mg by mouth at bedtime as needed for sleep. ) 60 tablet 0  . atorvastatin (LIPITOR) 80 MG tablet Take 1 tablet (80 mg total) by mouth daily. 90 tablet 3  . Cholecalciferol (VITAMIN D3 PO) Take 1 tablet by mouth daily.    . Cyanocobalamin (VITAMIN B-12 PO) Take 1 tablet by mouth daily.    . diphenhydrAMINE (BENADRYL) 25 MG tablet Take 25 mg by mouth at bedtime.    . fluoruracil (CARAC) 0.5 % cream Apply 1 application topically 2 (two)  times daily as needed. + CALCIPOTRIENE    . FLUoxetine (PROZAC) 20 MG capsule TAKE 1 CAPSULE BY MOUTH EVERY DAY (Patient taking differently: Take 20 mg by mouth daily. ) 90 capsule 3  . folic acid (FOLVITE) 876 MCG tablet Take 800 mcg by mouth daily.    Marland Kitchen LINZESS 72 MCG capsule prn    . metoprolol succinate (TOPROL-XL) 25 MG 24 hr tablet Take 1 tablet (25 mg total) by  mouth daily. 90 tablet 2  . pantoprazole (PROTONIX) 40 MG tablet Take 40 mg by mouth 2 (two) times daily.     . Potassium 99 MG TABS daily. Take 1 tablet by mouth    . tetrahydrozoline 0.05 % ophthalmic solution Place 1 drop into both eyes 3 (three) times daily as needed (dry eyes).    . triamterene-hydrochlorothiazide (MAXZIDE-25) 37.5-25 MG tablet TAKE 1/2 TAB BY MOUTH DAILY (Patient taking differently: Take 0.5 tablets by mouth daily. ) 90 tablet 1   No current facility-administered medications for this visit.     PHYSICAL EXAMINATION: ECOG PERFORMANCE STATUS: 1 - Symptomatic but completely ambulatory Vitals:   12/01/19 1323  BP: 123/82  Pulse: (!) 56  Resp: 16  Temp: (!) 96.7 F (35.9 C)   Filed Weights   12/01/19 1323  Weight: 158 lb 1.6 oz (71.7 kg)    Physical Exam Constitutional:      General: She is not in acute distress. HENT:     Head: Normocephalic and atraumatic.  Eyes:     General: No scleral icterus.    Pupils: Pupils are equal, round, and reactive to light.  Cardiovascular:     Rate and Rhythm: Normal rate and regular rhythm.     Heart sounds: Normal heart sounds.  Pulmonary:     Effort: Pulmonary effort is normal. No respiratory distress.     Breath sounds: No wheezing.     Comments: Decreased breath sound bilaterally Abdominal:     General: Bowel sounds are normal. There is no distension.     Palpations: Abdomen is soft. There is no mass.     Tenderness: There is no abdominal tenderness.  Musculoskeletal:        General: No deformity. Normal range of motion.     Cervical back: Normal  range of motion and neck supple.  Skin:    General: Skin is warm and dry.     Findings: No erythema or rash.  Neurological:     Mental Status: She is alert and oriented to person, place, and time. Mental status is at baseline.     Cranial Nerves: No cranial nerve deficit.     Coordination: Coordination normal.  Psychiatric:        Mood and Affect: Mood normal.     RADIOGRAPHIC STUDIES: I have personally reviewed the radiological images as listed and agreed with the findings in the report.  CMP Latest Ref Rng & Units 12/01/2019  Glucose 70 - 99 mg/dL 100(H)  BUN 8 - 23 mg/dL 20  Creatinine 0.44 - 1.00 mg/dL 0.99  Sodium 135 - 145 mmol/L 141  Potassium 3.5 - 5.1 mmol/L 3.6  Chloride 98 - 111 mmol/L 105  CO2 22 - 32 mmol/L 27  Calcium 8.9 - 10.3 mg/dL 9.6  Total Protein 6.5 - 8.1 g/dL 7.4  Total Bilirubin 0.3 - 1.2 mg/dL 0.7  Alkaline Phos 38 - 126 U/L 87  AST 15 - 41 U/L 18  ALT 0 - 44 U/L 18   CBC Latest Ref Rng & Units 12/01/2019  WBC 4.0 - 10.5 K/uL 9.1  Hemoglobin 12.0 - 15.0 g/dL 14.7  Hematocrit 36 - 46 % 43.0  Platelets 150 - 400 K/uL 351    LABORATORY DATA:  I have reviewed the data as listed Lab Results  Component Value Date   WBC 9.1 12/01/2019   HGB 14.7 12/01/2019   HCT 43.0 12/01/2019   MCV 90.3 12/01/2019   PLT 351 12/01/2019  Recent Labs    02/23/19 1750 06/02/19 1312 12/01/19 1302  NA 137 139 141  K 2.9* 3.5 3.6  CL 98 103 105  CO2 _0 GLUCOSE 103* 92 100*  BUN _1 CREATININE 0.72 0.91 0.99  CALCIUM 9.3 9.4 9.6  GFRNONAA >60 >60 57*  GFRAA >60 >60 >60  PROT 7.6 7.7 7.4  ALBUMIN 3.9 4.2 4.0  AST _2 ALT _3 ALKPHOS 83 96 87  BILITOT 0.7 0.7 0.7   Iron/TIBC/Ferritin/ %Sat    Component Value Date/Time   FERRITIN 24.0 01/14/2012 1434    03/27/2017 Brain tumor biopsies showed mature B cell lymphoma with plasmacytic differentiation.  Favoring marginal zone lymphoma  RADIOGRAPHIC STUDIES: I have personally  reviewed the radiological images as listed and agreed with the findings in the report. 04/02/2018 bilateral screening mammogram-BI-RADS 1 12/04/2017 MRI brain with and without contrast Equivalent and nonspecific increased cranial bone enhancement at the craniotomy site compared to 07/31/2017.  Small left frontal subdural collection subjacent to craniotomy site has almost completely resolved.  Stable small subdural enhancement subjacent to the craniotomy site.  No obvious new intra-axial abnormality. 07/31/2017 MRI with and without contrast 01/17/2017 MRI brain with and without contrast Interval resection of the dural based enhancing mass on the left frontal lobe, residual same linear dural enhancement seen along the left anterior and parietal lobes, could represent residual lymphoma.  Contacted postsurgical changes with small subdural collection along the left frontal lobe. Extra axial enhancing mass overlying the left hemisphere measuring up to 12 mm in thickness compatible with meningioma.  This shows significant enlargement since 2016.  There is mass-effect on the overlying cortex but no shift of the midline structures.  No brain edema.  08/31/2018 MRI brain with and without contrast showed status post resection of extra axial left convexity tumor without residual lesion.  Otherwise normal brain MRI.  ASSESSMENT & PLAN:  1. History of lymphoma   2. Tobacco abuse   3. Tremor    Labs reviewed and discussed with patient Clinically she is doing very well at baseline. No new symptoms Surveillance brain MRI was independently reviewed by me and discussed with patient. No evidence of recurrence of lymphoma. Counts are stable.  Normal LDH. This is close to 2 years post lymphoma surgery.  I recommend patient to repeat MRI brain in 1 year.   #Tobacco use, smoke cessation discussed with patient.  Patient has had lung cancer screening testing done.  Images were independently reviewed by me and discussed  with patient.  Continue follow-up with lung cancer screening program. #Tremor, right hand.  Possible essential tremor.  Continue to monitor.  Orders Placed This Encounter  Procedures  . MR Brain W Wo Contrast    Standing Status:   Future    Standing Expiration Date:   11/30/2020    Order Specific Question:   If indicated for the ordered procedure, I authorize the administration of contrast media per Radiology protocol    Answer:   Yes    Order Specific Question:   What is the patient's sedation requirement?    Answer:   No Sedation    Order Specific Question:   Does the patient have a pacemaker or implanted devices?    Answer:   No    Order Specific Question:   Use SRS Protocol?    Answer:   Yes    Order Specific Question:   Radiology Contrast Protocol - do  NOT remove file path    Answer:   [email or web address]    Order Specific Question:   Preferred imaging location?    Answer:   [facility] (table limit - 550lbs)    All questions were answered. The patient knows to call the clinic with any problems questions or concerns.  Return of visit: 12 months.   Earlie Server, MD, PhD Hematology Oncology [place] at [facility] 12/01/2019

## 2019-12-13 DIAGNOSIS — R7303 Prediabetes: Secondary | ICD-10-CM | POA: Diagnosis not present

## 2019-12-13 DIAGNOSIS — I10 Essential (primary) hypertension: Secondary | ICD-10-CM | POA: Diagnosis not present

## 2019-12-13 DIAGNOSIS — E782 Mixed hyperlipidemia: Secondary | ICD-10-CM | POA: Diagnosis not present

## 2019-12-20 DIAGNOSIS — E782 Mixed hyperlipidemia: Secondary | ICD-10-CM | POA: Diagnosis not present

## 2019-12-20 DIAGNOSIS — I10 Essential (primary) hypertension: Secondary | ICD-10-CM | POA: Diagnosis not present

## 2019-12-20 DIAGNOSIS — C8510 Unspecified B-cell lymphoma, unspecified site: Secondary | ICD-10-CM | POA: Diagnosis not present

## 2019-12-20 DIAGNOSIS — I2583 Coronary atherosclerosis due to lipid rich plaque: Secondary | ICD-10-CM | POA: Diagnosis not present

## 2019-12-20 DIAGNOSIS — R7303 Prediabetes: Secondary | ICD-10-CM | POA: Diagnosis not present

## 2019-12-20 DIAGNOSIS — R2689 Other abnormalities of gait and mobility: Secondary | ICD-10-CM | POA: Diagnosis not present

## 2019-12-20 DIAGNOSIS — I2510 Atherosclerotic heart disease of native coronary artery without angina pectoris: Secondary | ICD-10-CM | POA: Diagnosis not present

## 2019-12-23 DIAGNOSIS — M5136 Other intervertebral disc degeneration, lumbar region: Secondary | ICD-10-CM | POA: Diagnosis not present

## 2019-12-23 DIAGNOSIS — M5416 Radiculopathy, lumbar region: Secondary | ICD-10-CM | POA: Diagnosis not present

## 2020-02-17 ENCOUNTER — Encounter: Payer: Self-pay | Admitting: Cardiovascular Disease

## 2020-02-17 ENCOUNTER — Other Ambulatory Visit: Payer: Self-pay

## 2020-02-17 ENCOUNTER — Ambulatory Visit: Payer: PPO | Admitting: Cardiovascular Disease

## 2020-02-17 VITALS — BP 120/90 | HR 76 | Ht 62.0 in | Wt 160.5 lb

## 2020-02-17 DIAGNOSIS — Z720 Tobacco use: Secondary | ICD-10-CM

## 2020-02-17 DIAGNOSIS — I10 Essential (primary) hypertension: Secondary | ICD-10-CM | POA: Diagnosis not present

## 2020-02-17 DIAGNOSIS — I2510 Atherosclerotic heart disease of native coronary artery without angina pectoris: Secondary | ICD-10-CM | POA: Diagnosis not present

## 2020-02-17 DIAGNOSIS — E785 Hyperlipidemia, unspecified: Secondary | ICD-10-CM | POA: Diagnosis not present

## 2020-02-17 NOTE — Progress Notes (Signed)
Cardiology Office Note   Date:  02/17/2020   ID:  Susan, Mcmillan 28-Jan-1946, MRN [id number]  PCP:  Marinda Elk, MD  Cardiologist:   Kathlyn Sacramento, MD   Chief Complaint  Patient presents with  . office visit    6 month F/U; Meds verbally reviewed with patient.      History of Present Illness: Susan Mcmillan is a 74 y.o. female who Is here today for a follow-up visit regarding coronary artery disease. 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  She was found to have significant coronary artery atherosclerosis and calcifications on previous CT scan in 2017. Nuclear stress test showed moderate mid to distal anterior wall ischemia with normal ejection fraction.  Cardiac catheterization showed occluded mid LAD with right to left collaterals and left to left collaterals.  She otherwise had moderate RCA disease.   She has been treated medically since then.   She has been doing reasonably well with no chest pain or worsening dyspnea. No palpitations. She smokes 1 or 2 cigarettes a day. She reports improvement in dizziness. Carotid Doppler in December 2020 showed no significant carotid disease.    Past Medical History:  Diagnosis Date  . Anxiety   . CAD (coronary artery disease)    a. 11/2015 MV: mod mid-distal ant ischemia, EF 58% w/ apical HK;  b. 11/2015 Cath: LM nl, LAD 80/115m (fills vial collats from RPDA & D1), D1 60ost, LCX small, nl, RCA 50p/m, RPDA nl, RPAV min irregs, RPl1/2/3 min irregs, EF 50-55%.  . Cancer of brain ([place]) 03/2017  . Dental bridge present    bilateral top.  Permanent retainer - bottom.  . Dysuria   . History of kidney stones   . Hyperlipemia   . Hypertension   . Hypertensive heart disease   . IBS (irritable bowel syndrome)   .  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 Pernicious anemia   . Personal history of radiation therapy   . Personal history of tobacco use, presenting hazards to health 07/10/2015  . 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 Skin cancer   . Tobacco abuse    a. Still smoking ~ 1 cigarette/day.  Marland Kitchen Urethral caruncle   . Vertigo   . Vulvar adhesions 10/06/2013   perianal skin bridge noted at posterior fourchette and region of WLE surgical site  . Vulvar leukoplakia   . Vulvar pain     Past Surgical History:  Procedure Laterality Date  . ABDOMINAL HERNIA REPAIR  2014   Dr. Rochel Brome  . ABDOMINAL HYSTERECTOMY  1978  . APPENDECTOMY    . BRAIN SURGERY    . CARDIAC CATHETERIZATION Left 12/07/2015   Procedure: Left Heart Cath and Coronary Angiography;  Surgeon: Wellington Hampshire, MD;  Location: [place] [facility];  Service: Cardiovascular;  Laterality: Left;  . CATARACT EXTRACTION W/PHACO Right 05/28/2018   Procedure: CATARACT EXTRACTION PHACO AND INTRAOCULAR LENS PLACEMENT ([place]) RIGHT;  Surgeon: Marchia Meiers, MD;  Location: [facility];  Service: Ophthalmology;  Laterality: Right;  [place] 00:56 CDE 7.17 Fluid pack Lot # [id number]  . CATARACT EXTRACTION W/PHACO Left 01/06/2019   Procedure: CATARACT EXTRACTION PHACO AND INTRAOCULAR LENS PLACEMENT ([facility]) LEFT  00:50.3  19.3%  9.71;  Surgeon: Wallace Going,  Nila Nephew, MD;  Location: [place];  Service: Ophthalmology;  Laterality: Left;  . CERVICAL BIOPSY     Dr. Enzo Bi  . CHOLECYSTECTOMY    . COLONOSCOPY WITH PROPOFOL N/A 02/09/2019   Procedure: COLONOSCOPY WITH PROPOFOL;  Surgeon: Lollie Sails, MD;  Location: [facility];  Service: Endoscopy;  Laterality: N/A;  . ESOPHAGOGASTRODUODENOSCOPY (EGD) WITH PROPOFOL N/A 02/09/2019   Procedure: ESOPHAGOGASTRODUODENOSCOPY (EGD) WITH PROPOFOL;  Surgeon: Lollie Sails, MD;  Location: [facility] ENDOSCOPY;  Service: Endoscopy;  Laterality: N/A;  . SKIN BIOPSY    . wide local  excision  07/2013   vin 3 w/ margins involoved     Current Outpatient Medications  Medication Sig Dispense Refill  . ALPRAZolam (XANAX) 0.25 MG tablet Take 2 tablets (0.5 mg total) by mouth at bedtime as needed. (Patient taking differently: Take 0.25 mg by mouth at bedtime as needed for sleep. ) 60 tablet 0  . atorvastatin (LIPITOR) 80 MG tablet Take 1 tablet (80 mg total) by mouth daily. 90 tablet 3  . Cholecalciferol (VITAMIN D3 PO) Take 1 tablet by mouth daily.    . Cyanocobalamin (VITAMIN B-12 PO) Take 1 tablet by mouth daily.    . diphenhydrAMINE (BENADRYL) 25 MG tablet Take 25 mg by mouth at bedtime as needed.    . fluoruracil (CARAC) 0.5 % cream Apply 1 application topically 2 (two) times daily as needed. + CALCIPOTRIENE    . FLUoxetine (PROZAC) 20 MG capsule Take 20 mg by mouth daily.    . folic acid (FOLVITE) 144 MCG tablet Take 800 mcg by mouth daily.    Marland Kitchen LINZESS 72 MCG capsule prn    . metoprolol succinate (TOPROL-XL) 25 MG 24 hr tablet Take 1 tablet (25 mg total) by mouth daily. 90 tablet 2  . pantoprazole (PROTONIX) 40 MG tablet Take 40 mg by mouth 2 (two) times daily.     . Potassium 99 MG TABS daily. Take 1 tablet by mouth    . tetrahydrozoline 0.05 % ophthalmic solution Place 1 drop into both eyes 3 (three) times daily as needed (dry eyes).    . triamterene-hydrochlorothiazide (MAXZIDE-25) 37.5-25 MG tablet Take 0.5 tablets by mouth daily.    . diphenhydrAMINE (BENADRYL) 25 MG tablet Take 25 mg by mouth at bedtime.    Marland Kitchen FLUoxetine (PROZAC) 20 MG capsule TAKE 1 CAPSULE BY MOUTH EVERY DAY (Patient taking differently: Take 20 mg by mouth daily. ) 90 capsule 3  . triamterene-hydrochlorothiazide (MAXZIDE-25) 37.5-25 MG tablet TAKE 1/2 TAB BY MOUTH DAILY (Patient taking differently: Take 0.5 tablets by mouth daily. ) 90 tablet 1   No current facility-administered medications for this visit.    Allergies:   Ace inhibitors, Codeine, Demerol [meperidine], Erythromycin, Iodinated  diagnostic agents, Other, and Penicillins    Social History:  The patient  reports that she has been smoking cigarettes. She has a 13.75 pack-year smoking history. She has never used smokeless tobacco. She reports current alcohol use. She reports that she does not use drugs.   Family History:  The patient's family history includes Bone cancer in her father; Cancer in her father; Congestive Heart Failure in her mother; Dementia in her sister; Hyperlipidemia in her sister; Hypertension in her sister; Macular degeneration in her sister.    ROS:  Please see the history of present illness.   Otherwise, review of systems are positive for none.   All other systems are reviewed and negative.    PHYSICAL EXAM: VS:  BP 120/90 (BP Location:  Left Arm, Patient Position: Sitting, Cuff Size: Normal)   Pulse 76   Ht 5\' 2"  (1.575 m)   Wt 160 lb 8 oz (72.8 kg)   SpO2 96%   BMI 29.36 kg/m  , BMI Body mass index is 29.36 kg/m. GEN: Well nourished, well developed, in no acute distress  HEENT: normal  Neck: no JVD, carotid bruits, or masses Cardiac: RRR; no murmurs, rubs, or gallops,no edema  Respiratory:  clear to auscultation bilaterally, normal work of breathing GI: soft, nontender, nondistended, + BS MS: no deformity or atrophy  Skin: warm and dry, no rash Neuro:  Strength and sensation are intact Psych: euthymic mood, full affect Posterior tibial pulses palpable bilaterally.  EKG:  EKG is ordered today. The ekg ordered today demonstrates normal sinus rhythm with sinus arrhythmia. Nonspecific anterior T wave changes.   Recent Labs: 12/01/2019: ALT 18; BUN 20; Creatinine, Ser 0.99; Hemoglobin 14.7; Platelets 351; Potassium 3.6; Sodium 141    Lipid Panel    Component Value Date/Time   CHOL 149 02/06/2016 0953   TRIG 155 (H) 02/06/2016 0953   HDL 54 02/06/2016 0953   CHOLHDL 2.8 02/06/2016 0953   CHOLHDL 4 05/17/2015 1442   VLDL 57.6 (H) 05/17/2015 1442   LDLCALC 64 02/06/2016 0953    LDLDIRECT 88.0 05/17/2015 1442      Wt Readings from Last 3 Encounters:  02/17/20 160 lb 8 oz (72.8 kg)  12/01/19 158 lb 1.6 oz (71.7 kg)  11/04/19 157 lb (71.2 kg)       ASSESSMENT AND PLAN:  1.  Coronary artery disease involving native coronary arteries without angina: She is doing well overall with no anginal symptoms.  Continue medical therapy with aspirin, metoprolol and atorvastatin.  2.  Essential hypertension: Repeat blood pressure was 122/80. Continue Toprol and Maxide.  3.  Hyperlipidemia: Continue high-dose atorvastatin. I reviewed most recent lipid profile in August which showed an LDL of 66.  4.  Tobacco use: She cut down smoking to 2 cigarettes a day.    Disposition:   FU with me in 6 months.  Signed,  Kathlyn Sacramento, MD  02/17/2020 2:49 PM    [place]

## 2020-02-17 NOTE — Patient Instructions (Signed)

## 2020-02-23 DIAGNOSIS — M5441 Lumbago with sciatica, right side: Secondary | ICD-10-CM | POA: Diagnosis not present

## 2020-02-23 DIAGNOSIS — M7061 Trochanteric bursitis, right hip: Secondary | ICD-10-CM | POA: Diagnosis not present

## 2020-02-23 DIAGNOSIS — M5416 Radiculopathy, lumbar region: Secondary | ICD-10-CM | POA: Diagnosis not present

## 2020-02-23 DIAGNOSIS — M5442 Lumbago with sciatica, left side: Secondary | ICD-10-CM | POA: Diagnosis not present

## 2020-02-23 DIAGNOSIS — M5136 Other intervertebral disc degeneration, lumbar region: Secondary | ICD-10-CM | POA: Diagnosis not present

## 2020-02-23 DIAGNOSIS — M7062 Trochanteric bursitis, left hip: Secondary | ICD-10-CM | POA: Diagnosis not present

## 2020-03-16 DIAGNOSIS — M5416 Radiculopathy, lumbar region: Secondary | ICD-10-CM | POA: Diagnosis not present

## 2020-03-16 DIAGNOSIS — M5136 Other intervertebral disc degeneration, lumbar region: Secondary | ICD-10-CM | POA: Diagnosis not present

## 2020-03-22 DIAGNOSIS — I10 Essential (primary) hypertension: Secondary | ICD-10-CM | POA: Diagnosis not present

## 2020-03-22 DIAGNOSIS — E782 Mixed hyperlipidemia: Secondary | ICD-10-CM | POA: Diagnosis not present

## 2020-03-22 DIAGNOSIS — R7303 Prediabetes: Secondary | ICD-10-CM | POA: Diagnosis not present

## 2020-03-27 ENCOUNTER — Other Ambulatory Visit: Payer: Self-pay | Admitting: Physician Assistant

## 2020-03-27 DIAGNOSIS — E785 Hyperlipidemia, unspecified: Secondary | ICD-10-CM

## 2020-03-29 DIAGNOSIS — Z0000 Encounter for general adult medical examination without abnormal findings: Secondary | ICD-10-CM | POA: Diagnosis not present

## 2020-03-29 DIAGNOSIS — M5416 Radiculopathy, lumbar region: Secondary | ICD-10-CM | POA: Diagnosis not present

## 2020-03-29 DIAGNOSIS — E782 Mixed hyperlipidemia: Secondary | ICD-10-CM | POA: Diagnosis not present

## 2020-03-29 DIAGNOSIS — I2583 Coronary atherosclerosis due to lipid rich plaque: Secondary | ICD-10-CM | POA: Diagnosis not present

## 2020-03-29 DIAGNOSIS — I10 Essential (primary) hypertension: Secondary | ICD-10-CM | POA: Diagnosis not present

## 2020-03-29 DIAGNOSIS — R300 Dysuria: Secondary | ICD-10-CM | POA: Diagnosis not present

## 2020-03-29 DIAGNOSIS — D0471 Carcinoma in situ of skin of right lower limb, including hip: Secondary | ICD-10-CM | POA: Diagnosis not present

## 2020-03-29 DIAGNOSIS — I2510 Atherosclerotic heart disease of native coronary artery without angina pectoris: Secondary | ICD-10-CM | POA: Diagnosis not present

## 2020-03-29 DIAGNOSIS — R7303 Prediabetes: Secondary | ICD-10-CM | POA: Diagnosis not present

## 2020-03-29 DIAGNOSIS — C8510 Unspecified B-cell lymphoma, unspecified site: Secondary | ICD-10-CM | POA: Diagnosis not present

## 2020-04-14 DIAGNOSIS — X32XXXA Exposure to sunlight, initial encounter: Secondary | ICD-10-CM | POA: Diagnosis not present

## 2020-04-14 DIAGNOSIS — L821 Other seborrheic keratosis: Secondary | ICD-10-CM | POA: Diagnosis not present

## 2020-04-14 DIAGNOSIS — L570 Actinic keratosis: Secondary | ICD-10-CM | POA: Diagnosis not present

## 2020-04-14 DIAGNOSIS — L720 Epidermal cyst: Secondary | ICD-10-CM | POA: Diagnosis not present

## 2020-04-14 DIAGNOSIS — C44629 Squamous cell carcinoma of skin of left upper limb, including shoulder: Secondary | ICD-10-CM | POA: Diagnosis not present

## 2020-04-14 DIAGNOSIS — D485 Neoplasm of uncertain behavior of skin: Secondary | ICD-10-CM | POA: Diagnosis not present

## 2020-05-16 DIAGNOSIS — J209 Acute bronchitis, unspecified: Secondary | ICD-10-CM | POA: Diagnosis not present

## 2020-05-16 DIAGNOSIS — R6889 Other general symptoms and signs: Secondary | ICD-10-CM | POA: Diagnosis not present

## 2020-05-24 DIAGNOSIS — R059 Cough, unspecified: Secondary | ICD-10-CM | POA: Diagnosis not present

## 2020-05-24 DIAGNOSIS — R053 Chronic cough: Secondary | ICD-10-CM | POA: Diagnosis not present

## 2020-05-24 DIAGNOSIS — R0781 Pleurodynia: Secondary | ICD-10-CM | POA: Diagnosis not present

## 2020-05-24 DIAGNOSIS — J209 Acute bronchitis, unspecified: Secondary | ICD-10-CM | POA: Diagnosis not present

## 2020-05-30 ENCOUNTER — Other Ambulatory Visit: Payer: Self-pay | Admitting: Gastroenterology

## 2020-05-30 DIAGNOSIS — R140 Abdominal distension (gaseous): Secondary | ICD-10-CM | POA: Diagnosis not present

## 2020-05-30 DIAGNOSIS — K582 Mixed irritable bowel syndrome: Secondary | ICD-10-CM | POA: Diagnosis not present

## 2020-05-30 DIAGNOSIS — K581 Irritable bowel syndrome with constipation: Secondary | ICD-10-CM | POA: Diagnosis not present

## 2020-05-30 DIAGNOSIS — K219 Gastro-esophageal reflux disease without esophagitis: Secondary | ICD-10-CM | POA: Diagnosis not present

## 2020-05-31 DIAGNOSIS — M5416 Radiculopathy, lumbar region: Secondary | ICD-10-CM | POA: Diagnosis not present

## 2020-05-31 DIAGNOSIS — M7062 Trochanteric bursitis, left hip: Secondary | ICD-10-CM | POA: Diagnosis not present

## 2020-05-31 DIAGNOSIS — M7061 Trochanteric bursitis, right hip: Secondary | ICD-10-CM | POA: Diagnosis not present

## 2020-05-31 DIAGNOSIS — M5136 Other intervertebral disc degeneration, lumbar region: Secondary | ICD-10-CM | POA: Diagnosis not present

## 2020-06-12 ENCOUNTER — Other Ambulatory Visit: Payer: Self-pay

## 2020-06-12 ENCOUNTER — Ambulatory Visit
Admission: RE | Admit: 2020-06-12 | Discharge: 2020-06-12 | Disposition: A | Discharge status: Home / Self Care | Payer: PPO | Source: Ambulatory Visit | Attending: Gastroenterology | Admitting: Gastroenterology

## 2020-06-12 DIAGNOSIS — I700 Atherosclerosis of aorta: Secondary | ICD-10-CM | POA: Insufficient documentation

## 2020-06-12 DIAGNOSIS — D3501 Benign neoplasm of right adrenal gland: Secondary | ICD-10-CM | POA: Diagnosis not present

## 2020-06-12 DIAGNOSIS — R140 Abdominal distension (gaseous): Secondary | ICD-10-CM | POA: Diagnosis not present

## 2020-06-12 DIAGNOSIS — R110 Nausea: Secondary | ICD-10-CM | POA: Diagnosis not present

## 2020-06-12 DIAGNOSIS — D3500 Benign neoplasm of unspecified adrenal gland: Secondary | ICD-10-CM | POA: Diagnosis not present

## 2020-06-12 DIAGNOSIS — K429 Umbilical hernia without obstruction or gangrene: Secondary | ICD-10-CM | POA: Diagnosis not present

## 2020-06-16 DIAGNOSIS — M5136 Other intervertebral disc degeneration, lumbar region: Secondary | ICD-10-CM | POA: Diagnosis not present

## 2020-06-16 DIAGNOSIS — M5416 Radiculopathy, lumbar region: Secondary | ICD-10-CM | POA: Diagnosis not present

## 2020-06-16 DIAGNOSIS — M48062 Spinal stenosis, lumbar region with neurogenic claudication: Secondary | ICD-10-CM | POA: Diagnosis not present

## 2020-08-15 DIAGNOSIS — K582 Mixed irritable bowel syndrome: Secondary | ICD-10-CM | POA: Diagnosis not present

## 2020-08-15 DIAGNOSIS — K2100 Gastro-esophageal reflux disease with esophagitis, without bleeding: Secondary | ICD-10-CM | POA: Diagnosis not present

## 2020-08-16 DIAGNOSIS — K582 Mixed irritable bowel syndrome: Secondary | ICD-10-CM | POA: Diagnosis not present

## 2020-08-18 DIAGNOSIS — D2272 Melanocytic nevi of left lower limb, including hip: Secondary | ICD-10-CM | POA: Diagnosis not present

## 2020-08-18 DIAGNOSIS — D2271 Melanocytic nevi of right lower limb, including hip: Secondary | ICD-10-CM | POA: Diagnosis not present

## 2020-08-18 DIAGNOSIS — X32XXXA Exposure to sunlight, initial encounter: Secondary | ICD-10-CM | POA: Diagnosis not present

## 2020-08-18 DIAGNOSIS — D485 Neoplasm of uncertain behavior of skin: Secondary | ICD-10-CM | POA: Diagnosis not present

## 2020-08-18 DIAGNOSIS — C44311 Basal cell carcinoma of skin of nose: Secondary | ICD-10-CM | POA: Diagnosis not present

## 2020-08-18 DIAGNOSIS — D225 Melanocytic nevi of trunk: Secondary | ICD-10-CM | POA: Diagnosis not present

## 2020-08-18 DIAGNOSIS — D2261 Melanocytic nevi of right upper limb, including shoulder: Secondary | ICD-10-CM | POA: Diagnosis not present

## 2020-08-18 DIAGNOSIS — C44722 Squamous cell carcinoma of skin of right lower limb, including hip: Secondary | ICD-10-CM | POA: Diagnosis not present

## 2020-08-18 DIAGNOSIS — L570 Actinic keratosis: Secondary | ICD-10-CM | POA: Diagnosis not present

## 2020-08-18 DIAGNOSIS — L821 Other seborrheic keratosis: Secondary | ICD-10-CM | POA: Diagnosis not present

## 2020-08-18 DIAGNOSIS — D2262 Melanocytic nevi of left upper limb, including shoulder: Secondary | ICD-10-CM | POA: Diagnosis not present

## 2020-08-29 ENCOUNTER — Other Ambulatory Visit: Payer: Self-pay

## 2020-08-29 ENCOUNTER — Ambulatory Visit: Payer: PPO | Admitting: Cardiovascular Disease

## 2020-08-29 ENCOUNTER — Encounter: Payer: Self-pay | Admitting: Cardiovascular Disease

## 2020-08-29 VITALS — BP 128/78 | HR 60 | Ht 62.0 in | Wt 162.2 lb

## 2020-08-29 DIAGNOSIS — E785 Hyperlipidemia, unspecified: Secondary | ICD-10-CM

## 2020-08-29 DIAGNOSIS — I2510 Atherosclerotic heart disease of native coronary artery without angina pectoris: Secondary | ICD-10-CM

## 2020-08-29 DIAGNOSIS — R0602 Shortness of breath: Secondary | ICD-10-CM

## 2020-08-29 DIAGNOSIS — I10 Essential (primary) hypertension: Secondary | ICD-10-CM | POA: Diagnosis not present

## 2020-08-29 DIAGNOSIS — Z720 Tobacco use: Secondary | ICD-10-CM | POA: Diagnosis not present

## 2020-08-29 NOTE — Patient Instructions (Addendum)
Medication Instructions:  No changes  *If you need a refill on your cardiac medications before your next appointment, please call your pharmacy*   Lab Work: None   If you have labs (blood work) drawn today and your tests are completely normal, you will receive your results only by: Marland Kitchen MyChart Message (if you have MyChart) OR . A paper copy in the mail If you have any lab test that is abnormal or we need to change your treatment, we will call you to review the results.   Testing/Procedures: Your physician has requested that you have an echocardiogram. Echocardiography is a painless test that uses sound waves to create images of your heart. It provides your doctor with information about the size and shape of your heart and how well your heart's chambers and valves are working. This procedure takes approximately one hour. There are no restrictions for this procedure.    Follow-Up: At [facility], you and your health needs are our priority.  As part of our continuing mission to provide you with exceptional heart care, we have created designated Provider Care Teams.  These Care Teams include your primary Cardiologist (physician) and Advanced Practice Providers (APPs -  Physician Assistants and Nurse Practitioners) who all work together to provide you with the care you need, when you need it.   Your next appointment:   6 month(s)  The format for your next appointment:   In Person  Provider:   You may see Kathlyn Sacramento, MD or one of the following Advanced Practice Providers on your designated Care Team:    Murray Hodgkins, NP  Christell Faith, PA-C  Marrianne Mood, PA-C  Cadence Weedpatch, [place]  Laurann Montana, NP

## 2020-08-29 NOTE — Progress Notes (Signed)
Cardiology Office Note   Date:  08/29/2020   ID:  Susan Mcmillan, DOB Sep 26, 1945, MRN [id number]  PCP:  Susan Elk, MD  Cardiologist:   Susan Sacramento, MD   Chief Complaint  Patient presents with  . Other    6 month f/u no complaints today. Meds reviewed verbally with pt.      History of Present Illness: Susan Mcmillan is a 75 y.o. female who Is here today for a follow-up visit regarding coronary artery disease. 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  She was found to have significant coronary artery atherosclerosis and calcifications on previous CT scan in 2017. Nuclear stress test showed moderate mid to distal anterior wall ischemia with normal ejection fraction.  Cardiac catheterization showed occluded mid LAD with right to left collaterals and left to left collaterals.  She otherwise had moderate RCA disease.   She has been treated medically since then.    Carotid Doppler in December 2020 showed no significant carotid disease.  She continues to smoke 2 cigarettes a day.  She drinks 1 beer a day.  She report recent episode of shortness of breath when she was at the grocery store.  She does have mild exertional dyspnea but no significant chest pain.     Past Medical History:  Diagnosis Date  . Anxiety   . CAD (coronary artery disease)    a. 11/2015 MV: mod mid-distal ant ischemia, EF 58% w/ apical HK;  b. 11/2015 Cath: LM nl, LAD 80/141m (fills vial collats from RPDA & D1), D1 60ost, LCX small, nl, RCA 50p/m, RPDA nl, RPAV min irregs, RPl1/2/3 min irregs, EF 50-55%.  . Cancer of brain ([place]) 03/2017  . Dental bridge present    bilateral top.  Permanent retainer - bottom.  . Dysuria   . History of kidney stones   . Hyperlipemia   . Hypertension   .  Hypertensive heart disease   . IBS (irritable bowel syndrome)   . Myocardial infarction ([place])    SILENT  . Pernicious anemia   . Personal history of radiation therapy   . Personal history of tobacco use, presenting hazards to health 07/10/2015  . 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 Skin cancer   . Tobacco abuse    a. Still smoking ~ 1 cigarette/day.  Marland Kitchen Urethral caruncle   . Vertigo   . Vulvar adhesions 10/06/2013   perianal skin bridge noted at posterior fourchette and region of WLE surgical site  . Vulvar leukoplakia   . Vulvar pain     Past Surgical History:  Procedure Laterality Date  . ABDOMINAL HERNIA REPAIR  2014   Dr. Rochel Brome  . ABDOMINAL HYSTERECTOMY  1978  . APPENDECTOMY    . BRAIN SURGERY    . CARDIAC CATHETERIZATION Left 12/07/2015   Procedure: Left Heart Cath and Coronary Angiography;  Surgeon: Wellington Hampshire, MD;  Location: [place] [facility];  Service: Cardiovascular;  Laterality: Left;  . CATARACT EXTRACTION W/PHACO Right 05/28/2018   Procedure: CATARACT EXTRACTION PHACO AND INTRAOCULAR LENS PLACEMENT ([place]) RIGHT;  Surgeon: Marchia Meiers, MD;  Location: [facility];  Service: Ophthalmology;  Laterality: Right;  [place] 00:56 CDE 7.17 Fluid pack Lot # [id number]  . CATARACT EXTRACTION W/PHACO Left 01/06/2019  Procedure: CATARACT EXTRACTION PHACO AND INTRAOCULAR LENS PLACEMENT ([facility]) LEFT  00:50.3  19.3%  9.71;  Surgeon: Leandrew Koyanagi, MD;  Location: [place];  Service: Ophthalmology;  Laterality: Left;  . CERVICAL BIOPSY     Dr. Enzo Bi  . CHOLECYSTECTOMY    . COLONOSCOPY WITH PROPOFOL N/A 02/09/2019   Procedure: COLONOSCOPY WITH PROPOFOL;  Surgeon: Lollie Sails, MD;  Location: [facility] ENDOSCOPY;  Service: Endoscopy;  Laterality: N/A;  . ESOPHAGOGASTRODUODENOSCOPY (EGD) WITH PROPOFOL N/A 02/09/2019   Procedure: ESOPHAGOGASTRODUODENOSCOPY (EGD) WITH PROPOFOL;  Surgeon: Lollie Sails, MD;  Location: [facility] ENDOSCOPY;   Service: Endoscopy;  Laterality: N/A;  . SKIN BIOPSY    . wide local excision  07/2013   vin 3 w/ margins involoved     Current Outpatient Medications  Medication Sig Dispense Refill  . ALPRAZolam (XANAX) 0.25 MG tablet Take 2 tablets (0.5 mg total) by mouth at bedtime as needed. 60 tablet 0  . atorvastatin (LIPITOR) 80 MG tablet TAKE 1 TABLET(80 MG) BY MOUTH DAILY 90 tablet 1  . Cholecalciferol (VITAMIN D3 PO) Take 1 tablet by mouth daily.    . Cyanocobalamin (VITAMIN B-12 PO) Take 1 tablet by mouth daily.    . diphenhydrAMINE (BENADRYL) 25 MG tablet Take 25 mg by mouth at bedtime as needed.    . fluoruracil (CARAC) 0.5 % cream Apply 1 application topically 2 (two) times daily as needed. + CALCIPOTRIENE    . FLUoxetine (PROZAC) 20 MG capsule TAKE 1 CAPSULE BY MOUTH EVERY DAY 90 capsule 3  . folic acid (FOLVITE) 277 MCG tablet Take 800 mcg by mouth daily.    Marland Kitchen LINZESS 72 MCG capsule prn    . metoprolol succinate (TOPROL-XL) 25 MG 24 hr tablet Take 1 tablet (25 mg total) by mouth daily. 90 tablet 2  . pantoprazole (PROTONIX) 40 MG tablet Take 40 mg by mouth 2 (two) times daily.    Marland Kitchen POTASSIUM PO Take by mouth as needed.    Marland Kitchen tetrahydrozoline 0.05 % ophthalmic solution Place 1 drop into both eyes 3 (three) times daily as needed (dry eyes).    . triamterene-hydrochlorothiazide (MAXZIDE-25) 37.5-25 MG tablet TAKE 1/2 TAB BY MOUTH DAILY 90 tablet 1   No current facility-administered medications for this visit.    Allergies:   Ace inhibitors, Codeine, Demerol [meperidine], Erythromycin, Iodinated diagnostic agents, Other, and Penicillins    Social History:  The patient  reports that she has been smoking cigarettes. She has a 13.75 pack-year smoking history. She has never used smokeless tobacco. She reports current alcohol use. She reports that she does not use drugs.   Family History:  The patient's family history includes Bone cancer in her father; Cancer in her father; Congestive Heart  Failure in her mother; Dementia in her sister; Hyperlipidemia in her sister; Hypertension in her sister; Macular degeneration in her sister.    ROS:  Please see the history of present illness.   Otherwise, review of systems are positive for none.   All other systems are reviewed and negative.    PHYSICAL EXAM: VS:  BP 128/78 (BP Location: Left Arm, Patient Position: Sitting, Cuff Size: Normal)   Pulse 60   Ht 5\' 2"  (1.575 m)   Wt 162 lb 4 oz (73.6 kg)   SpO2 98%   BMI 29.68 kg/m  , BMI Body mass index is 29.68 kg/m. GEN: Well nourished, well developed, in no acute distress  HEENT: normal  Neck: no JVD, carotid bruits, or masses Cardiac: RRR;  no murmurs, rubs, or gallops,no edema  Respiratory:  clear to auscultation bilaterally, normal work of breathing GI: soft, nontender, nondistended, + BS MS: no deformity or atrophy  Skin: warm and dry, no rash Neuro:  Strength and sensation are intact Psych: euthymic mood, full affect Vascular: Femoral pulses are slightly diminished bilaterally but distal pulses are palpable.   EKG:  EKG is ordered today. The ekg ordered today demonstrates normal sinus rhythm with nonspecific anterior T wave changes.  Recent Labs: 12/01/2019: ALT 18; BUN 20; Creatinine, Ser 0.99; Hemoglobin 14.7; Platelets 351; Potassium 3.6; Sodium 141    Lipid Panel    Component Value Date/Time   CHOL 149 02/06/2016 0953   TRIG 155 (H) 02/06/2016 0953   HDL 54 02/06/2016 0953   CHOLHDL 2.8 02/06/2016 0953   CHOLHDL 4 05/17/2015 1442   VLDL 57.6 (H) 05/17/2015 1442   LDLCALC 64 02/06/2016 0953   LDLDIRECT 88.0 05/17/2015 1442      Wt Readings from Last 3 Encounters:  08/29/20 162 lb 4 oz (73.6 kg)  02/17/20 160 lb 8 oz (72.8 kg)  12/01/19 158 lb 1.6 oz (71.7 kg)       ASSESSMENT AND PLAN:  1.  Coronary artery disease involving native coronary arteries without angina: She is doing reasonably well.  However, she does have worsening exertional dyspnea and  thus I am going to obtain an echocardiogram.  2.  Essential hypertension: Blood pressure is controlled.  Continue Toprol and Maxide.  3.  Hyperlipidemia: Continue high-dose atorvastatin. I reviewed most recent lipid profile in August which showed an LDL of 66.  4.  Tobacco use: She cut down smoking to 2 cigarettes a day.    Disposition:   FU with me in 6 months.  Signed,  Susan Sacramento, MD  08/29/2020 3:27 PM    [place]

## 2020-08-31 DIAGNOSIS — M7062 Trochanteric bursitis, left hip: Secondary | ICD-10-CM | POA: Diagnosis not present

## 2020-08-31 DIAGNOSIS — M7061 Trochanteric bursitis, right hip: Secondary | ICD-10-CM | POA: Diagnosis not present

## 2020-09-01 DIAGNOSIS — Z961 Presence of intraocular lens: Secondary | ICD-10-CM | POA: Diagnosis not present

## 2020-09-03 IMAGING — RF ESOPHAGUS/BARIUM SWALLOW/TABLET STUDY
7 series · 14 of 17 positions shown · non-contrast
Comparison: None.

CLINICAL DATA: Dysphagia, choking, strangling on fluids

EXAM:
ESOPHOGRAM / BARIUM SWALLOW / BARIUM TABLET STUDY
TECHNIQUE: Combined double contrast and single contrast examination performed
using effervescent crystals, thick barium liquid, and thin barium
liquid. The patient was observed with fluoroscopy swallowing a 13 mm
barium sulphate tablet.
FLUOROSCOPY TIME:  Fluoroscopy Time:  0.4 minute
Radiation Exposure Index (if provided by the fluoroscopic device):
2.4 mGy
Number of Acquired Spot Images: 0

[Series 1: cp_standard · 0.25mm/px · 2 of 2 frames shown (1 of 7)]
[frame 1/2]
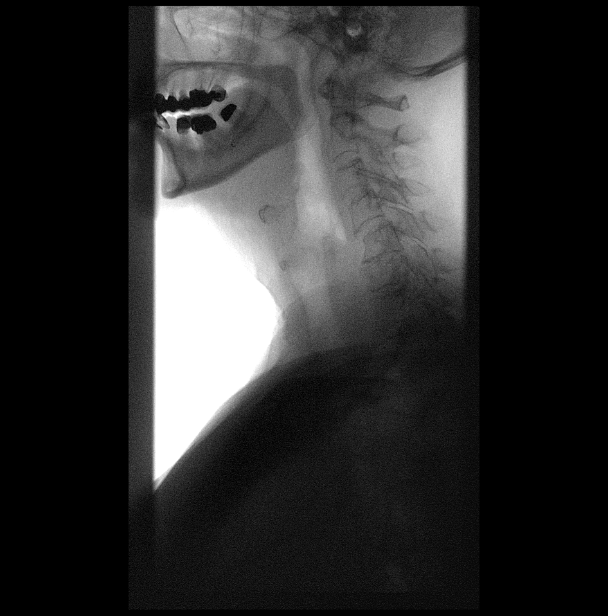
[frame 2/2]
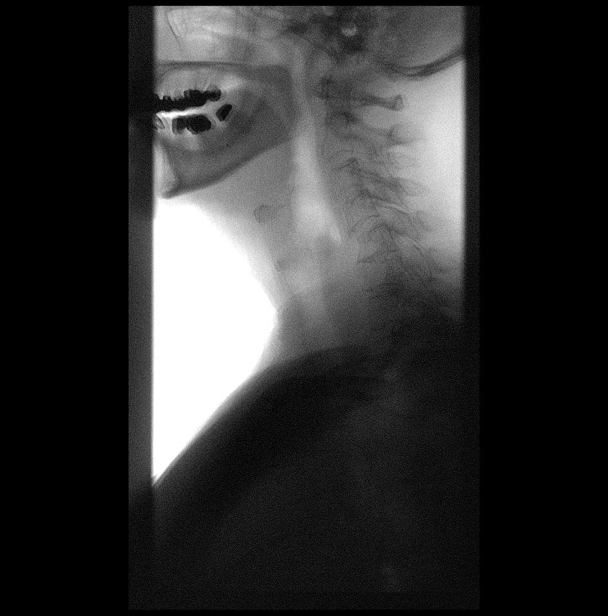

[Series 2: cp_standard · 0.25mm/px · 3 of 18 frames shown (2 of 7)]
[frame 10/18]
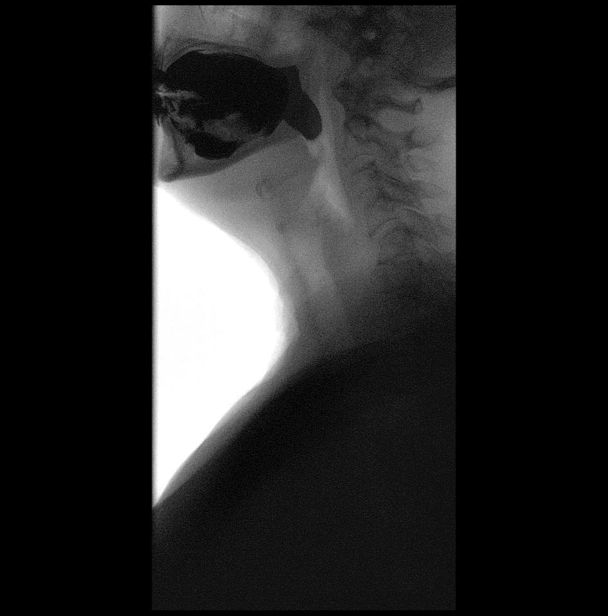
[frame 11/18]
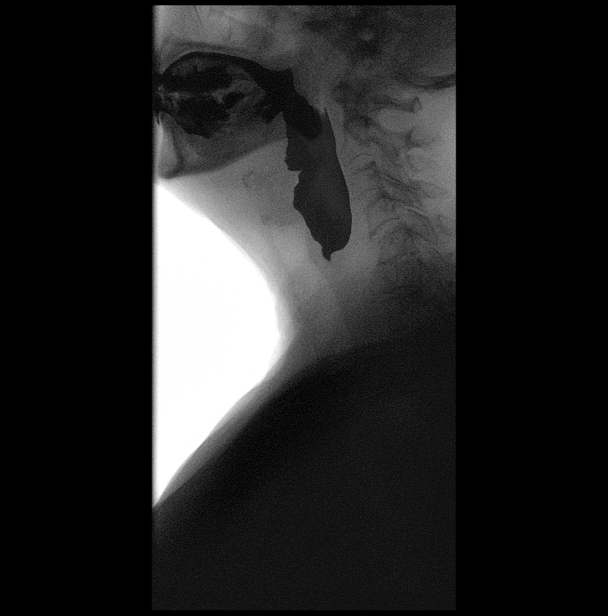
[frame 16/18]
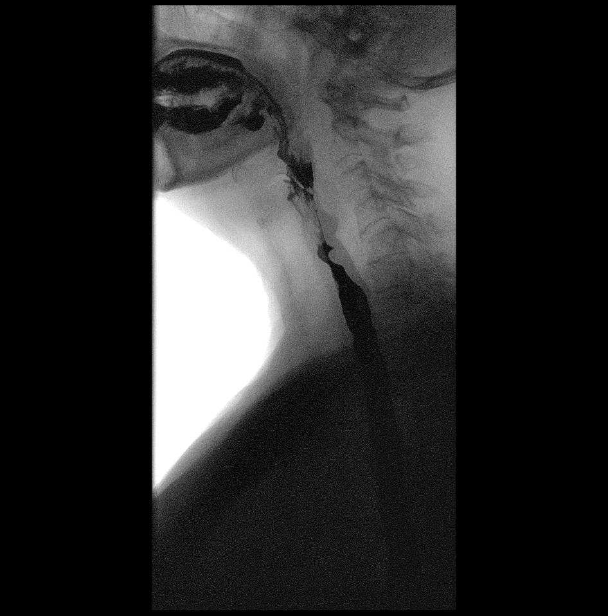

[Series 3: cp_standard · 0.25mm/px · 3 of 13 frames shown (3 of 7)]
[frame 2/13]
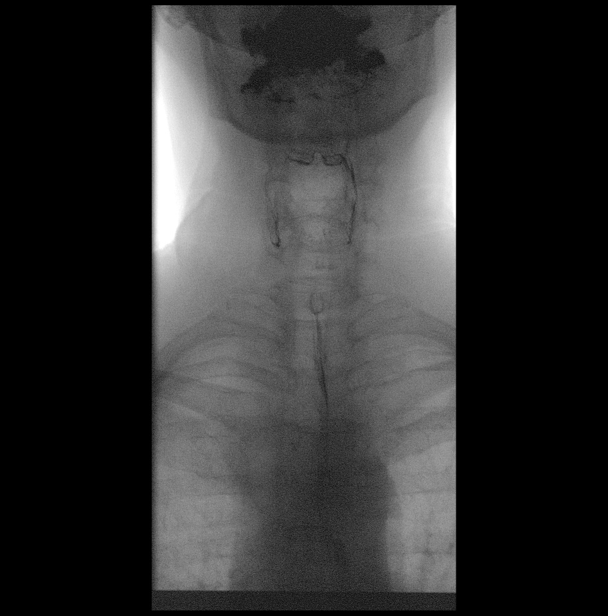
[frame 6/13]
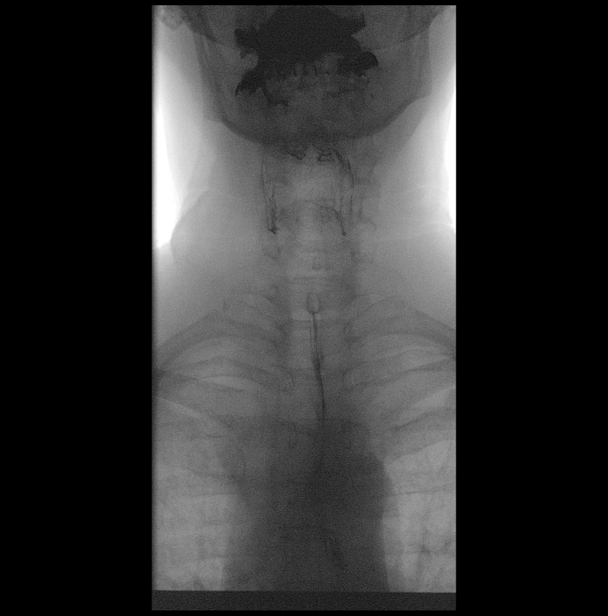
[frame 12/13]
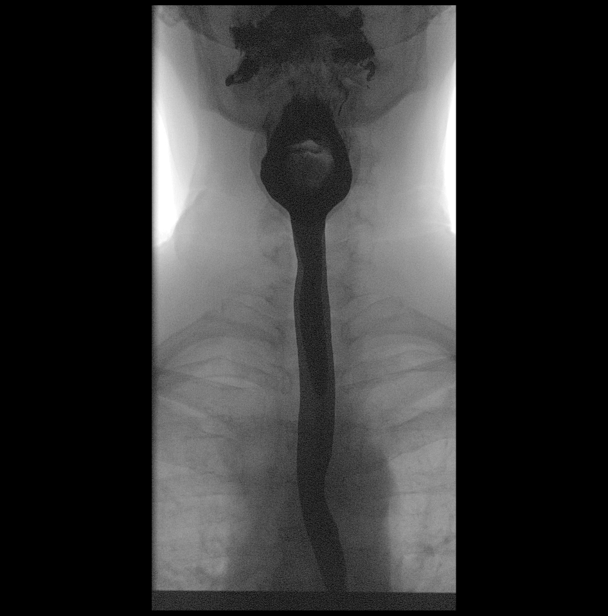

[Series 4: cp_standard · 0.25mm/px · 1 of 1 slices shown (4 of 7)]
[im 1/1]
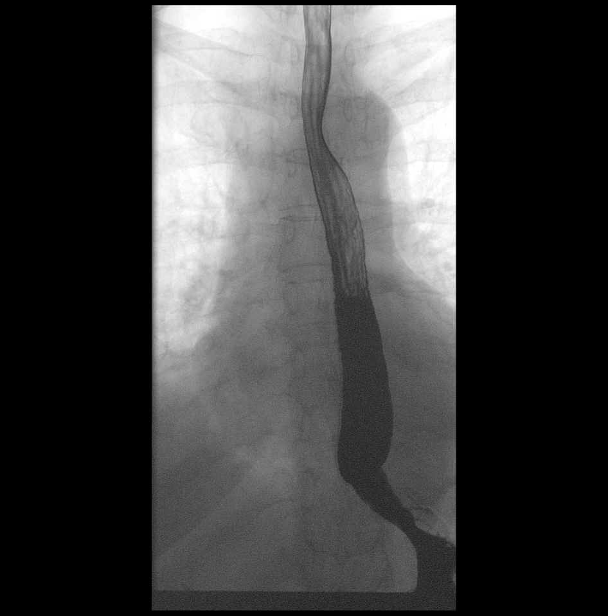

[Series 5: cp_standard · 0.25mm/px · 3 of 45 frames shown (5 of 7)]
[frame 7/45]
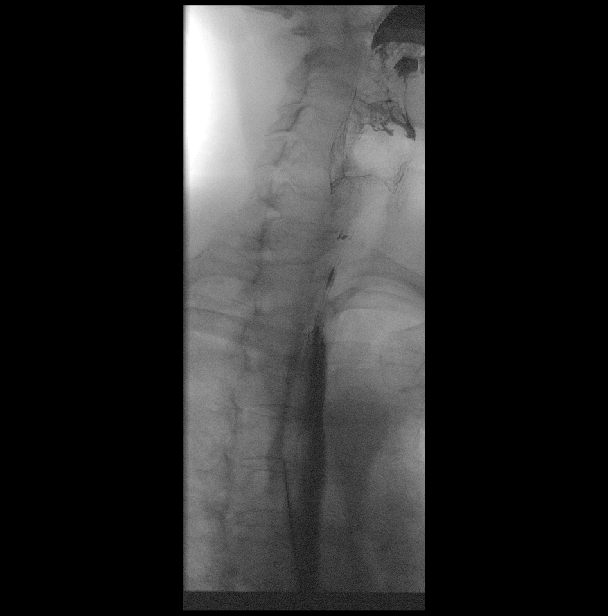
[frame 14/45]
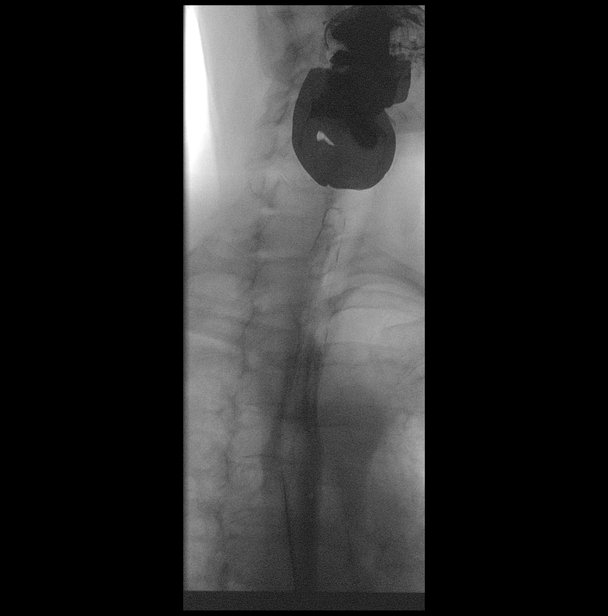
[frame 23/45]
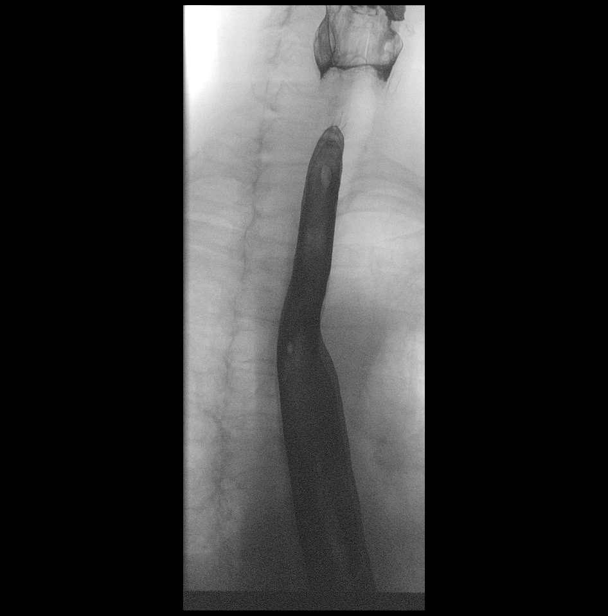

[Series 6: cp_standard · 0.27mm/px · 1 of 1 slices shown (6 of 7)]
[im 1/1]
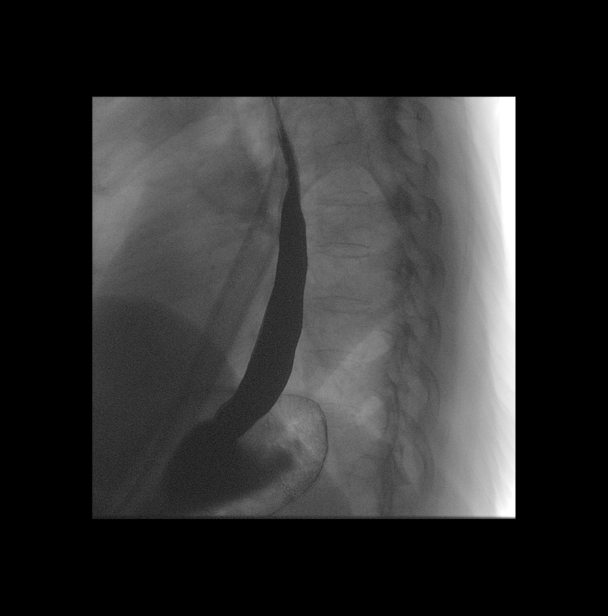

[Series 7: cp_standard · 0.28mm/px · 1 of 1 slices shown (7 of 7)]
[im 1/1]
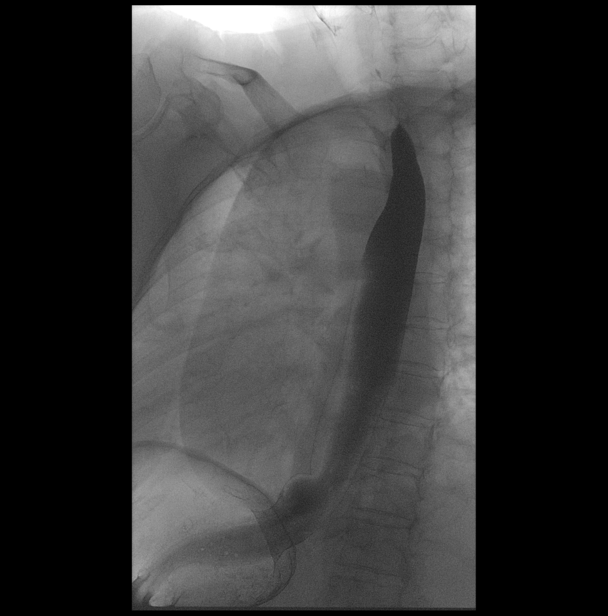

[14 of 17 positions shown; findings below may reference images not displayed]

FINDINGS: Normal pharyngeal anatomy and motility. Contrast flowed freely
through the esophagus without evidence of a stricture or mass.
Nnormal esophageal mucosa without evidence of irregularity or
ulceration. Esophageal motility was normal. Severe gastroesophageal
reflux. No definite hiatal hernia was demonstrated.

At the end of the examination a 13 mm barium tablet was administered
which transited through the esophagus and esophagogastric junction
without delay.

Degenerative disease with disc height loss at C5-6 and C6-7.
IMPRESSION: 1. Severe gastroesophageal reflux.

## 2020-09-12 DIAGNOSIS — I10 Essential (primary) hypertension: Secondary | ICD-10-CM | POA: Diagnosis not present

## 2020-09-12 DIAGNOSIS — R7303 Prediabetes: Secondary | ICD-10-CM | POA: Diagnosis not present

## 2020-09-12 DIAGNOSIS — E782 Mixed hyperlipidemia: Secondary | ICD-10-CM | POA: Diagnosis not present

## 2020-09-18 ENCOUNTER — Other Ambulatory Visit: Payer: Self-pay | Admitting: Physician Assistant

## 2020-09-18 DIAGNOSIS — Z1231 Encounter for screening mammogram for malignant neoplasm of breast: Secondary | ICD-10-CM

## 2020-09-18 DIAGNOSIS — R7303 Prediabetes: Secondary | ICD-10-CM | POA: Diagnosis not present

## 2020-09-18 DIAGNOSIS — D0471 Carcinoma in situ of skin of right lower limb, including hip: Secondary | ICD-10-CM | POA: Diagnosis not present

## 2020-09-18 DIAGNOSIS — C8510 Unspecified B-cell lymphoma, unspecified site: Secondary | ICD-10-CM | POA: Diagnosis not present

## 2020-09-18 DIAGNOSIS — I2510 Atherosclerotic heart disease of native coronary artery without angina pectoris: Secondary | ICD-10-CM | POA: Diagnosis not present

## 2020-09-18 DIAGNOSIS — I10 Essential (primary) hypertension: Secondary | ICD-10-CM | POA: Diagnosis not present

## 2020-09-18 DIAGNOSIS — M5416 Radiculopathy, lumbar region: Secondary | ICD-10-CM | POA: Diagnosis not present

## 2020-09-18 DIAGNOSIS — Z0000 Encounter for general adult medical examination without abnormal findings: Secondary | ICD-10-CM | POA: Diagnosis not present

## 2020-09-18 DIAGNOSIS — I2583 Coronary atherosclerosis due to lipid rich plaque: Secondary | ICD-10-CM | POA: Diagnosis not present

## 2020-09-18 DIAGNOSIS — E782 Mixed hyperlipidemia: Secondary | ICD-10-CM | POA: Diagnosis not present

## 2020-09-21 ENCOUNTER — Other Ambulatory Visit: Payer: Self-pay | Admitting: Family

## 2020-09-21 DIAGNOSIS — E785 Hyperlipidemia, unspecified: Secondary | ICD-10-CM

## 2020-09-22 DIAGNOSIS — M48062 Spinal stenosis, lumbar region with neurogenic claudication: Secondary | ICD-10-CM | POA: Diagnosis not present

## 2020-09-22 DIAGNOSIS — M5416 Radiculopathy, lumbar region: Secondary | ICD-10-CM | POA: Diagnosis not present

## 2020-09-25 ENCOUNTER — Ambulatory Visit
Admission: RE | Admit: 2020-09-25 | Discharge: 2020-09-25 | Disposition: A | Discharge status: Home / Self Care | Payer: PPO | Source: Ambulatory Visit | Attending: Physician Assistant | Admitting: Physician Assistant

## 2020-09-25 ENCOUNTER — Other Ambulatory Visit: Payer: Self-pay

## 2020-09-25 DIAGNOSIS — Z1231 Encounter for screening mammogram for malignant neoplasm of breast: Secondary | ICD-10-CM | POA: Diagnosis not present

## 2020-09-28 ENCOUNTER — Ambulatory Visit (INDEPENDENT_AMBULATORY_CARE_PROVIDER_SITE_OTHER): Payer: PPO

## 2020-09-28 ENCOUNTER — Other Ambulatory Visit: Payer: Self-pay

## 2020-09-28 DIAGNOSIS — R0602 Shortness of breath: Secondary | ICD-10-CM | POA: Diagnosis not present

## 2020-09-28 LAB — ECHOCARDIOGRAM COMPLETE
AR max vel: 1.89 cm2
AV Area VTI: 1.95 cm2
AV Area mean vel: 1.75 cm2
AV Mean grad: 2 mmHg
AV Peak grad: 4.7 mmHg
Ao pk vel: 1.08 m/s
Area-P 1/2: 2.28 cm2
S' Lateral: 4 cm

## 2020-09-28 MED ORDER — PERFLUTREN LIPID MICROSPHERE
1.0000 mL | INTRAVENOUS | Status: AC | PRN
  Administered 2020-09-28: 2 mL via INTRAVENOUS

## 2020-10-05 ENCOUNTER — Telehealth: Payer: Self-pay

## 2020-10-05 NOTE — Telephone Encounter (Signed)
-----   Message from Wellington Hampshire, MD sent at 10/04/2020  5:45 PM EDT ----- Inform patient that echo showed moderately reduced EF. This is worse than before. Schedule a follow up visit to discuss right and left heart cath and adjusting her meds.

## 2020-10-05 NOTE — Telephone Encounter (Signed)
Called to give the patient echo results and Dr. Tyrell Antonio recommendation. [email or web address].

## 2020-10-05 NOTE — Telephone Encounter (Signed)
Patient made aware of echo results and Dr. Tyrell Antonio recommendation.  Appt scheduled with Ignacia Bayley, NP on 10/13/20 to discuss cardiac cath.

## 2020-10-12 DIAGNOSIS — C44722 Squamous cell carcinoma of skin of right lower limb, including hip: Secondary | ICD-10-CM | POA: Diagnosis not present

## 2020-10-13 ENCOUNTER — Other Ambulatory Visit: Payer: Self-pay

## 2020-10-13 ENCOUNTER — Encounter: Payer: Self-pay | Admitting: Nurse Practitioner

## 2020-10-13 ENCOUNTER — Ambulatory Visit: Payer: PPO | Admitting: Nurse Practitioner

## 2020-10-13 VITALS — BP 128/78 | HR 63 | Ht 62.0 in | Wt 159.4 lb

## 2020-10-13 DIAGNOSIS — I5020 Unspecified systolic (congestive) heart failure: Secondary | ICD-10-CM | POA: Diagnosis not present

## 2020-10-13 DIAGNOSIS — I2510 Atherosclerotic heart disease of native coronary artery without angina pectoris: Secondary | ICD-10-CM | POA: Diagnosis not present

## 2020-10-13 DIAGNOSIS — R0602 Shortness of breath: Secondary | ICD-10-CM

## 2020-10-13 DIAGNOSIS — I255 Ischemic cardiomyopathy: Secondary | ICD-10-CM | POA: Diagnosis not present

## 2020-10-13 DIAGNOSIS — I10 Essential (primary) hypertension: Secondary | ICD-10-CM

## 2020-10-13 DIAGNOSIS — E785 Hyperlipidemia, unspecified: Secondary | ICD-10-CM

## 2020-10-13 NOTE — Progress Notes (Signed)
Office Visit    Patient Name: Susan Mcmillan Date of Encounter: 10/13/2020  Primary Care Provider:  Marinda Elk, MD Primary Cardiologist:  Kathlyn Sacramento, MD  Chief Complaint    75 y/o ? 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  Past Medical History    Past Medical History:  Diagnosis Date  . Anxiety   . CAD (coronary artery disease)    a. 11/2015 MV: mod mid-distal ant ischemia, EF 58% w/ apical HK;  b. 11/2015 Cath: LM nl, LAD 80/172m (fills via collats from RPDA & D1), D1 60ost, LCX small, nl, RCA 50p/m, RPDA nl, RPAV min irregs, RPl1/2/3 min irregs, EF 50-55%.  . Cancer of brain ([place]) 03/2017  . Dental bridge present    bilateral top.  Permanent retainer - bottom.  . Dysuria   . History of kidney stones   . Hyperlipemia   . Hypertension   . Hypertensive heart disease   . IBS (irritable bowel syndrome)   . Ischemic cardiomyopathy    a. 11/2015 V gram: EF 50-55%; b. 09/2020 Echo: EF 35-40%, ant, antsept, apical HK. Gr1 DD. Nl RV fxn. Mild MR.  . Myocardial infarction ([place])    SILENT  . Pernicious anemia   . Personal history of radiation therapy   . Personal history of tobacco use, presenting hazards to health 07/10/2015  . 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 Skin cancer   . Tobacco abuse    a. Still smoking ~ 1 cigarette/day.  Marland Kitchen Urethral caruncle   . Vertigo   . Vulvar adhesions 10/06/2013   perianal skin bridge noted at posterior fourchette and region of WLE surgical site  . Vulvar leukoplakia   . Vulvar pain    Past Surgical History:  Procedure Laterality Date  . ABDOMINAL HERNIA REPAIR  2014   Dr. Rochel Brome  . ABDOMINAL HYSTERECTOMY  1978  . APPENDECTOMY    .  BRAIN SURGERY    . CARDIAC CATHETERIZATION Left 12/07/2015   Procedure: Left Heart Cath and Coronary Angiography;  Surgeon: Wellington Hampshire, MD;  Location: [place] [facility];  Service: Cardiovascular;  Laterality: Left;  . CATARACT EXTRACTION W/PHACO Right 05/28/2018   Procedure: CATARACT EXTRACTION PHACO AND INTRAOCULAR LENS PLACEMENT ([place]) RIGHT;  Surgeon: Marchia Meiers, MD;  Location: [facility];  Service: Ophthalmology;  Laterality: Right;  [place] 00:56 CDE 7.17 Fluid pack Lot # [id number]  . CATARACT EXTRACTION W/PHACO Left 01/06/2019   Procedure: CATARACT EXTRACTION PHACO AND INTRAOCULAR LENS PLACEMENT ([facility]) LEFT  00:50.3  19.3%  9.71;  Surgeon: Leandrew Koyanagi, MD;  Location: [facility];  Service: Ophthalmology;  Laterality: Left;  . CERVICAL BIOPSY     Dr. Enzo Bi  . CHOLECYSTECTOMY    . COLONOSCOPY WITH PROPOFOL N/A 02/09/2019   Procedure: COLONOSCOPY WITH PROPOFOL;  Surgeon: Lollie Sails, MD;  Location: [facility];  Service: Endoscopy;  Laterality: N/A;  . ESOPHAGOGASTRODUODENOSCOPY (EGD) WITH PROPOFOL N/A 02/09/2019   Procedure: ESOPHAGOGASTRODUODENOSCOPY (EGD) WITH PROPOFOL;  Surgeon: Lollie Sails, MD;  Location: [facility] ENDOSCOPY;  Service: Endoscopy;  Laterality: N/A;  . MOHS SURGERY Right    Leg  . SKIN BIOPSY    . wide local excision  07/2013   vin 3 w/ margins involoved  Allergies  Allergies  Allergen Reactions  . Ace Inhibitors Other (See Comments)  . Codeine Nausea And Vomiting  . Demerol [Meperidine] Other (See Comments)    Heavy sedation  . Erythromycin Nausea Only    "All Mycins cause nausea"  . Iodinated Diagnostic Agents Nausea And Vomiting  . Other Other (See Comments)  . Penicillins Nausea Only    Can do amoxicillin Has patient had a PCN reaction causing immediate rash, facial/tongue/throat swelling, SOB or lightheadedness with hypotension: Yes Has patient had a PCN reaction causing severe rash involving mucus membranes or skin  necrosis: No Has patient had a PCN reaction that required hospitalization No Has patient had a PCN reaction occurring within the last 10 years: No If all of the above answers are "NO", then may proceed with Cephalosporin use.     History of Present Illness    75 year old female with the above past medical history including coronary artery disease, ischemic cardiomyopathy, hypertension, hyperlipidemia, tobacco abuse, chronic back and neck pain, meningioma s/p resection, and B-cell lymphoma of the brain status post radiation therapy.  She previously underwent stress testing in July 2017 which showed mid to distal anterior ischemia.  This was followed by catheterization which revealed a total occlusion of the mid LAD with right to left and left to right collaterals, as well as moderate, nonobstructive diagonal and RCA disease.  EF was 50 to 55%.  Medical therapy was recommended.  She had been doing well on medical therapy however, in April, she reported worsening dyspnea on exertion as well as an episode of abrupt onset of dyspnea that occurred while she was in the grocery store.  She had to rest and symptoms resolved within a minute or 2.  She has not any recurrence of abrupt onset of dyspnea but she has continued to have dyspnea with relatively minimal activity.  She does not experience chest pain.  As result of progressive dyspnea, an echocardiogram was performed in May, which showed worsening of LV function.  EF is now 35 to 40% with anterior, anteroseptal, and apical hypokinesis.  Grade 1 diastolic dysfunction and mild mitral regurgitation were also noted.  In the setting, follow-up was recommended for discussion regarding right and left heart catheterization.  Since her last visit, dyspnea has been stable.  She has not had any chest pain.  She reports 1 episode of becoming very diaphoretic, lightheaded, and weak while at home and this was followed by a large bowel movement.  She did not lose  consciousness.  Symptoms resolved following bowel movement.  She denies palpitations, PND, orthopnea, syncope, edema, or early satiety.  Home Medications    Prior to Admission medications   Medication Sig Start Date End Date Taking? Authorizing Provider  ALPRAZolam Duanne Moron) 0.25 MG tablet Take 2 tablets (0.5 mg total) by mouth at bedtime as needed. 11/25/14   Jackolyn Confer, MD  atorvastatin (LIPITOR) 80 MG tablet TAKE 1 TABLET(80 MG) BY MOUTH DAILY 09/21/20   Loel Dubonnet, NP  Cholecalciferol (VITAMIN D3 PO) Take 1 tablet by mouth daily.    [provider]  Cyanocobalamin (VITAMIN B-12 PO) Take 1 tablet by mouth daily.    [provider]  diphenhydrAMINE (BENADRYL) 25 MG tablet Take 25 mg by mouth at bedtime as needed.    [provider]  fluoruracil [facility]) 0.5 % cream Apply 1 application topically 2 (two) times daily as needed. + CALCIPOTRIENE 08/04/19   [provider]  FLUoxetine (PROZAC) 20 MG capsule TAKE  1 CAPSULE BY MOUTH EVERY DAY 01/25/16   Coral Spikes, DO  folic acid (FOLVITE) 035 MCG tablet Take 800 mcg by mouth daily.    [provider]  Rolan Lipa 72 MCG capsule prn 03/09/19   [provider]  metoprolol succinate (TOPROL-XL) 25 MG 24 hr tablet Take 1 tablet (25 mg total) by mouth daily. 02/15/19   Dunn, Areta Haber, PA-C  pantoprazole (PROTONIX) 40 MG tablet Take 40 mg by mouth 2 (two) times daily.    [provider]  POTASSIUM PO Take by mouth as needed.    [provider]  tetrahydrozoline 0.05 % ophthalmic solution Place 1 drop into both eyes 3 (three) times daily as needed (dry eyes).    [provider]  triamterene-hydrochlorothiazide (MAXZIDE-25) 37.5-25 MG tablet TAKE 1/2 TAB BY MOUTH DAILY 01/16/16   Burnard Hawthorne, FNP   Family history    Family History  Problem Relation Age of Onset  . Hyperlipidemia Sister   . Hypertension Sister   . Macular degeneration Sister   . Dementia Sister    . Congestive Heart Failure Mother   . Bone cancer Father   . Cancer Father        blood cancer  . Diabetes Neg Hx   . Heart disease Neg Hx     Social history    Social History   Socioeconomic History  . Marital status: Married    Spouse name: Not on file  . Number of children: Not on file  . Years of education: Not on file  . Highest education level: Not on file  Occupational History  . Occupation: retired  Tobacco Use  . Smoking status: Current Some Day Smoker    Packs/day: 0.25    Years: 55.00    Pack years: 13.75    Types: Cigarettes    Last attempt to quit: 12/07/2015    Years since quitting: 4.8  . Smokeless tobacco: Never Used  . Tobacco comment: smokes about 2 cigarettes a day  Vaping Use  . Vaping Use: Never used  Substance and Sexual Activity  . Alcohol use: Yes    Alcohol/week: 0.0 standard drinks    Comment: 8 oz Bud Light with supper  . Drug use: No  . Sexual activity: Not Currently  Other Topics Concern  . Not on file  Social History Narrative   Lives in [place]. Previously worked [employer]. Husband with dementia. Children 2 sons.   Social Determinants of Health   Financial Resource Strain: Not on file  Food Insecurity: Not on file  Transportation Needs: Not on file  Physical Activity: Not on file  Stress: Not on file  Social Connections: Not on file  Intimate Partner Violence: Not on file    Review of Systems    Progressive exertional dyspnea over the past year as outlined above.  Recently had an episode of weakness, diaphoresis, and lightheadedness that resolved following a large bowel movement.  She denies chest pain, palpitations, PND, orthopnea, syncope, edema, or early satiety.  All other systems reviewed and are otherwise negative except as noted above.  Physical Exam    VS:  BP 128/78 (BP Location: Left Arm, Patient Position: Sitting, Cuff Size: Normal)   Pulse 63   Ht 5\' 2"  (1.575 m)   Wt 159 lb 6 oz (72.3 kg)   SpO2 98%   BMI  29.15 kg/m  , BMI Body mass index is 29.15 kg/m. GEN: Well nourished, well developed, in no acute distress.  HEENT: normal. Neck: Supple, no JVD, carotid bruits, or masses. Cardiac: RRR, no murmurs, rubs, or gallops. No clubbing, cyanosis, edema.  Radials/DP/PT 2+ and equal bilaterally.  Bilateral lower extremities are erythematous. Respiratory:  Respirations regular and unlabored, clear to auscultation bilaterally. GI: Soft, nontender, nondistended, BS + x 4. MS: no deformity or atrophy. Skin: warm and dry, no rash. Neuro:  Strength and sensation are intact. Psych: Normal affect.  Accessory Clinical Findings    ECG personally reviewed by me today -regular sinus rhythm, 63, anterolateral T wave inversion - no acute changes.  Labs dated May 3 from care everywhere  Hemoglobin 14.2, hematocrit 43.2, WBC 7.5, platelets 304 Sodium 141, potassium 4.1, chloride 104, CO2 31.6, BUN 16, creatinine 0.9, glucose 77 Total bilirubin 0.5, alkaline phosphatase 88, AST 15, ALT 15 Calcium 9.3, albumin 3.9, total protein 6.4 Hemoglobin A1c 6.0 Total cholesterol 155, triglycerides 132, HDL 55.9, LDL 73  Assessment & Plan    1.  Coronary artery disease with worsening LV function and dyspnea on exertion: Patient with a history of coronary artery disease status post catheterization in 2017 revealing an occluded LAD with right to left and left to left collaterals as well as moderate nonobstructive disease involving the diagonal and RCA.  She has been medically managed since then and though she has not been experiencing chest pain, over the past year, she has noted progressive dyspnea on exertion and also had an episode of sudden onset of dyspnea while in the grocery store which lasted about a minute or 2 and resolve spontaneously.  In that setting, decision was made to pursue echocardiogram which has now shown a reduction in LV function with an EF of 35 to 40% and anterior, anteroseptal, and apical hypokinesis.   She has not had any progression of symptoms since her last cardiology visit in April.  We discussed our concern regarding worsening LV function and need for ischemic evaluation.  In light of prior coronary disease, right and left heart cardiac catheterization would be appropriate.  The patient understands that risks include but are not limited to stroke (1 in 1000), death (1 in 84), kidney failure [usually temporary] (1 in 500), bleeding (1 in 200), allergic reaction [possibly serious] (1 in 200), and agrees to proceed.  She remains on statin and beta-blocker therapy.  She is not currently on aspirin and does not want to start it just yet as she notes easy bruisability.  She understands that if she requires PCI, she will need to take both aspirin and a P2 Y 12 inhibitor.  I will follow-up a CBC and basic metabolic panel today in preparation for catheterization.  2.  Ischemic cardiomyopathy/HFrEF: This is a new diagnosis in the setting of recent echo with an EF of 35 to 40%.  She is euvolemic on examination.  She remains on beta-blocker therapy.  As she is pending catheterization, I will hold off on ARB/Entresto at this time given need for contrast but we can look to add postprocedure.    3.  Essential hypertension: Blood pressure stable on beta-blocker and diuretic therapy.  4.  Hyperlipidemia: She remains on high potency statin therapy with an LDL of 73 when checked in May.  LFTs were normal at that time.  5.  Tobacco abuse: She continues to smoke 1 to 2 cigarettes/day.  Complete cessation advised.  6.  Disposition: Follow-up labs today.  Patient tentatively scheduled for diagnostic catheterization on June 13.  Murray Hodgkins , NP 10/13/2020, 4:26 PM

## 2020-10-13 NOTE — Patient Instructions (Signed)
Medication Instructions:  No changes at this time.  *If you need a refill on your cardiac medications before your next appointment, please call your pharmacy*   Lab Work: CBC and Bmet today  If you have labs (blood work) drawn today and your tests are completely normal, you will receive your results only by: Marland Kitchen MyChart Message (if you have MyChart) OR . A paper copy in the mail If you have any lab test that is abnormal or we need to change your treatment, we will call you to review the results.   Testing/Procedures: [facility] Cardiac Cath Instructions   You are scheduled for a Cardiac Cath on: Monday June 13th  Please arrive at 08:30 am on the day of your procedure  Please expect a call from our [place] to pre-register you  Do not eat/drink anything after midnight  Someone will need to drive you home  It is recommended someone be with you for the first 24 hours after your procedure  Wear clothes that are easy to get on/off and wear slip on shoes if possible   Medications bring a current list of all medications with you  _XX__ You may take all of your medications the morning of your procedure with enough water to swallow safely   Day of your procedure: Arrive at the [facility] entrance.  Free valet service is available.  After entering the [facility] please check-in at the registration desk (1st desk on your right) to receive your armband. After receiving your armband someone will escort you to the cardiac cath/special procedures waiting area.  The usual length of stay after your procedure is about 2 to 3 hours.  This can vary.  If you have any questions, please call our office at [phone], or you may call the cardiac cath lab at [facility] directly at [phone]    Follow-Up: At [facility], you and your health needs are our priority.  As part of our continuing mission to provide you with exceptional heart care, we have created designated  Provider Care Teams.  These Care Teams include your primary Cardiologist (physician) and Advanced Practice Providers (APPs -  Physician Assistants and Nurse Practitioners) who all work together to provide you with the care you need, when you need it.   Your next appointment:   2 week(s) post cath  The format for your next appointment:   In Person  Provider:   Kathlyn Sacramento, MD or Murray Hodgkins, NP

## 2020-10-13 NOTE — H&P (View-Only) (Signed)
Office Visit    Patient Name: Susan Mcmillan Date of Encounter: 10/13/2020  Primary Care Provider:  Marinda Elk, MD Primary Cardiologist:  Kathlyn Sacramento, MD  Chief Complaint    75 y/o ? 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  Past Medical History    Past Medical History:  Diagnosis Date  . Anxiety   . CAD (coronary artery disease)    a. 11/2015 MV: mod mid-distal ant ischemia, EF 58% w/ apical HK;  b. 11/2015 Cath: LM nl, LAD 80/170m (fills via collats from RPDA & D1), D1 60ost, LCX small, nl, RCA 50p/m, RPDA nl, RPAV min irregs, RPl1/2/3 min irregs, EF 50-55%.  . Cancer of brain ([place]) 03/2017  . Dental bridge present    bilateral top.  Permanent retainer - bottom.  . Dysuria   . History of kidney stones   . Hyperlipemia   . Hypertension   . Hypertensive heart disease   . IBS (irritable bowel syndrome)   . Ischemic cardiomyopathy    a. 11/2015 V gram: EF 50-55%; b. 09/2020 Echo: EF 35-40%, ant, antsept, apical HK. Gr1 DD. Nl RV fxn. Mild MR.  . Myocardial infarction ([place])    SILENT  . Pernicious anemia   . Personal history of radiation therapy   . Personal history of tobacco use, presenting hazards to health 07/10/2015  . 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 Skin cancer   . Tobacco abuse    a. Still smoking ~ 1 cigarette/day.  Marland Kitchen Urethral caruncle   . Vertigo   . Vulvar adhesions 10/06/2013   perianal skin bridge noted at posterior fourchette and region of WLE surgical site  . Vulvar leukoplakia   . Vulvar pain    Past Surgical History:  Procedure Laterality Date  . ABDOMINAL HERNIA REPAIR  2014   Dr. Rochel Brome  . ABDOMINAL HYSTERECTOMY  1978  . APPENDECTOMY    .  BRAIN SURGERY    . CARDIAC CATHETERIZATION Left 12/07/2015   Procedure: Left Heart Cath and Coronary Angiography;  Surgeon: Wellington Hampshire, MD;  Location: [facility];  Service: Cardiovascular;  Laterality: Left;  . CATARACT EXTRACTION W/PHACO Right 05/28/2018   Procedure: CATARACT EXTRACTION PHACO AND INTRAOCULAR LENS PLACEMENT ([place]) RIGHT;  Surgeon: Marchia Meiers, MD;  Location: [facility];  Service: Ophthalmology;  Laterality: Right;  [place] 00:56 CDE 7.17 Fluid pack Lot # [id number]  . CATARACT EXTRACTION W/PHACO Left 01/06/2019   Procedure: CATARACT EXTRACTION PHACO AND INTRAOCULAR LENS PLACEMENT ([facility]) LEFT  00:50.3  19.3%  9.71;  Surgeon: Leandrew Koyanagi, MD;  Location: [facility];  Service: Ophthalmology;  Laterality: Left;  . CERVICAL BIOPSY     Dr. Enzo Bi  . CHOLECYSTECTOMY    . COLONOSCOPY WITH PROPOFOL N/A 02/09/2019   Procedure: COLONOSCOPY WITH PROPOFOL;  Surgeon: Lollie Sails, MD;  Location: [facility];  Service: Endoscopy;  Laterality: N/A;  . ESOPHAGOGASTRODUODENOSCOPY (EGD) WITH PROPOFOL N/A 02/09/2019   Procedure: ESOPHAGOGASTRODUODENOSCOPY (EGD) WITH PROPOFOL;  Surgeon: Lollie Sails, MD;  Location: [facility] END[facility];  Service: Endoscopy;  Laterality: N/A;  . MOHS SURGERY Right    Leg  . SKIN BIOPSY    . wide local excision  07/2013   vin 3 w/ margins involoved  Allergies  Allergies  Allergen Reactions  . Ace Inhibitors Other (See Comments)  . Codeine Nausea And Vomiting  . Demerol [Meperidine] Other (See Comments)    Heavy sedation  . Erythromycin Nausea Only    "All Mycins cause nausea"  . Iodinated Diagnostic Agents Nausea And Vomiting  . Other Other (See Comments)  . Penicillins Nausea Only    Can do amoxicillin Has patient had a PCN reaction causing immediate rash, facial/tongue/throat swelling, SOB or lightheadedness with hypotension: Yes Has patient had a PCN reaction causing severe rash involving mucus membranes or skin  necrosis: No Has patient had a PCN reaction that required hospitalization No Has patient had a PCN reaction occurring within the last 10 years: No If all of the above answers are "NO", then may proceed with Cephalosporin use.     History of Present Illness    75 year old female with the above past medical history including coronary artery disease, ischemic cardiomyopathy, hypertension, hyperlipidemia, tobacco abuse, chronic back and neck pain, meningioma s/p resection, and B-cell lymphoma of the brain status post radiation therapy.  She previously underwent stress testing in July 2017 which showed mid to distal anterior ischemia.  This was followed by catheterization which revealed a total occlusion of the mid LAD with right to left and left to right collaterals, as well as moderate, nonobstructive diagonal and RCA disease.  EF was 50 to 55%.  Medical therapy was recommended.  She had been doing well on medical therapy however, in April, she reported worsening dyspnea on exertion as well as an episode of abrupt onset of dyspnea that occurred while she was in the grocery store.  She had to rest and symptoms resolved within a minute or 2.  She has not any recurrence of abrupt onset of dyspnea but she has continued to have dyspnea with relatively minimal activity.  She does not experience chest pain.  As result of progressive dyspnea, an echocardiogram was performed in May, which showed worsening of LV function.  EF is now 35 to 40% with anterior, anteroseptal, and apical hypokinesis.  Grade 1 diastolic dysfunction and mild mitral regurgitation were also noted.  In the setting, follow-up was recommended for discussion regarding right and left heart catheterization.  Since her last visit, dyspnea has been stable.  She has not had any chest pain.  She reports 1 episode of becoming very diaphoretic, lightheaded, and weak while at home and this was followed by a large bowel movement.  She did not lose  consciousness.  Symptoms resolved following bowel movement.  She denies palpitations, PND, orthopnea, syncope, edema, or early satiety.  Home Medications    Prior to Admission medications   Medication Sig Start Date End Date Taking? Authorizing Provider  ALPRAZolam Duanne Moron) 0.25 MG tablet Take 2 tablets (0.5 mg total) by mouth at bedtime as needed. 11/25/14   Jackolyn Confer, MD  atorvastatin (LIPITOR) 80 MG tablet TAKE 1 TABLET(80 MG) BY MOUTH DAILY 09/21/20   Loel Dubonnet, NP  Cholecalciferol (VITAMIN D3 PO) Take 1 tablet by mouth daily.    [provider]  Cyanocobalamin (VITAMIN B-12 PO) Take 1 tablet by mouth daily.    [provider]  diphenhydrAMINE (BENADRYL) 25 MG tablet Take 25 mg by mouth at bedtime as needed.    [provider]  fluoruracil [facility]) 0.5 % cream Apply 1 application topically 2 (two) times daily as needed. + CALCIPOTRIENE 08/04/19   [provider]  FLUoxetine (PROZAC) 20 MG capsule TAKE  1 CAPSULE BY MOUTH EVERY DAY 01/25/16   Coral Spikes, DO  folic acid (FOLVITE) 093 MCG tablet Take 800 mcg by mouth daily.    [provider]  Rolan Lipa 72 MCG capsule prn 03/09/19   [provider]  metoprolol succinate (TOPROL-XL) 25 MG 24 hr tablet Take 1 tablet (25 mg total) by mouth daily. 02/15/19   Dunn, Areta Haber, PA-C  pantoprazole (PROTONIX) 40 MG tablet Take 40 mg by mouth 2 (two) times daily.    [provider]  POTASSIUM PO Take by mouth as needed.    [provider]  tetrahydrozoline 0.05 % ophthalmic solution Place 1 drop into both eyes 3 (three) times daily as needed (dry eyes).    [provider]  triamterene-hydrochlorothiazide (MAXZIDE-25) 37.5-25 MG tablet TAKE 1/2 TAB BY MOUTH DAILY 01/16/16   Burnard Hawthorne, FNP   Family history    Family History  Problem Relation Age of Onset  . Hyperlipidemia Sister   . Hypertension Sister   . Macular degeneration Sister   . Dementia Sister    . Congestive Heart Failure Mother   . Bone cancer Father   . Cancer Father        blood cancer  . Diabetes Neg Hx   . Heart disease Neg Hx     Social history    Social History   Socioeconomic History  . Marital status: Married    Spouse name: Not on file  . Number of children: Not on file  . Years of education: Not on file  . Highest education level: Not on file  Occupational History  . Occupation: retired  Tobacco Use  . Smoking status: Current Some Day Smoker    Packs/day: 0.25    Years: 55.00    Pack years: 13.75    Types: Cigarettes    Last attempt to quit: 12/07/2015    Years since quitting: 4.8  . Smokeless tobacco: Never Used  . Tobacco comment: smokes about 2 cigarettes a day  Vaping Use  . Vaping Use: Never used  Substance and Sexual Activity  . Alcohol use: Yes    Alcohol/week: 0.0 standard drinks    Comment: 8 oz Bud Light with supper  . Drug use: No  . Sexual activity: Not Currently  Other Topics Concern  . Not on file  Social History Narrative   Lives in [place]. Previously worked [employer]. Husband with dementia. Children 2 sons.   Social Determinants of Health   Financial Resource Strain: Not on file  Food Insecurity: Not on file  Transportation Needs: Not on file  Physical Activity: Not on file  Stress: Not on file  Social Connections: Not on file  Intimate Partner Violence: Not on file    Review of Systems    Progressive exertional dyspnea over the past year as outlined above.  Recently had an episode of weakness, diaphoresis, and lightheadedness that resolved following a large bowel movement.  She denies chest pain, palpitations, PND, orthopnea, syncope, edema, or early satiety.  All other systems reviewed and are otherwise negative except as noted above.  Physical Exam    VS:  BP 128/78 (BP Location: Left Arm, Patient Position: Sitting, Cuff Size: Normal)   Pulse 63   Ht 5\' 2"  (1.575 m)   Wt 159 lb 6 oz (72.3 kg)   SpO2 98%   BMI  29.15 kg/m  , BMI Body mass index is 29.15 kg/m. GEN: Well nourished, well developed, in no acute distress.  HEENT: normal. Neck: Supple, no JVD, carotid bruits, or masses. Cardiac: RRR, no murmurs, rubs, or gallops. No clubbing, cyanosis, edema.  Radials/DP/PT 2+ and equal bilaterally.  Bilateral lower extremities are erythematous. Respiratory:  Respirations regular and unlabored, clear to auscultation bilaterally. GI: Soft, nontender, nondistended, BS + x 4. MS: no deformity or atrophy. Skin: warm and dry, no rash. Neuro:  Strength and sensation are intact. Psych: Normal affect.  Accessory Clinical Findings    ECG personally reviewed by me today -regular sinus rhythm, 63, anterolateral T wave inversion - no acute changes.  Labs dated May 3 from care everywhere  Hemoglobin 14.2, hematocrit 43.2, WBC 7.5, platelets 304 Sodium 141, potassium 4.1, chloride 104, CO2 31.6, BUN 16, creatinine 0.9, glucose 77 Total bilirubin 0.5, alkaline phosphatase 88, AST 15, ALT 15 Calcium 9.3, albumin 3.9, total protein 6.4 Hemoglobin A1c 6.0 Total cholesterol 155, triglycerides 132, HDL 55.9, LDL 73  Assessment & Plan    1.  Coronary artery disease with worsening LV function and dyspnea on exertion: Patient with a history of coronary artery disease status post catheterization in 2017 revealing an occluded LAD with right to left and left to left collaterals as well as moderate nonobstructive disease involving the diagonal and RCA.  She has been medically managed since then and though she has not been experiencing chest pain, over the past year, she has noted progressive dyspnea on exertion and also had an episode of sudden onset of dyspnea while in the grocery store which lasted about a minute or 2 and resolve spontaneously.  In that setting, decision was made to pursue echocardiogram which has now shown a reduction in LV function with an EF of 35 to 40% and anterior, anteroseptal, and apical hypokinesis.   She has not had any progression of symptoms since her last cardiology visit in April.  We discussed our concern regarding worsening LV function and need for ischemic evaluation.  In light of prior coronary disease, right and left heart cardiac catheterization would be appropriate.  The patient understands that risks include but are not limited to stroke (1 in 1000), death (1 in 86), kidney failure [usually temporary] (1 in 500), bleeding (1 in 200), allergic reaction [possibly serious] (1 in 200), and agrees to proceed.  She remains on statin and beta-blocker therapy.  She is not currently on aspirin and does not want to start it just yet as she notes easy bruisability.  She understands that if she requires PCI, she will need to take both aspirin and a P2 Y 12 inhibitor.  I will follow-up a CBC and basic metabolic panel today in preparation for catheterization.  2.  Ischemic cardiomyopathy/HFrEF: This is a new diagnosis in the setting of recent echo with an EF of 35 to 40%.  She is euvolemic on examination.  She remains on beta-blocker therapy.  As she is pending catheterization, I will hold off on ARB/Entresto at this time given need for contrast but we can look to add postprocedure.    3.  Essential hypertension: Blood pressure stable on beta-blocker and diuretic therapy.  4.  Hyperlipidemia: She remains on high potency statin therapy with an LDL of 73 when checked in May.  LFTs were normal at that time.  5.  Tobacco abuse: She continues to smoke 1 to 2 cigarettes/day.  Complete cessation advised.  6.  Disposition: Follow-up labs today.  Patient tentatively scheduled for diagnostic catheterization on June 13.  Murray Hodgkins , NP 10/13/2020, 4:26 PM

## 2020-10-14 LAB — BASIC METABOLIC PANEL
BUN/Creatinine Ratio: 15 (ref 12–28)
BUN: 12 mg/dL (ref 8–27)
CO2: 23 mmol/L (ref 20–29)
Calcium: 9.4 mg/dL (ref 8.7–10.3)
Chloride: 99 mmol/L (ref 96–106)
Creatinine, Ser: 0.82 mg/dL (ref 0.57–1.00)
Glucose: 88 mg/dL (ref 65–99)
Potassium: 4.4 mmol/L (ref 3.5–5.2)
Sodium: 141 mmol/L (ref 134–144)
eGFR: 75 mL/min/{1.73_m2} (ref >59)

## 2020-10-14 LAB — CBC
Hematocrit: 44.5 % (ref 34.0–46.6)
Hemoglobin: 14.4 g/dL (ref 11.1–15.9)
MCH: 29.8 pg (ref 26.6–33.0)
MCHC: 32.4 g/dL (ref 31.5–35.7)
MCV: 92 fL (ref 79–97)
Platelets: 327 10*3/uL (ref 150–450)
RBC: 4.83 x10E6/uL (ref 3.77–5.28)
RDW: 13.2 % (ref 11.7–15.4)
WBC: 5.8 10*3/uL (ref 3.4–10.8)

## 2020-10-23 ENCOUNTER — Encounter: Payer: Self-pay | Admitting: Cardiovascular Disease

## 2020-10-23 ENCOUNTER — Other Ambulatory Visit: Payer: Self-pay

## 2020-10-23 ENCOUNTER — Encounter
Admission: RE | Disposition: A | Discharge status: Home / Self Care | Payer: Self-pay | Source: Home / Self Care | Attending: Cardiovascular Disease

## 2020-10-23 ENCOUNTER — Ambulatory Visit
Admission: RE | Admit: 2020-10-23 | Discharge: 2020-10-23 | Disposition: A | Discharge status: Home / Self Care | Payer: PPO | Attending: Cardiovascular Disease | Admitting: Cardiovascular Disease

## 2020-10-23 DIAGNOSIS — Z880 Allergy status to penicillin: Secondary | ICD-10-CM | POA: Diagnosis not present

## 2020-10-23 DIAGNOSIS — E785 Hyperlipidemia, unspecified: Secondary | ICD-10-CM | POA: Insufficient documentation

## 2020-10-23 DIAGNOSIS — Z881 Allergy status to other antibiotic agents status: Secondary | ICD-10-CM | POA: Diagnosis not present

## 2020-10-23 DIAGNOSIS — I255 Ischemic cardiomyopathy: Secondary | ICD-10-CM | POA: Diagnosis not present

## 2020-10-23 DIAGNOSIS — I2582 Chronic total occlusion of coronary artery: Secondary | ICD-10-CM | POA: Insufficient documentation

## 2020-10-23 DIAGNOSIS — R931 Abnormal findings on diagnostic imaging of heart and coronary circulation: Secondary | ICD-10-CM

## 2020-10-23 DIAGNOSIS — I110 Hypertensive heart disease with heart failure: Secondary | ICD-10-CM | POA: Insufficient documentation

## 2020-10-23 DIAGNOSIS — Z8249 Family history of ischemic heart disease and other diseases of the circulatory system: Secondary | ICD-10-CM | POA: Insufficient documentation

## 2020-10-23 DIAGNOSIS — I25118 Atherosclerotic heart disease of native coronary artery with other forms of angina pectoris: Secondary | ICD-10-CM | POA: Insufficient documentation

## 2020-10-23 DIAGNOSIS — Z91041 Radiographic dye allergy status: Secondary | ICD-10-CM | POA: Diagnosis not present

## 2020-10-23 DIAGNOSIS — Z79899 Other long term (current) drug therapy: Secondary | ICD-10-CM | POA: Insufficient documentation

## 2020-10-23 DIAGNOSIS — Z885 Allergy status to narcotic agent status: Secondary | ICD-10-CM | POA: Diagnosis not present

## 2020-10-23 DIAGNOSIS — F17210 Nicotine dependence, cigarettes, uncomplicated: Secondary | ICD-10-CM | POA: Insufficient documentation

## 2020-10-23 DIAGNOSIS — I5021 Acute systolic (congestive) heart failure: Secondary | ICD-10-CM | POA: Insufficient documentation

## 2020-10-23 DIAGNOSIS — I2510 Atherosclerotic heart disease of native coronary artery without angina pectoris: Secondary | ICD-10-CM

## 2020-10-23 HISTORY — PX: RIGHT/LEFT HEART CATH AND CORONARY ANGIOGRAPHY: CATH118266

## 2020-10-23 SURGERY — RIGHT/LEFT HEART CATH AND CORONARY ANGIOGRAPHY
Anesthesia: Moderate Sedation

## 2020-10-23 MED ORDER — LIDOCAINE HCL (PF) 1 % IJ SOLN
INTRAMUSCULAR | Status: DC | PRN
  Administered 2020-10-23: 20 mL

## 2020-10-23 MED ORDER — SODIUM CHLORIDE 0.9% FLUSH: 3.0000 mL | Freq: Two times a day (BID) | INTRAVENOUS | Status: DC

## 2020-10-23 MED ORDER — SODIUM CHLORIDE 0.9 % IV SOLN: INTRAVENOUS | Status: DC

## 2020-10-23 MED ORDER — MIDAZOLAM HCL 2 MG/2ML IJ SOLN
INTRAMUSCULAR | Status: AC
  Filled 2020-10-23: qty 2

## 2020-10-23 MED ORDER — LIDOCAINE HCL (PF) 1 % IJ SOLN
INTRAMUSCULAR | Status: AC
  Filled 2020-10-23: qty 30

## 2020-10-23 MED ORDER — ASPIRIN 81 MG PO CHEW
CHEWABLE_TABLET | ORAL | Status: AC
  Administered 2020-10-23: 81 mg via ORAL
  Filled 2020-10-23: qty 1

## 2020-10-23 MED ORDER — IOHEXOL 300 MG/ML  SOLN
INTRAMUSCULAR | Status: DC | PRN
  Administered 2020-10-23: 45 mL

## 2020-10-23 MED ORDER — ASPIRIN 81 MG PO CHEW
81.0000 mg | CHEWABLE_TABLET | ORAL | Status: AC
Start: 2020-10-24 — End: 2020-10-23

## 2020-10-23 MED ORDER — MIDAZOLAM HCL 2 MG/2ML IJ SOLN
INTRAMUSCULAR | Status: DC | PRN
  Administered 2020-10-23: 1 mg via INTRAVENOUS

## 2020-10-23 MED ORDER — HEPARIN SODIUM (PORCINE) 1000 UNIT/ML IJ SOLN
INTRAMUSCULAR | Status: AC
  Filled 2020-10-23: qty 1

## 2020-10-23 MED ORDER — HEPARIN (PORCINE) IN NACL 1000-0.9 UT/500ML-% IV SOLN
INTRAVENOUS | Status: DC | PRN
  Administered 2020-10-23: 1000 mL

## 2020-10-23 MED ORDER — SODIUM CHLORIDE 0.9% FLUSH
3.0000 mL | INTRAVENOUS | Status: DC | PRN
Start: 2020-10-23 — End: 2020-10-23

## 2020-10-23 MED ORDER — FENTANYL CITRATE (PF) 100 MCG/2ML IJ SOLN
INTRAMUSCULAR | Status: AC
  Filled 2020-10-23: qty 2

## 2020-10-23 MED ORDER — ATORVASTATIN CALCIUM 80 MG PO TABS: 80.0000 mg | ORAL_TABLET | Freq: Every morning | ORAL | Status: DC

## 2020-10-23 MED ORDER — HEPARIN (PORCINE) IN NACL 1000-0.9 UT/500ML-% IV SOLN
INTRAVENOUS | Status: AC
  Filled 2020-10-23: qty 1000

## 2020-10-23 MED ORDER — ONDANSETRON HCL 4 MG/2ML IJ SOLN: 4.0000 mg | Freq: Four times a day (QID) | INTRAMUSCULAR | Status: DC | PRN

## 2020-10-23 MED ORDER — VERAPAMIL HCL 2.5 MG/ML IV SOLN
INTRAVENOUS | Status: AC
  Filled 2020-10-23: qty 2

## 2020-10-23 MED ORDER — SODIUM CHLORIDE 0.9 % IV SOLN: 250.0000 mL | INTRAVENOUS | Status: DC | PRN

## 2020-10-23 MED ORDER — FENTANYL CITRATE (PF) 100 MCG/2ML IJ SOLN
INTRAMUSCULAR | Status: DC | PRN
  Administered 2020-10-23: 50 ug via INTRAVENOUS

## 2020-10-23 MED ORDER — SODIUM CHLORIDE 0.9% FLUSH: 3.0000 mL | INTRAVENOUS | Status: DC | PRN

## 2020-10-23 MED ORDER — ACETAMINOPHEN 325 MG PO TABS: 650.0000 mg | ORAL_TABLET | ORAL | Status: DC | PRN

## 2020-10-23 MED ORDER — TRIAMTERENE-HCTZ 37.5-25 MG PO TABS: 0.5000 | ORAL_TABLET | Freq: Every morning | ORAL | Status: DC

## 2020-10-23 SURGICAL SUPPLY — 15 items
CANNULA 5F STIFF (CANNULA) ×2 IMPLANT
CATH BALLN WEDGE 5F 110CM (CATHETERS) ×2 IMPLANT
CATH INFINITI 5FR JL4 (CATHETERS) ×2 IMPLANT
CATH INFINITI JR4 5F (CATHETERS) ×2 IMPLANT
DEVICE CLOSURE MYNXGRIP 5F (Vascular Products) ×2 IMPLANT
DRAPE BRACHIAL (DRAPES) ×2 IMPLANT
GLIDESHEATH SLEND SS 6F .021 (SHEATH) IMPLANT
PACK CARDIAC CATH (CUSTOM PROCEDURE TRAY) ×2 IMPLANT
PROTECTION STATION PRESSURIZED (MISCELLANEOUS) ×2
SET ATX SIMPLICITY (MISCELLANEOUS) ×2 IMPLANT
SHEATH AVANTI 5FR X 11CM (SHEATH) ×2 IMPLANT
SHEATH GLIDE SLENDER 4/5FR (SHEATH) ×2 IMPLANT
STATION PROTECTION PRESSURIZED (MISCELLANEOUS) ×1 IMPLANT
WIRE GUIDERIGHT .035X150 (WIRE) ×2 IMPLANT
WIRE ROSEN-J .035X260CM (WIRE) IMPLANT

## 2020-10-23 NOTE — Interval H&P Note (Signed)
Cath Lab Visit (complete for each Cath Lab visit)  Clinical Evaluation Leading to the Procedure:   ACS: No.  Non-ACS:    Anginal Classification: CCS III  Anti-ischemic medical therapy: Maximal Therapy (2 or more classes of medications)  Non-Invasive Test Results: No non-invasive testing performed  Prior CABG: No previous CABG      History and Physical Interval Note:  10/23/2020 10:40 AM  Susan Mcmillan  has presented today for surgery, with the diagnosis of RT and LT Cath   Abnormal echocardiogram.  The various methods of treatment have been discussed with the patient and family. After consideration of risks, benefits and other options for treatment, the patient has consented to  Procedure(s): RIGHT/LEFT HEART CATH AND CORONARY ANGIOGRAPHY (N/A) as a surgical intervention.  The patient's history has been reviewed, patient examined, no change in status, stable for surgery.  I have reviewed the patient's chart and labs.  Questions were answered to the patient's satisfaction.     Kathlyn Sacramento

## 2020-10-31 DIAGNOSIS — C44622 Squamous cell carcinoma of skin of right upper limb, including shoulder: Secondary | ICD-10-CM | POA: Diagnosis not present

## 2020-10-31 DIAGNOSIS — L720 Epidermal cyst: Secondary | ICD-10-CM | POA: Diagnosis not present

## 2020-10-31 DIAGNOSIS — L858 Other specified epidermal thickening: Secondary | ICD-10-CM | POA: Diagnosis not present

## 2020-10-31 DIAGNOSIS — Z1283 Encounter for screening for malignant neoplasm of skin: Secondary | ICD-10-CM | POA: Diagnosis not present

## 2020-10-31 DIAGNOSIS — L814 Other melanin hyperpigmentation: Secondary | ICD-10-CM | POA: Diagnosis not present

## 2020-10-31 DIAGNOSIS — D492 Neoplasm of unspecified behavior of bone, soft tissue, and skin: Secondary | ICD-10-CM | POA: Diagnosis not present

## 2020-10-31 DIAGNOSIS — L821 Other seborrheic keratosis: Secondary | ICD-10-CM | POA: Diagnosis not present

## 2020-10-31 DIAGNOSIS — L570 Actinic keratosis: Secondary | ICD-10-CM | POA: Diagnosis not present

## 2020-10-31 DIAGNOSIS — D0471 Carcinoma in situ of skin of right lower limb, including hip: Secondary | ICD-10-CM | POA: Diagnosis not present

## 2020-11-16 ENCOUNTER — Other Ambulatory Visit: Payer: Self-pay

## 2020-11-16 ENCOUNTER — Ambulatory Visit: Payer: PPO | Admitting: Cardiovascular Disease

## 2020-11-16 ENCOUNTER — Encounter: Payer: Self-pay | Admitting: Cardiovascular Disease

## 2020-11-16 VITALS — BP 128/70 | HR 64 | Ht 63.0 in | Wt 159.0 lb

## 2020-11-16 DIAGNOSIS — I10 Essential (primary) hypertension: Secondary | ICD-10-CM

## 2020-11-16 DIAGNOSIS — I5022 Chronic systolic (congestive) heart failure: Secondary | ICD-10-CM | POA: Diagnosis not present

## 2020-11-16 DIAGNOSIS — I2510 Atherosclerotic heart disease of native coronary artery without angina pectoris: Secondary | ICD-10-CM

## 2020-11-16 DIAGNOSIS — E785 Hyperlipidemia, unspecified: Secondary | ICD-10-CM

## 2020-11-16 MED ORDER — ENTRESTO 24-26 MG PO TABS: 1.0000 | ORAL_TABLET | Freq: Two times a day (BID) | ORAL | 5 refills | Status: DC

## 2020-11-16 NOTE — Patient Instructions (Signed)
Medication Instructions:  Your physician has recommended you make the following change in your medication:   1) STOP Potassium  2) STOP Maxzide  3) START Entresto 24/26 mg twice a day. An Rx has been sent to your pharmacy.   *If you need a refill on your cardiac medications before your next appointment, please call your pharmacy*   Lab Work: Lab work as planned at the [facility] on 11/28/20  If you have labs (blood work) drawn today and your tests are completely normal, you will receive your results only by: [place] (if you have MyChart) OR A paper copy in the mail If you have any lab test that is abnormal or we need to change your treatment, we will call you to review the results.   Testing/Procedures: None ordered   Follow-Up: At [facility], you and your health needs are our priority.  As part of our continuing mission to provide you with exceptional heart care, we have created designated Provider Care Teams.  These Care Teams include your primary Cardiologist (physician) and Advanced Practice Providers (APPs -  Physician Assistants and Nurse Practitioners) who all work together to provide you with the care you need, when you need it.  We recommend signing up for the patient portal called "MyChart".  Sign up information is provided on this After Visit Summary.  MyChart is used to connect with patients for Virtual Visits (Telemedicine).  Patients are able to view lab/test results, encounter notes, upcoming appointments, etc.  Non-urgent messages can be sent to your provider as well.   To learn more about what you can do with MyChart, go to [email or web address].    Your next appointment:   4 week(s)  The format for your next appointment:   In Person  Provider:   You may see Kathlyn Sacramento, MD or one of the following Advanced Practice Providers on your designated Care Team:   Murray Hodgkins, NP Christell Faith, PA-C Marrianne Mood, PA-C Cadence [place],  [place] Laurann [place], NP   Other Instructions N/A

## 2020-11-16 NOTE — Progress Notes (Signed)
Cardiology Office Note   Date:  11/16/2020   ID:  Susan, Mcmillan 01-Jun-1945, MRN [id number]  PCP:  Susan Elk, MD  Cardiologist:   Susan Sacramento, MD   Chief Complaint  Patient presents with   Other    Follow up post Cath. Meds reviewed verbally with patient.        History of Present Illness: Susan Mcmillan is a 75 y.o. female who Is here today for a follow-up visit regarding coronary artery diseas and ischemic cardiomyopathy. 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  She was found to have significant coronary artery atherosclerosis and calcifications on previous CT scan in 2017. Nuclear stress test showed moderate mid to distal anterior wall ischemia with normal ejection fraction.  Cardiac catheterization showed occluded mid LAD with right to left collaterals and left to left collaterals.  She otherwise had moderate RCA disease.   She has been treated medically since then.    Carotid Doppler in December 2020 showed no significant carotid disease.  She had worsening shortness of breath few months ago.  Repeat echocardiogram in May showed decreased ejection fraction to 35 to 40% with grade 1 diastolic dysfunction and mild mitral regurgitation.  Right and left cardiac catheterization was performed last month which showed no significant change since 2017 with chronically occluded mid LAD with collaterals, moderate proximal stenosis in first diagonal and moderate RCA disease.  Right heart catheterization showed normal right and left-sided filling pressures, minimal pulmonary hypertension and severely reduced cardiac output.  She reports no chest pain but does have dyspnea with minimal exertion.  No significant leg edema.  She has chronic bluish discoloration of  both feet but no history of peripheral arterial disease.    Past Medical History:  Diagnosis Date   Anxiety    CAD (coronary artery disease)    a. 11/2015 MV: mod mid-distal ant ischemia, EF 58% w/ apical HK;  b. 11/2015 Cath: LM nl, LAD 80/160m (fills via collats from RPDA & D1), D1 60ost, LCX small, nl, RCA 50p/m, RPDA nl, RPAV min irregs, RPl1/2/3 min irregs, EF 50-55%.   Cancer of brain [facility]) 03/2017   Dental bridge present    bilateral top.  Permanent retainer - bottom.   Dysuria    History of kidney stones    Hyperlipemia    Hypertension    Hypertensive heart disease    IBS (irritable bowel syndrome)    Ischemic cardiomyopathy    a. 11/2015 V gram: EF 50-55%; b. 09/2020 Echo: EF 35-40%, ant, antsept, apical HK. Gr1 DD. Nl RV fxn. Mild MR.   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 Still smoking ~ 1 cigarette/day.   Urethral caruncle    Vertigo    Vulvar adhesions 10/06/2013   perianal skin bridge noted at posterior fourchette and region of WLE surgical site   Vulvar leukoplakia    Vulvar pain     Past Surgical History:  Procedure Laterality Date   ABDOMINAL HERNIA REPAIR  2014  Dr. Rochel Brome   ABDOMINAL HYSTERECTOMY  1978   APPENDECTOMY     BRAIN SURGERY     CARDIAC CATHETERIZATION Left 12/07/2015   Procedure: Left Heart Cath and Coronary Angiography;  Surgeon: Susan Hampshire, MD;  Location: [place] [facility];  Service: Cardiovascular;  Laterality: Left;   CATARACT EXTRACTION W/PHACO Right 05/28/2018   Procedure: CATARACT EXTRACTION PHACO AND INTRAOCULAR LENS PLACEMENT ([place]) RIGHT;  Surgeon: Susan Meiers, MD;  Location: [facility] ORS;  Service: Ophthalmology;  Laterality: Right;  [place] 00:56 CDE 7.17 Fluid pack Lot # [id number]   CATARACT  EXTRACTION W/PHACO Left 01/06/2019   Procedure: CATARACT EXTRACTION PHACO AND INTRAOCULAR LENS PLACEMENT ([facility]) LEFT  00:50.3  19.3%  9.71;  Surgeon: Susan Koyanagi, MD;  Location: [place];  Service: Ophthalmology;  Laterality: Left;   CERVICAL BIOPSY     Dr. Enzo Mcmillan   CHOLECYSTECTOMY     COLONOSCOPY WITH PROPOFOL N/A 02/09/2019   Procedure: COLONOSCOPY WITH PROPOFOL;  Surgeon: Susan Sails, MD;  [facility]: [facility] ENDOSCOPY;  Service: Endoscopy;  Laterality: N/A;   ESOPHAGOGASTRODUODENOSCOPY (EGD) WITH PROPOFOL N/A 02/09/2019   Procedure: ESOPHAGOGASTRODUODENOSCOPY (EGD) WITH PROPOFOL;  Surgeon: Susan Sails, MD;  Location: [facility] ENDOSCOPY;  Service: Endoscopy;  Laterality: N/A;   MOHS SURGERY Right    Leg   RIGHT/LEFT HEART CATH AND CORONARY ANGIOGRAPHY N/A 10/23/2020   Procedure: RIGHT/LEFT HEART CATH AND CORONARY ANGIOGRAPHY;  Surgeon: Susan Hampshire, MD;  Location: [place] [facility];  Service: Cardiovascular;  Laterality: N/A;   SKIN BIOPSY     wide local excision  07/2013   vin 3 w/ margins involoved     Current Outpatient Medications  Medication Sig Dispense Refill   atorvastatin (LIPITOR) 80 MG tablet Take 1 tablet (80 mg total) by mouth in the morning.     Cholecalciferol (VITAMIN D3) 125 MCG (5000 UT) TABS Take 5,000 Units by mouth in the morning.     Cyanocobalamin (VITAMIN B-12) 5000 MCG SUBL Place 5,000 mcg under the tongue daily as needed (low energy (feeling poorly)).     diphenhydrAMINE (BENADRYL) 25 MG tablet Take 25 mg by mouth at bedtime.     folic acid (FOLVITE) 737 MCG tablet Take 400 mcg by mouth in the morning.     metoprolol succinate (TOPROL-XL) 50 MG 24 hr tablet Take 50 mg by mouth in the morning.     NONFORMULARY OR COMPOUNDED ITEM Apply 1 application topically 2 (two) times daily as needed (cancer spots). FLUOROURACIL 5% + CALCIPOTRIENE 0.005%     pantoprazole (PROTONIX) 40 MG tablet Take 40 mg by mouth 2 (two) times daily.      tetrahydrozoline 0.05 % ophthalmic solution Place 1 drop into both eyes 3 (three) times daily as needed (dry eyes).     No current facility-administered medications for this visit.    Allergies:   Tape, Ace inhibitors, Codeine, Demerol [meperidine], Erythromycin, Iodinated diagnostic agents, Other, and Penicillins    Social History:  The patient  reports that she has been smoking cigarettes. She has a 13.75 pack-year smoking history. She has never used smokeless tobacco. She reports current alcohol use. She reports that she does not use drugs.   Family History:  The patient's family history includes Bone cancer in her father; Cancer in her father; Congestive Heart Failure in her mother; Dementia in her sister; Hyperlipidemia in her sister; Hypertension in her sister; Macular degeneration in her sister.    ROS:  Please see the history of present illness.  Otherwise, review of systems are positive for none.   All other systems are reviewed and negative.    PHYSICAL EXAM: VS:  BP 128/70 (BP Location: Left Arm, Patient Position: Sitting, Cuff Size: Normal)   Pulse 64   Ht 5\' 3"  (1.6 m)   Wt 159 lb (72.1 kg)   SpO2 96%   BMI 28.17 kg/m  , BMI Body mass index is 28.17 kg/m. GEN: Well nourished, well developed, in no acute distress  HEENT: normal  Neck: no JVD, carotid bruits, or masses Cardiac: RRR; no murmurs, rubs, or gallops,no edema  Respiratory:  clear to auscultation bilaterally, normal work of breathing GI: soft, nontender, nondistended, + BS MS: no deformity or atrophy  Skin: warm and dry, no rash Neuro:  Strength and sensation are intact Psych: euthymic mood, full affect Vascular: Femoral pulses are slightly diminished bilaterally but distal pulses are palpable. No groin hematoma.   EKG:  EKG is not ordered today.   Recent Labs: 12/01/2019: ALT 18 10/13/2020: BUN 12; Creatinine, Ser 0.82; Hemoglobin 14.4; Platelets 327; Potassium 4.4; Sodium 141    Lipid Panel     Component Value Date/Time   CHOL 149 02/06/2016 0953   TRIG 155 (H) 02/06/2016 0953   HDL 54 02/06/2016 0953   CHOLHDL 2.8 02/06/2016 0953   CHOLHDL 4 05/17/2015 1442   VLDL 57.6 (H) 05/17/2015 1442   LDLCALC 64 02/06/2016 0953   LDLDIRECT 88.0 05/17/2015 1442      Wt Readings from Last 3 Encounters:  11/16/20 159 lb (72.1 kg)  10/23/20 159 lb 9.6 oz (72.4 kg)  10/13/20 159 lb 6 oz (72.3 kg)       ASSESSMENT AND PLAN:  1.  Coronary artery disease involving native coronary arteries without angina: Recent cardiac catheterization showed no significant change in coronary anatomy from before.  She has chronically occluded LAD with collaterals and moderate diagonal and RCA disease.  Recommend aggressive medical therapy.  2.  Chronic systolic heart failure: Ejection fraction was 35 to 40% likely due to ischemic cardiomyopathy.  She is euvolemic clinically and by right heart catheterization.  I elected to discontinue triamterene-hydrochlorothiazide and start her on Entresto twice daily.  She is scheduled for labs on the 19th.  We will bring her back in 1 month with plans to add spironolactone.  Wilder Glade can be added last.  3.  Essential hypertension: Blood pressure is controlled but I change her medications in order to treat her heart failure.  4.  Hyperlipidemia: Continue high-dose atorvastatin. I reviewed most recent lipid profile in August which showed an LDL of 66.  5.  Tobacco use: She cut down smoking to 2 cigarettes a day.  6.  Occluded right radial artery: This was noted on most recent cardiac catheterization and verified with ultrasound.  This seems to be chronic and asymptomatic.  She does have a dominant ulnar artery that was widely patent.  This does not require further management but precludes future catheterization via the right radial artery.    Disposition:   FU with me in 1 month.  Signed,  Susan Sacramento, MD  11/16/2020 2:38 PM    [facility]

## 2020-11-17 ENCOUNTER — Telehealth: Payer: Self-pay

## 2020-11-17 NOTE — Telephone Encounter (Signed)
KEY: [id number] [email or web address] initiated a prior auth for Entresto 26-26mg  tablets. PA completed. Waiting for [facility]'s determination.  Spoke with Naima at [facility] to correct one of the answers RE: ACE inhibitors on PA that was incorrectly answered. She states she noted the correction for pharmacy review.

## 2020-11-20 NOTE — Telephone Encounter (Signed)
Left message requesting to have patient call back RE: allergy to ACE inhibitors. Need to know what type of reaction she has. Prior Auth was denied due to concern over this.

## 2020-11-20 NOTE — Telephone Encounter (Signed)
Please advise if OK to remove ACE inhibitors from her list of allergies. (See below) Thank you!

## 2020-11-20 NOTE — Telephone Encounter (Signed)
Patient calling to check on status of Susan Mcmillan Please advise when completed

## 2020-11-20 NOTE — Telephone Encounter (Signed)
Spoke with patient to discuss what type of reaction she had in the past to ACE inhibitors. She states she saw this on her list but thought it may have been a "type of inhaler" she used in the past. She states she does not recall ever having any reaction to this class of medications and is requesting to have it removed from her list of allergies.   Spoke with Lovena Le at [facility] to appeal the denial for [place]. Advised her that patient states she does not have an allergy to ACE inhibitors. She states medication will be approved and the patient will be able to get her medication as soon ads the Appeals Team has time to authorize it which has been marked urgent (within the next 72 hours).  Patient has been informed.

## 2020-11-21 DIAGNOSIS — K219 Gastro-esophageal reflux disease without esophagitis: Secondary | ICD-10-CM | POA: Diagnosis not present

## 2020-11-21 DIAGNOSIS — K582 Mixed irritable bowel syndrome: Secondary | ICD-10-CM | POA: Diagnosis not present

## 2020-11-21 NOTE — Telephone Encounter (Signed)
Yes we can remove ACE inhibitor's from allergies.

## 2020-11-22 NOTE — Telephone Encounter (Signed)
Spoke with patient and she said during the night she was thinking about this. She reports taking something over 20 years ago but does not remember the medication. Discussed that since she does not know which medication she took that we could either leave it off or we could put it back in. She wants to just leave it off for now and if in the future we need to add back then she will review with provider. Reviewed that if she should have any future medication problems then we can add back if needed. She had no further questions or concerns at this time.

## 2020-11-28 ENCOUNTER — Inpatient Hospital Stay: Payer: PPO | Attending: Oncology

## 2020-11-28 DIAGNOSIS — R251 Tremor, unspecified: Secondary | ICD-10-CM | POA: Insufficient documentation

## 2020-11-28 DIAGNOSIS — Z880 Allergy status to penicillin: Secondary | ICD-10-CM | POA: Diagnosis not present

## 2020-11-28 DIAGNOSIS — R531 Weakness: Secondary | ICD-10-CM | POA: Insufficient documentation

## 2020-11-28 DIAGNOSIS — Z85828 Personal history of other malignant neoplasm of skin: Secondary | ICD-10-CM | POA: Insufficient documentation

## 2020-11-28 DIAGNOSIS — Z923 Personal history of irradiation: Secondary | ICD-10-CM | POA: Insufficient documentation

## 2020-11-28 DIAGNOSIS — Z720 Tobacco use: Secondary | ICD-10-CM

## 2020-11-28 DIAGNOSIS — Z79899 Other long term (current) drug therapy: Secondary | ICD-10-CM | POA: Insufficient documentation

## 2020-11-28 DIAGNOSIS — Z881 Allergy status to other antibiotic agents status: Secondary | ICD-10-CM | POA: Insufficient documentation

## 2020-11-28 DIAGNOSIS — Z9049 Acquired absence of other specified parts of digestive tract: Secondary | ICD-10-CM | POA: Insufficient documentation

## 2020-11-28 DIAGNOSIS — R0602 Shortness of breath: Secondary | ICD-10-CM | POA: Diagnosis not present

## 2020-11-28 DIAGNOSIS — Z885 Allergy status to narcotic agent status: Secondary | ICD-10-CM | POA: Diagnosis not present

## 2020-11-28 DIAGNOSIS — E785 Hyperlipidemia, unspecified: Secondary | ICD-10-CM | POA: Diagnosis not present

## 2020-11-28 DIAGNOSIS — I119 Hypertensive heart disease without heart failure: Secondary | ICD-10-CM | POA: Insufficient documentation

## 2020-11-28 DIAGNOSIS — Z818 Family history of other mental and behavioral disorders: Secondary | ICD-10-CM | POA: Diagnosis not present

## 2020-11-28 DIAGNOSIS — Z87442 Personal history of urinary calculi: Secondary | ICD-10-CM | POA: Diagnosis not present

## 2020-11-28 DIAGNOSIS — Z83518 Family history of other specified eye disorder: Secondary | ICD-10-CM | POA: Insufficient documentation

## 2020-11-28 DIAGNOSIS — I272 Pulmonary hypertension, unspecified: Secondary | ICD-10-CM | POA: Insufficient documentation

## 2020-11-28 DIAGNOSIS — Z8572 Personal history of non-Hodgkin lymphomas: Secondary | ICD-10-CM | POA: Diagnosis not present

## 2020-11-28 DIAGNOSIS — F17210 Nicotine dependence, cigarettes, uncomplicated: Secondary | ICD-10-CM | POA: Insufficient documentation

## 2020-11-28 DIAGNOSIS — Z8249 Family history of ischemic heart disease and other diseases of the circulatory system: Secondary | ICD-10-CM | POA: Diagnosis not present

## 2020-11-28 DIAGNOSIS — Z8579 Personal history of other malignant neoplasms of lymphoid, hematopoietic and related tissues: Secondary | ICD-10-CM

## 2020-11-28 DIAGNOSIS — R2232 Localized swelling, mass and lump, left upper limb: Secondary | ICD-10-CM | POA: Insufficient documentation

## 2020-11-28 DIAGNOSIS — Z808 Family history of malignant neoplasm of other organs or systems: Secondary | ICD-10-CM | POA: Diagnosis not present

## 2020-11-28 DIAGNOSIS — Z8349 Family history of other endocrine, nutritional and metabolic diseases: Secondary | ICD-10-CM | POA: Insufficient documentation

## 2020-11-28 DIAGNOSIS — Z888 Allergy status to other drugs, medicaments and biological substances status: Secondary | ICD-10-CM | POA: Diagnosis not present

## 2020-11-28 LAB — CBC WITH DIFFERENTIAL/PLATELET
Abs Immature Granulocytes: 0.03 10*3/uL (ref 0.00–0.07)
Basophils Absolute: 0.1 10*3/uL (ref 0.0–0.1)
Basophils Relative: 1 %
Eosinophils Absolute: 0.2 10*3/uL (ref 0.0–0.5)
Eosinophils Relative: 3 %
HCT: 43.3 % (ref 36.0–46.0)
Hemoglobin: 14.3 g/dL (ref 12.0–15.0)
Immature Granulocytes: 1 %
Lymphocytes Relative: 19 %
Lymphs Abs: 1.2 10*3/uL (ref 0.7–4.0)
MCH: 30.7 pg (ref 26.0–34.0)
MCHC: 33 g/dL (ref 30.0–36.0)
MCV: 92.9 fL (ref 80.0–100.0)
Monocytes Absolute: 0.6 10*3/uL (ref 0.1–1.0)
Monocytes Relative: 10 %
Neutro Abs: 4.2 10*3/uL (ref 1.7–7.7)
Neutrophils Relative %: 66 %
Platelets: 271 10*3/uL (ref 150–400)
RBC: 4.66 MIL/uL (ref 3.87–5.11)
RDW: 13.5 % (ref 11.5–15.5)
WBC: 6.3 10*3/uL (ref 4.0–10.5)
nRBC: 0 % (ref 0.0–0.2)

## 2020-11-28 LAB — COMPREHENSIVE METABOLIC PANEL
ALT: 19 U/L (ref 0–44)
AST: 21 U/L (ref 15–41)
Albumin: 3.9 g/dL (ref 3.5–5.0)
Alkaline Phosphatase: 91 U/L (ref 38–126)
Anion gap: 12 (ref 5–15)
BUN: 16 mg/dL (ref 8–23)
CO2: 26 mmol/L (ref 22–32)
Calcium: 9.5 mg/dL (ref 8.9–10.3)
Chloride: 98 mmol/L (ref 98–111)
Creatinine, Ser: 0.97 mg/dL (ref 0.44–1.00)
GFR, Estimated: >60 mL/min (ref >60)
Glucose, Bld: 89 mg/dL (ref 70–99)
Potassium: 4.2 mmol/L (ref 3.5–5.1)
Sodium: 136 mmol/L (ref 135–145)
Total Bilirubin: 0.6 mg/dL (ref 0.3–1.2)
Total Protein: 7 g/dL (ref 6.5–8.1)

## 2020-11-28 LAB — LACTATE DEHYDROGENASE: LDH: 158 U/L (ref 98–192)

## 2020-11-29 ENCOUNTER — Telehealth: Payer: Self-pay | Admitting: Oncology

## 2020-11-29 ENCOUNTER — Other Ambulatory Visit: Payer: Self-pay

## 2020-11-29 ENCOUNTER — Ambulatory Visit
Admission: RE | Admit: 2020-11-29 | Discharge: 2020-11-29 | Disposition: A | Discharge status: Home / Self Care | Payer: PPO | Source: Ambulatory Visit | Attending: Oncology | Admitting: Oncology

## 2020-11-29 DIAGNOSIS — I619 Nontraumatic intracerebral hemorrhage, unspecified: Secondary | ICD-10-CM | POA: Diagnosis not present

## 2020-11-29 DIAGNOSIS — C8590 Non-Hodgkin lymphoma, unspecified, unspecified site: Secondary | ICD-10-CM | POA: Diagnosis not present

## 2020-11-29 DIAGNOSIS — Z8579 Personal history of other malignant neoplasms of lymphoid, hematopoietic and related tissues: Secondary | ICD-10-CM

## 2020-11-29 DIAGNOSIS — G9389 Other specified disorders of brain: Secondary | ICD-10-CM | POA: Diagnosis not present

## 2020-11-29 MED ORDER — GADOBUTROL 1 MMOL/ML IV SOLN
7.5000 mL | Freq: Once | INTRAVENOUS | Status: AC | PRN
  Administered 2020-11-29: 7.5 mL via INTRAVENOUS

## 2020-11-29 NOTE — Telephone Encounter (Signed)
Left VM with patient to see if she could come in earlier to her appt on 7/22 ( to fill in holes in MD schedule).

## 2020-11-30 ENCOUNTER — Telehealth: Payer: Self-pay | Admitting: Oncology

## 2020-11-30 ENCOUNTER — Telehealth: Payer: Self-pay

## 2020-11-30 NOTE — Telephone Encounter (Signed)
Spoke with patient about coming in earlier for appointment tomorrow (7/22). She stated that she would "try" to and would let us know if possible.

## 2020-11-30 NOTE — Telephone Encounter (Signed)
-----   Message from Wellington Hampshire, MD sent at 11/30/2020  7:56 AM EDT ----- Regarding: RE: Patients f/u labs after starying Entresto Normal renal function and electrolytes.   ----- Message ----- From: Lamar Laundry, RN Sent: 11/29/2020   9:06 AM EDT To: Wellington Hampshire, MD Subject: Patients f/u labs after starying Leron Croak Dr. Fletcher Anon,  Patient did have labs drawn at the cancer center yesterday. You started her on Entresto on 11/16/20.

## 2020-12-01 ENCOUNTER — Other Ambulatory Visit: Payer: PPO

## 2020-12-01 ENCOUNTER — Inpatient Hospital Stay: Payer: PPO | Admitting: Oncology

## 2020-12-04 ENCOUNTER — Inpatient Hospital Stay (HOSPITAL_BASED_OUTPATIENT_CLINIC_OR_DEPARTMENT_OTHER): Payer: PPO | Admitting: Oncology

## 2020-12-04 ENCOUNTER — Encounter: Payer: Self-pay | Admitting: Oncology

## 2020-12-04 VITALS — BP 117/65 | HR 56 | Temp 98.2°F | Wt 160.5 lb

## 2020-12-04 DIAGNOSIS — Z8579 Personal history of other malignant neoplasms of lymphoid, hematopoietic and related tissues: Secondary | ICD-10-CM | POA: Diagnosis not present

## 2020-12-04 DIAGNOSIS — Z720 Tobacco use: Secondary | ICD-10-CM

## 2020-12-04 DIAGNOSIS — Z8572 Personal history of non-Hodgkin lymphomas: Secondary | ICD-10-CM | POA: Diagnosis not present

## 2020-12-04 NOTE — Progress Notes (Signed)
Patient here for follow up. She reports that she is having problems with heart and is now being followed by cardiology.

## 2020-12-04 NOTE — Progress Notes (Signed)
Hematology/Oncology follow up  note [facility] [phone] [phone]) [phone]   Patient Care Team: Marinda Elk, MD as PCP - General (Physician Assistant) Wellington Hampshire, MD as PCP - Cardiology (Cardiology)  REFERRING PROVIDER: Marinda Elk, MD  REASON FOR VISIT:  Follow up for history of  dural based lymphoma  HISTORY OF PRESENTING ILLNESS:  Susan Mcmillan is a  75 y.o.  female with PMH listed below who was referred to me for evaluation of lymphoma  Patient previously followed up with [facility] oncology. Extensive medical record, image, pathology report review via care everywhere were performed by me. Patient initially presented with intermittent right arm/neck tingling, weakness dating back to 2016.  Initial brain imaging demonstrated a left side dural based lesion which was not most consistent with meningioma.  This was monitored.  In the fall 2018, these tingling episodes worsened in frequency and severity, going down on her trunk into her right leg.  Patient saw her primary care physician who performed another MRI which showed larger dural based lesion.  Patient was evaluated by Dr. Lacinda Axon who took her to the [facility] on 03/27/2017 where she underwent a subtotal resection.  She tolerated surgery well. 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 No evidence of MALT1 (18q21) rearrangement   further staging included a PET scan and the bone marrow biopsy were negative. Patient finished adjuvant radiation in February 2019.  She followed up with [facility] oncology Dr. Wynetta Emery who recommends surveillance with MRI.  Patient was last seen by Dr. Wynetta Emery on 12/04/2017 and at that time she was recommended to repeat MRI in 4 months and follow-up in the clinic. Patient reports that he was never called for an appointment lost follow-up  since then.  Patient reports doing well currently.  Not taking any antiseizure medications.  Patient was seen by primary care physician and would like to be referred to cancer center for further management. She reports that chronic right side numbness and tingling/and weakness has been better since his surgery however has not returned to her baseline.  She needs help opening the cap of her water bottle. She reports that sometimes she does not have steady gait.  Otherwise no new complaints. Denies any headache, shortness of breath, cough, unintentional weight loss, fever or chills, night sweats.  She was accompanied by husband today.  She tells me that Reports chronic history of IBS with weight fluctuation.  #Cancer screening, patient smokes couple of cigarettes a day, longstanding smoking history since age of 75.  Has been with lung cancer clinic.   #She drinks 8 ounces of Budweiser every afternoon.  Current someday smoker, a few cigarettes daily. 27.5 pack year smoking    04/02/2018 bilateral screening mammogram-BI-RADS 1 12/04/2017 MRI brain with and without contrast Equivalent and nonspecific increased cranial bone enhancement at the craniotomy site compared to 07/31/2017.  Small left frontal subdural collection subjacent to craniotomy site has almost completely resolved.  Stable small subdural enhancement subjacent to the craniotomy site.  No obvious new intra-axial abnormality. 07/31/2017 MRI with and without contrast 01/17/2017 MRI brain with and without contrast Interval resection of the dural based enhancing mass on the left frontal lobe, residual same linear dural enhancement seen along the left anterior and parietal lobes, could represent residual lymphoma.  Contacted postsurgical changes with small subdural collection along the left frontal lobe. Extra axial enhancing mass overlying the left hemisphere measuring  up to 12 mm in thickness compatible with meningioma.  This shows significant  enlargement since 2016.  There is mass-effect on the overlying cortex but no shift of the midline structures.  No brain edema.  08/31/2018 MRI brain with and without contrast showed status post resection of extra axial left convexity tumor without residual lesion.  Otherwise normal brain MRI.  INTERVAL HISTORY Susan Mcmillan is a 75 y.o. female who has above history reviewed by me today presents for follow up visit for history of CNS lymphoma. Problems and complaints are listed below: She reports doing well at baseline.  Overdue to CT lung cancer screening.  Chronic SOB with exertion.  Smokes daily, 2 puff at each time, " 1 cigaret lasts the whole day".  She has chronic balancing issue which is at baseline. She also has right hand tremor.  Denies any fever, chills, night sweats, unintentional weight loss.   Review of Systems  Constitutional:  Negative for appetite change, chills, fatigue and fever.  HENT:   Negative for hearing loss and voice change.   Eyes:  Negative for eye problems.  Respiratory:  Positive for shortness of breath. Negative for chest tightness and cough.   Cardiovascular:  Negative for chest pain.  Gastrointestinal:  Negative for abdominal distention, abdominal pain and blood in stool.  Endocrine: Negative for hot flashes.  Genitourinary:  Negative for difficulty urinating and frequency.   Musculoskeletal:  Negative for arthralgias.       Chronic right-sided weakness/tingling tremor  Skin:  Negative for itching and rash.  Neurological:  Negative for extremity weakness, headaches, seizures and speech difficulty.  Hematological:  Negative for adenopathy.  Psychiatric/Behavioral:  Negative for confusion.    MEDICAL HISTORY:  Past Medical History:  Diagnosis Date   Anxiety    CAD (coronary artery disease)    a. 11/2015 MV: mod mid-distal ant ischemia, EF 58% w/ apical HK;  b. 11/2015 Cath: LM nl, LAD 80/160m(fills via collats from RPDA & D1), D1 60ost, LCX small,  nl, RCA 50p/m, RPDA nl, RPAV min irregs, RPl1/2/3 min irregs, EF 50-55%.   Cancer of brain ([facility] 03/2017   Dental bridge present    bilateral top.  Permanent retainer - bottom.   Dysuria    History of kidney stones    Hyperlipemia    Hypertension    Hypertensive heart disease    IBS (irritable bowel syndrome)    Ischemic cardiomyopathy    a. 11/2015 V gram: EF 50-55%; b. 09/2020 Echo: EF 35-40%, ant, antsept, apical HK. Gr1 DD. Nl RV fxn. Mild MR.   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 Still smoking ~ 1 cigarette/day.   Urethral caruncle    Vertigo    Vulvar adhesions 10/06/2013   perianal skin bridge noted at posterior fourchette and region of WLE surgical site   Vulvar leukoplakia    Vulvar pain     SURGICAL HISTORY: Past Surgical History:  Procedure Laterality Date   ABDOMINAL HERNIA REPAIR  2014   Dr. WRochel Brome  ABDOMINAL HYSTERECTOMY  1978   APPENDECTOMY     BRAIN SURGERY     CARDIAC CATHETERIZATION Left 12/07/2015   Procedure: Left Heart Cath and  Coronary Angiography;  Surgeon: Wellington Hampshire, MD;  Location: [facility];  Service: Cardiovascular;  Laterality: Left;   CATARACT EXTRACTION W/PHACO Right 05/28/2018   Procedure: CATARACT EXTRACTION PHACO AND INTRAOCULAR LENS PLACEMENT ([place]) RIGHT;  Surgeon: Marchia Meiers, MD;  Location: [facility];  Service: Ophthalmology;  Laterality: Right;  [place] 00:56 CDE 7.17 Fluid pack Lot # [id number]   CATARACT EXTRACTION W/PHACO Left 01/06/2019   Procedure: CATARACT EXTRACTION PHACO AND INTRAOCULAR LENS PLACEMENT (I[facility]) LEFT  00:50.3  19.3%  9.71;  Surgeon: Leandrew Koyanagi, MD;  Location: [facility];  Service: Ophthalmology;  Laterality: Left;   CERVICAL BIOPSY     Dr. Enzo Bi    CHOLECYSTECTOMY     COLONOSCOPY WITH PROPOFOL N/A 02/09/2019   Procedure: COLONOSCOPY WITH PROPOFOL;  Surgeon: Lollie Sails, MD;  Location: [facility] ENDOSCOPY;  Service: Endoscopy;  Laterality: N/A;   ESOPHAGOGASTRODUODENOSCOPY (EGD) WITH PROPOFOL N/A 02/09/2019   Procedure: ESOPHAGOGASTRODUODENOSCOPY (EGD) WITH PROPOFOL;  Surgeon: Lollie Sails, MD;  Location: [facility] ENDOSCOPY;  Service: Endoscopy;  Laterality: N/A;   MOHS SURGERY Right    Leg   RIGHT/LEFT HEART CATH AND CORONARY ANGIOGRAPHY N/A 10/23/2020   Procedure: RIGHT/LEFT HEART CATH AND CORONARY ANGIOGRAPHY;  Surgeon: Wellington Hampshire, MD;  Location: [facility];  Service: Cardiovascular;  Laterality: N/A;   SKIN BIOPSY     wide local excision  07/2013   vin 3 w/ margins involoved    SOCIAL HISTORY: Social History   Socioeconomic History   Marital status: Married    Spouse name: Geneticist, molecular   Number of children: 2   Years of education: Not on file   Highest education level: Not on file  Occupational History   Occupation: retired  Tobacco Use   Smoking status: Some Days    Packs/day: 0.25    Years: 55.00    Pack years: 13.75    Types: Cigarettes    Last attempt to quit: 12/07/2015    Years since quitting: 4.9   Smokeless tobacco: Never   Tobacco comments:    smokes about 2 cigarettes a day  Vaping Use   Vaping Use: Never used  Substance and Sexual Activity   Alcohol use: Yes    Comment: 8 oz Bud Light with supper daily   Drug use: No   Sexual activity: Not Currently  Other Topics Concern   Not on file  Social History Narrative   Lives in [place]. Previously worked [employer]. Husband with dementia. Children 2 sons.   Social Determinants of Health   Financial Resource Strain: Not on file  Food Insecurity: Not on file  Transportation Needs: Not on file  Physical Activity: Not on file  Stress: Not on file  Social Connections: Not on file  Intimate Partner Violence: Not on file    FAMILY  HISTORY: Family History  Problem Relation Age of Onset   Hyperlipidemia Sister    Hypertension Sister    Macular degeneration Sister    Dementia Sister    Congestive Heart Failure Mother    Bone cancer Father    Cancer Father        blood cancer   Diabetes Neg Hx    Heart disease Neg Hx     ALLERGIES:  is allergic to tape, codeine, demerol [meperidine], erythromycin, iodinated diagnostic agents, other, and penicillins.  MEDICATIONS:  Current Outpatient Medications  Medication Sig Dispense Refill   atorvastatin (LIPITOR) 80 MG tablet Take 1 tablet (80 mg total) by mouth in the  morning.     Cholecalciferol (VITAMIN D3) 125 MCG (5000 UT) TABS Take 5,000 Units by mouth in the morning.     Cyanocobalamin (VITAMIN B-12) 5000 MCG SUBL Place 5,000 mcg under the tongue daily as needed (low energy (feeling poorly)).     diphenhydrAMINE (BENADRYL) 25 MG tablet Take 25 mg by mouth at bedtime.     FLUoxetine (PROZAC) 20 MG capsule Take 20 mg by mouth daily.     folic acid (FOLVITE) 916 MCG tablet Take 400 mcg by mouth in the morning.     hyoscyamine (LEVSIN SL) 0.125 MG SL tablet Place 0.125 mg under the tongue every 6 (six) hours as needed. Place 1 tablet under the tongue every 6 hours as needed for cramping for up to 10 days     metoprolol succinate (TOPROL-XL) 50 MG 24 hr tablet Take 50 mg by mouth in the morning.     pantoprazole (PROTONIX) 40 MG tablet Take 40 mg by mouth 2 (two) times daily.     sacubitril-valsartan (ENTRESTO) 24-26 MG Take 1 tablet by mouth 2 (two) times daily. 60 tablet 5   tetrahydrozoline 0.05 % ophthalmic solution Place 1 drop into both eyes 3 (three) times daily as needed (dry eyes).     NONFORMULARY OR COMPOUNDED ITEM Apply 1 application topically 2 (two) times daily as needed (cancer spots). FLUOROURACIL 5% + CALCIPOTRIENE 0.005% (Patient not taking: Reported on 12/04/2020)     No current facility-administered medications for this visit.     PHYSICAL  EXAMINATION: ECOG PERFORMANCE STATUS: 1 - Symptomatic but completely ambulatory There were no vitals filed for this visit.  Filed Weights   12/04/20 1328  Weight: 160 lb 8 oz (72.8 kg)    Physical Exam Constitutional:      General: She is not in acute distress. HENT:     Head: Normocephalic and atraumatic.  Eyes:     General: No scleral icterus.    Pupils: Pupils are equal, round, and reactive to light.  Cardiovascular:     Rate and Rhythm: Normal rate and regular rhythm.     Heart sounds: Normal heart sounds.  Pulmonary:     Effort: Pulmonary effort is normal. No respiratory distress.     Breath sounds: No wheezing.     Comments: Decreased breath sound bilaterally Abdominal:     General: Bowel sounds are normal. There is no distension.     Palpations: Abdomen is soft. There is no mass.     Tenderness: There is no abdominal tenderness.  Musculoskeletal:        General: No deformity. Normal range of motion.     Cervical back: Normal range of motion and neck supple.  Skin:    General: Skin is warm and dry.     Findings: No erythema or rash.  Neurological:     Mental Status: She is alert and oriented to person, place, and time. Mental status is at baseline.     Cranial Nerves: No cranial nerve deficit.     Coordination: Coordination normal.  Psychiatric:        Mood and Affect: Mood normal.    RADIOGRAPHIC STUDIES: I have personally reviewed the radiological images as listed and agreed with the findings in the report.  CMP Latest Ref Rng & Units 11/28/2020  Glucose 70 - 99 mg/dL 89  BUN 8 - 23 mg/dL 16  Creatinine 0.44 - 1.00 mg/dL 0.97  Sodium 135 - 145 mmol/L 136  Potassium 3.5 - 5.1 mmol/L  4.2  Chloride 98 - 111 mmol/L 98  CO2 22 - 32 mmol/L 26  Calcium 8.9 - 10.3 mg/dL 9.5  Total Protein 6.5 - 8.1 g/dL 7.0  Total Bilirubin 0.3 - 1.2 mg/dL 0.6  Alkaline Phos 38 - 126 U/L 91  AST 15 - 41 U/L 21  ALT 0 - 44 U/L 19   CBC Latest Ref Rng & Units 11/28/2020  WBC  4.0 - 10.5 K/uL 6.3  Hemoglobin 12.0 - 15.0 g/dL 14.3  Hematocrit 36.0 - 46.0 % 43.3  Platelets 150 - 400 K/uL 271    LABORATORY DATA:  I have reviewed the data as listed Lab Results  Component Value Date   WBC 6.3 11/28/2020   HGB 14.3 11/28/2020   HCT 43.3 11/28/2020   MCV 92.9 11/28/2020   PLT 271 11/28/2020   Recent Labs    10/13/20 1553 11/28/20 1356  NA 141 136  K 4.4 4.2  CL 99 98  CO2 23 26  GLUCOSE 88 89  BUN 12 16  CREATININE 0.82 0.97  CALCIUM 9.4 9.5  GFRNONAA  --  >60  PROT  --  7.0  ALBUMIN  --  3.9  AST  --  21  ALT  --  19  ALKPHOS  --  91  BILITOT  --  0.6    Iron/TIBC/Ferritin/ %Sat    Component Value Date/Time   FERRITIN 24.0 01/14/2012 1434    03/27/2017 Brain tumor biopsies showed mature B cell lymphoma with plasmacytic differentiation.  Favoring marginal zone lymphoma  RADIOGRAPHIC STUDIES: I have personally reviewed the radiological images as listed and agreed with the findings in the report. MR Brain W Wo Contrast  Result Date: 11/29/2020 CLINICAL DATA:  Lymphoma follow-up EXAM: MRI HEAD WITHOUT AND WITH CONTRAST TECHNIQUE: Multiplanar, multiecho pulse sequences of the brain and surrounding structures were obtained without and with intravenous contrast. CONTRAST:  7.85m GADAVIST GADOBUTROL 1 MMOL/ML IV SOLN COMPARISON:  11/29/2019 FINDINGS: Brain: No acute infarct, mass effect or extra-axial collection. No acute or chronic hemorrhage. Normal white matter signal, parenchymal volume and CSF spaces. The midline structures are normal. There is mild dural thickening underlying the remote left craniotomy site. Otherwise, no abnormal contrast enhancement. Vascular: Major flow voids are preserved. Skull and upper cervical spine: Old left craniotomy. Sinuses/Orbits:No paranasal sinus fluid levels or advanced mucosal thickening. No mastoid or middle ear effusion. Normal orbits. IMPRESSION: Mild dural thickening underlying the remote left craniotomy site.  Otherwise normal brain MRI. Electronically Signed   By: KUlyses JarredM.D.   On: 11/29/2020 15:51   CARDIAC CATHETERIZATION  Result Date: 10/23/2020  Ost 1st Diag to 1st Diag lesion is 60% stenosed.  Prox RCA to Mid RCA lesion is 50% stenosed.  Mid LAD-1 lesion is 80% stenosed.  Mid LAD-2 lesion is 100% stenosed.  1st Diag lesion is 60% stenosed.  1.  No significant change since 2017.  Chronically occluded mid LAD with collaterals from a large first diagonal as well as right coronary artery.  The first diagonal has moderate proximal stenosis which has been stable.  RCA has diffuse 50% disease which also has been stable. 2.  Left ventricular angiography was not performed.  EF was moderately reduced by echo. 3.  Right heart catheterization showed normal right and left-sided filling pressures, minimal pulmonary hypertension and severely reduced cardiac output. Recommendations: No indication for revascularization.  Recommend optimizing heart failure medications. [facility] spironolactone and likely adding an  SGLT2 inhibitor.   MM 3D SCREEN BREAST BILATERAL  Result Date: 09/25/2020 CLINICAL DATA:  Screening. EXAM: DIGITAL SCREENING BILATERAL MAMMOGRAM WITH TOMOSYNTHESIS AND CAD TECHNIQUE: Bilateral screening digital craniocaudal and mediolateral oblique mammograms were obtained. Bilateral screening digital breast tomosynthesis was performed. The images were evaluated with computer-aided detection. COMPARISON:  Previous exam(s). ACR Breast Density Category c: The breast tissue is heterogeneously dense, which may obscure small masses. FINDINGS: There are no findings suspicious for malignancy. The images were evaluated with computer-aided detection. IMPRESSION: No mammographic evidence of malignancy. A result letter of this screening mammogram will be mailed directly to the patient. RECOMMENDATION: Screening mammogram in one year. (Code:SM-B-01Y) BI-RADS CATEGORY   1: Negative. Electronically Signed   By: Dorise Bullion III M.D   On: 09/25/2020 16:53   ECHOCARDIOGRAM COMPLETE  Result Date: 09/28/2020    ECHOCARDIOGRAM REPORT   Patient Name:   IRINI LEET Date of Exam: 09/28/2020 Medical Rec #:  [id number]        Height:       62.0 in Accession #:    [id number]       Weight:       162.2 lb Date of Birth:  01-10-1946       BSA:          1.749 m Patient Age:    45 years         BP:           130/72 mmHg Patient Gender: F                HR:           62 bpm. Exam Location:  [facility] Procedure: 2D Echo, Cardiac Doppler, Color Doppler and Intracardiac            Opacification Agent Indications:    R06.02 SOB  History:        Patient has no prior history of Echocardiogram examinations.                 Cardiomyopathy, CAD, Signs/Symptoms:Shortness of Breath and                 Hypertensive Heart Disease; Risk Factors:Dyslipidemia and                 Current Smoker.  Sonographer:    Pilar Jarvis RDMS, RVT, RDCS Referring Phys: [place]  1. Left ventricular ejection fraction, by estimation, is 35 to 40%. The left ventricle has moderately decreased function. The left ventricle demonstrates regional wall motion abnormalities (anterior, anteroseptal, apical hypokinesis). Left ventricular diastolic parameters are consistent with Grade I diastolic dysfunction (impaired relaxation).  2. Right ventricular systolic function is normal. The right ventricular size is normal. Tricuspid regurgitation signal is inadequate for assessing PA pressure.  3. The mitral valve is normal in structure. Mild mitral valve regurgitation. FINDINGS  Left Ventricle: Left ventricular ejection fraction, by estimation, is 35 to 40%. The left ventricle has moderately decreased function. The left ventricle demonstrates regional wall motion abnormalities. Definity contrast agent was given IV to delineate the left ventricular endocardial borders. The left ventricular internal cavity size  was normal in size. There is no left ventricular hypertrophy. Left ventricular diastolic parameters are consistent with Grade I diastolic dysfunction (impaired relaxation). Right Ventricle: The right ventricular size is normal. No increase in right ventricular wall thickness. Right ventricular systolic function is normal. Tricuspid regurgitation signal is inadequate for assessing PA pressure. Left Atrium: Left atrial size was  normal in size. Right Atrium: Right atrial size was normal in size. Pericardium: There is no evidence of pericardial effusion. Mitral Valve: The mitral valve is normal in structure. Mild mitral valve regurgitation. No evidence of mitral valve stenosis. Tricuspid Valve: The tricuspid valve is normal in structure. Tricuspid valve regurgitation is not demonstrated. No evidence of tricuspid stenosis. Aortic Valve: The aortic valve is normal in structure. Aortic valve regurgitation is not visualized. Mild aortic valve sclerosis is present, with no evidence of aortic valve stenosis. Aortic valve mean gradient measures 2.0 mmHg. Aortic valve peak gradient measures 4.7 mmHg. Aortic valve area, by VTI measures 1.95 cm. Pulmonic Valve: The pulmonic valve was normal in structure. Pulmonic valve regurgitation is not visualized. No evidence of pulmonic stenosis. Aorta: The aortic root is normal in size and structure. Venous: The inferior vena cava is normal in size with greater than 50% respiratory variability, suggesting right atrial pressure of 3 mmHg. IAS/Shunts: No atrial level shunt detected by color flow Doppler.  LEFT VENTRICLE PLAX 2D LVIDd:         4.80 cm  Diastology LVIDs:         4.00 cm  LV e' medial:    3.59 cm/s LV PW:         1.00 cm  LV E/e' medial:  12.6 LV IVS:        1.00 cm  LV e' lateral:   3.05 cm/s LVOT diam:     1.80 cm  LV E/e' lateral: 14.9 LV SV:         43 LV SV Index:   25 LVOT Area:     2.54 cm  RIGHT VENTRICLE            IVC RV S prime:     9.79 cm/s  IVC diam: 0.80 cm  TAPSE (M-mode): 2.1 cm LEFT ATRIUM             Index LA diam:        3.80 cm 2.17 cm/m LA Vol (A2C):   42.8 ml 24.47 ml/m LA Vol (A4C):   37.3 ml 21.33 ml/m LA Biplane Vol: 41.1 ml 23.50 ml/m  AORTIC VALVE                   PULMONIC VALVE AV Area (Vmax):    1.89 cm    PV Vmax:       0.75 m/s AV Area (Vmean):   1.75 cm    PV Peak grad:  2.3 mmHg AV Area (VTI):     1.95 cm AV Vmax:           108.00 cm/s AV Vmean:          70.100 cm/s AV VTI:            0.221 m AV Peak Grad:      4.7 mmHg AV Mean Grad:      2.0 mmHg LVOT Vmax:         80.30 cm/s LVOT Vmean:        48.300 cm/s LVOT VTI:          0.169 m LVOT/AV VTI ratio: 0.76  AORTA Ao Root diam: 3.00 cm Ao Asc diam:  2.90 cm Ao Arch diam: 2.5 cm MITRAL VALVE MV Area (PHT): 2.28 cm    SHUNTS MV Decel Time: 333 msec    Systemic VTI:  0.17 m MV E velocity: 45.40 cm/s  Systemic Diam: 1.80 cm MV A velocity: 59.10 cm/s MV  E/A ratio:  0.77 Ida Rogue MD Electronically signed by Ida Rogue MD Signature Date/Time: 09/28/2020/8:52:30 PM    Final      ASSESSMENT & PLAN:  1. History of lymphoma   2. Tobacco abuse    History of dural lymphoma Labs are reviewed and discussed with patient.No new symptoms # Surveillance brain MRI was independently reviewed by me and discussed with patient. No evidence of recurrence of lymphoma. Stable counts. Normal LDH This is close to 3 years post lymphoma surgery.  I recommend patient to repeat MRI brain in 1 year.   #Tobacco use, smoke cessation discussed with patient.   Will refer patient to establish with [facility] lung cancer screening program.   #Tremor, right hand.  Possible essential tremor.  Continue to monitor.  Orders Placed This Encounter  Procedures   MR Brain W Wo Contrast    Standing Status:   Future    Standing Expiration Date:   12/04/2021    Order Specific Question:   If indicated for the ordered procedure, I authorize the administration of contrast media per Radiology protocol    Answer:   Yes     Order Specific Question:   What is the patient's sedation requirement?    Answer:   No Sedation    Order Specific Question:   Does the patient have a pacemaker or implanted devices?    Answer:   No    Order Specific Question:   Use SRS Protocol?    Answer:   Yes    Order Specific Question:   Preferred imaging location?    Answer:   [facility] (table limit - 550lbs)   CBC with Differential/Platelet    Standing Status:   Future    Standing Expiration Date:   12/04/2021   Comprehensive metabolic panel    Standing Status:   Future    Standing Expiration Date:   12/04/2021   Lactate dehydrogenase    Standing Status:   Future    Standing Expiration Date:   12/04/2021   Ambulatory Referral for Lung Cancer Scre    Referral Priority:   Routine    Referral Type:   Consultation    Referral Reason:   Specialty Services Required    Number of Visits Requested:   1    All questions were answered. The patient knows to call the clinic with any problems questions or concerns.  Return of visit: 12 months.   Earlie Server, MD, PhD Hematology Oncology [place] at [facility] 12/04/2020

## 2020-12-05 DIAGNOSIS — L821 Other seborrheic keratosis: Secondary | ICD-10-CM | POA: Diagnosis not present

## 2020-12-05 DIAGNOSIS — L720 Epidermal cyst: Secondary | ICD-10-CM | POA: Diagnosis not present

## 2020-12-05 DIAGNOSIS — D492 Neoplasm of unspecified behavior of bone, soft tissue, and skin: Secondary | ICD-10-CM | POA: Diagnosis not present

## 2020-12-05 DIAGNOSIS — Z1283 Encounter for screening for malignant neoplasm of skin: Secondary | ICD-10-CM | POA: Diagnosis not present

## 2020-12-05 DIAGNOSIS — L570 Actinic keratosis: Secondary | ICD-10-CM | POA: Diagnosis not present

## 2020-12-05 DIAGNOSIS — Z85828 Personal history of other malignant neoplasm of skin: Secondary | ICD-10-CM | POA: Diagnosis not present

## 2020-12-05 DIAGNOSIS — C44319 Basal cell carcinoma of skin of other parts of face: Secondary | ICD-10-CM | POA: Diagnosis not present

## 2020-12-13 ENCOUNTER — Telehealth: Payer: Self-pay | Admitting: Physician Assistant

## 2020-12-13 DIAGNOSIS — M7061 Trochanteric bursitis, right hip: Secondary | ICD-10-CM | POA: Diagnosis not present

## 2020-12-13 DIAGNOSIS — M7062 Trochanteric bursitis, left hip: Secondary | ICD-10-CM | POA: Diagnosis not present

## 2020-12-13 NOTE — Telephone Encounter (Signed)
Left voicemail message on home number and no answer/no voicemail on mobile number.

## 2020-12-13 NOTE — Telephone Encounter (Signed)
Patient called to reschedule due to conflicting appts and drive time she needed a 4 week fu but next available is in September .  Ok to do a virtual this time or push ov out ?  Please advise.

## 2020-12-13 NOTE — Telephone Encounter (Signed)
Pt c/o medication issue:  1. Name of Medication: Entresto and Lipitor    2. How are you currently taking this medication (dosage and times per day)? Different than rx   3. Are you having a reaction (difficulty breathing--STAT)? Made her Joretta Bachelor so she changed to once a day for entresto and is not taking Lipitor   4. What is your medication issue?  Calling to discuss if this is what provider wants her to do

## 2020-12-14 MED ORDER — LOSARTAN POTASSIUM 25 MG PO TABS: 25.0000 mg | ORAL_TABLET | Freq: Every day | ORAL | 1 refills | Status: DC

## 2020-12-14 NOTE — Telephone Encounter (Signed)
Spoke with patient and reviewed provider recommendations. Instructed her to stop Maxide, decrease her metoprolol succinate to 25 mg, stop entresto, and start losartan 25 mg once daily. We discussed medications in detail, confirmed upcoming appointment, instructed her to restart Atorvastatin, and that when she comes in for her appointment then she can discuss with Dr. Fletcher Anon about medications and her symptoms. She was agreeable with instructions, verbalized understanding, and had no further questions. Due to this medication confusion I offered to send her My Chart message with clear instructions on what she should be taking and she was appreciative for the time in reviewing this information. She then repeated all instructions to confirm understanding and also confirmed upcoming appointment here in our office.

## 2020-12-14 NOTE — Telephone Encounter (Signed)
Patient returning call.

## 2020-12-14 NOTE — Telephone Encounter (Signed)
Patient is also taking Maxide 37.5 mg and taking half a tablet (18.75) mg once daily.

## 2020-12-14 NOTE — Telephone Encounter (Signed)
Spoke with patient and she has not been feeling well. Whey she first started taking Entresto she was taking 1 tablet twice a day. She was not able to get up off the couch and just felt drained. She also could not bend over. She reports that her SBP was down to 100 as well. She then started taking Entresto 24-26 half tablet (12-13 mg) twice a day. She also stated that she stopped her atorvastatin thinking that was causing her low blood pressures and she restarted that at bedtime. Reviewed that medication was for cholesterol and should not affect blood pressure. She also reports taking metoprolol but had stopped that due to feeling bad as well. She did take dose last night and stated she did stop it before due to itching. She felt that she had too much in her system and blood pressure readings today were good at 124/63, 117/65, 116/62. Read back information to make sure her concerns were noted. Also requested she review medications she is taking at home. Cardiac medications that she is taing  were as follows:  Entresto 24-26 mg taking 1/2 tablet (12-13 mg) twice a day Metoprolol succinate 50 mg once daily Atorvastatin 80 mg once daily (at bedtime)  Advised that I would send this over to provider (Last saw Dr. Fletcher Anon 7/7) and would call her back with his recommendations. She verbalized understanding with no further questions at this time.

## 2020-12-14 NOTE — Telephone Encounter (Signed)
Why is she still taking Maxide?  I discontinued that during last visit.  Her blood pressure is probably running low because of multiple blood pressure medications.  Atorvastatin does not cause hypotension but she should continue. We can decrease Toprol to 25 mg once daily and switch Entresto to losartan 25 mg daily until her blood pressure improves and we can consider resuming Entresto in the near future.

## 2020-12-19 NOTE — Telephone Encounter (Signed)
Noted  

## 2020-12-22 ENCOUNTER — Ambulatory Visit: Payer: PPO | Admitting: Physician Assistant

## 2020-12-25 DIAGNOSIS — M48062 Spinal stenosis, lumbar region with neurogenic claudication: Secondary | ICD-10-CM | POA: Diagnosis not present

## 2020-12-25 DIAGNOSIS — M5416 Radiculopathy, lumbar region: Secondary | ICD-10-CM | POA: Diagnosis not present

## 2021-01-02 ENCOUNTER — Encounter: Payer: Self-pay | Admitting: Cardiovascular Disease

## 2021-01-02 ENCOUNTER — Other Ambulatory Visit: Payer: Self-pay

## 2021-01-02 ENCOUNTER — Other Ambulatory Visit: Payer: Self-pay | Admitting: *Deleted

## 2021-01-02 ENCOUNTER — Ambulatory Visit: Payer: PPO | Admitting: Cardiovascular Disease

## 2021-01-02 VITALS — BP 132/70 | HR 59 | Ht 63.0 in | Wt 158.2 lb

## 2021-01-02 DIAGNOSIS — I5022 Chronic systolic (congestive) heart failure: Secondary | ICD-10-CM | POA: Diagnosis not present

## 2021-01-02 DIAGNOSIS — Z87891 Personal history of nicotine dependence: Secondary | ICD-10-CM

## 2021-01-02 DIAGNOSIS — E785 Hyperlipidemia, unspecified: Secondary | ICD-10-CM

## 2021-01-02 DIAGNOSIS — I70208 Unspecified atherosclerosis of native arteries of extremities, other extremity: Secondary | ICD-10-CM

## 2021-01-02 DIAGNOSIS — Z720 Tobacco use: Secondary | ICD-10-CM

## 2021-01-02 DIAGNOSIS — I2510 Atherosclerotic heart disease of native coronary artery without angina pectoris: Secondary | ICD-10-CM

## 2021-01-02 DIAGNOSIS — F17210 Nicotine dependence, cigarettes, uncomplicated: Secondary | ICD-10-CM

## 2021-01-02 DIAGNOSIS — I10 Essential (primary) hypertension: Secondary | ICD-10-CM | POA: Diagnosis not present

## 2021-01-02 MED ORDER — EMPAGLIFLOZIN 10 MG PO TABS: 10.0000 mg | ORAL_TABLET | Freq: Every day | ORAL | 5 refills | Status: DC

## 2021-01-02 NOTE — Progress Notes (Signed)
Cardiology Office Note   Date:  01/02/2021   ID:  Susan, Mcmillan [birth year], MRN [id number]  PCP:  Susan Elk, MD  Cardiologist:   Susan Sacramento, MD   Chief Complaint  Patient presents with   Other    4 wk f/u no complaints today. Meds reviewed verbally with pt.       History of Present Illness: Susan Mcmillan is a 75 y.o. female who is here today for a follow-up visit regarding coronary artery diseas and chronic systolic heart failure due to ischemic cardiomyopathy. 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  She was found to have significant coronary artery atherosclerosis and calcifications on previous CT scan in 2017. Nuclear stress test showed moderate mid to distal anterior wall ischemia with normal ejection fraction.  Cardiac catheterization showed occluded mid LAD with right to left collaterals and left to left collaterals.  She otherwise had moderate RCA disease.   She has been treated medically since then.    Carotid Doppler in December 2020 showed no significant carotid disease.  She had worsening heart failure few months ago.   Repeat echocardiogram in May showed decreased ejection fraction to 35 to 40% with grade 1 diastolic dysfunction and mild mitral regurgitation.  Right and left cardiac catheterization was performed in June which showed no significant change since 2017 with chronically occluded mid LAD with collaterals, moderate proximal stenosis in first diagonal and moderate RCA disease.  Right heart catheterization showed normal right and left-sided filling pressures, minimal pulmonary hypertension and severely reduced cardiac output.  During last visit, I switched her from losartan to [facility] but she developed an itchy rash and  hypotension.  Thus, she was switched back to losartan and she is feeling better overall.  She had very low blood pressure readings and we decrease Toprol to 25 mg daily as well.  She is doing well with no chest pain or dyspnea.  She is feeling better overall.  Past Medical History:  Diagnosis Date   Anxiety    CAD (coronary artery disease)    a. 11/2015 MV: mod mid-distal ant ischemia, EF 58% w/ apical HK;  b. 11/2015 Cath: LM nl, LAD 80/128m(fills via collats from RPDA & D1), D1 60ost, LCX small, nl, RCA 50p/m, RPDA nl, RPAV min irregs, RPl1/2/3 min irregs, EF 50-55%.   Cancer of brain ([facility] 03/2017   Dental bridge present    bilateral top.  Permanent retainer - bottom.   Dysuria    History of kidney stones    Hyperlipemia    Hypertension    Hypertensive heart disease    IBS (irritable bowel syndrome)    Ischemic cardiomyopathy    a. 11/2015 V gram: EF 50-55%; b. 09/2020 Echo: EF 35-40%, ant, antsept, apical HK. Gr1 DD. Nl RV fxn. Mild MR.   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 Still smoking ~ 1 cigarette/day.   Urethral caruncle    Vertigo    Vulvar adhesions 10/06/2013   perianal skin  bridge noted at posterior fourchette and region of WLE surgical site   Vulvar leukoplakia    Vulvar pain     Past Surgical History:  Procedure Laterality Date   ABDOMINAL HERNIA REPAIR  2014   Dr. Rochel Brome   ABDOMINAL HYSTERECTOMY  1978   APPENDECTOMY     BRAIN SURGERY     CARDIAC CATHETERIZATION Left 12/07/2015   Procedure: Left Heart Cath and Coronary Angiography;  Surgeon: Wellington Hampshire, MD;  Location: [place] [facility];  Service: Cardiovascular;  Laterality: Left;   CATARACT EXTRACTION W/PHACO Right 05/28/2018   Procedure: CATARACT EXTRACTION PHACO AND  INTRAOCULAR LENS PLACEMENT ([place]) RIGHT;  Surgeon: Marchia Meiers, MD;  Location: [facility] ORS;  Service: Ophthalmology;  Laterality: Right;  [place] 00:56 CDE 7.17 Fluid pack Lot # [id number]   CATARACT EXTRACTION W/PHACO Left 01/06/2019   Procedure: CATARACT EXTRACTION PHACO AND INTRAOCULAR LENS PLACEMENT ([facility]) LEFT  00:50.3  19.3%  9.71;  Surgeon: Leandrew Koyanagi, MD;  Location: [place];  Service: Ophthalmology;  Laterality: Left;   CERVICAL BIOPSY     Dr. Enzo Bi   CHOLECYSTECTOMY     COLONOSCOPY WITH PROPOFOL N/A 02/09/2019   Procedure: COLONOSCOPY WITH PROPOFOL;  Surgeon: Lollie Sails, MD;  Location: [facility] ENDOSCOPY;  Service: Endoscopy;  Laterality: N/A;   ESOPHAGOGASTRODUODENOSCOPY (EGD) WITH PROPOFOL N/A 02/09/2019   Procedure: ESOPHAGOGASTRODUODENOSCOPY (EGD) WITH PROPOFOL;  Surgeon: Lollie Sails, MD;  Location: [facility] ENDOSCOPY;  Service: Endoscopy;  Laterality: N/A;   MOHS SURGERY Right    Leg   RIGHT/LEFT HEART CATH AND CORONARY ANGIOGRAPHY N/A 10/23/2020   Procedure: RIGHT/LEFT HEART CATH AND CORONARY ANGIOGRAPHY;  Surgeon: Wellington Hampshire, MD;  Location: [place] [facility];  Service: Cardiovascular;  Laterality: N/A;   SKIN BIOPSY     wide local excision  07/2013   vin 3 w/ margins involoved     Current Outpatient Medications  Medication Sig Dispense Refill   atorvastatin (LIPITOR) 80 MG tablet Take 1 tablet (80 mg total) by mouth in the morning.     Cholecalciferol (VITAMIN D3) 125 MCG (5000 UT) TABS Take 5,000 Units by mouth in the morning.     Cyanocobalamin (VITAMIN B-12) 5000 MCG SUBL Place 5,000 mcg under the tongue daily as needed (low energy (feeling poorly)).     diphenhydrAMINE (BENADRYL) 25 MG tablet Take 25 mg by mouth at bedtime.     FLUoxetine (PROZAC) 20 MG capsule Take 20 mg by mouth daily.     folic acid (FOLVITE) Q000111Q MCG tablet Take 400 mcg by mouth in the morning.     hyoscyamine (LEVSIN SL) 0.125 MG SL tablet Place 0.125 mg under the  tongue every 6 (six) hours as needed. Place 1 tablet under the tongue every 6 hours as needed for cramping for up to 10 days     losartan (COZAAR) 25 MG tablet Take 1 tablet (25 mg total) by mouth daily. 30 tablet 1   metoprolol succinate (TOPROL-XL) 50 MG 24 hr tablet Take 50 mg by mouth in the morning.     NONFORMULARY OR COMPOUNDED ITEM Apply 1 application topically 2 (two) times daily as needed (cancer spots). FLUOROURACIL 5% + CALCIPOTRIENE 0.005%     pantoprazole (PROTONIX) 40 MG tablet Take 40 mg by mouth 2 (two) times daily.     tetrahydrozoline 0.05 % ophthalmic solution Place 1 drop into both eyes 3 (three) times daily as needed (dry eyes).     No current facility-administered medications for this visit.  Allergies:   Tape, Codeine, Demerol [meperidine], Erythromycin, Iodinated diagnostic agents, Other, Penicillins, and Entresto [sacubitril-valsartan]    Social History:  The patient  reports that she has been smoking cigarettes. She has a 13.75 pack-year smoking history. She has never used smokeless tobacco. She reports current alcohol use. She reports that she does not use drugs.   Family History:  The patient's family history includes Bone cancer in her father; Cancer in her father; Congestive Heart Failure in her mother; Dementia in her sister; Hyperlipidemia in her sister; Hypertension in her sister; Macular degeneration in her sister.    ROS:  Please see the history of present illness.   Otherwise, review of systems are positive for none.   All other systems are reviewed and negative.    PHYSICAL EXAM: VS:  BP 132/70 (BP Location: Left Arm, Patient Position: Sitting, Cuff Size: Normal)   Pulse (!) 59   Ht '5\' 3"'$  (1.6 m)   Wt 158 lb 4 oz (71.8 kg)   SpO2 98%   BMI 28.03 kg/m  , BMI Body mass index is 28.03 kg/m. GEN: Well nourished, well developed, in no acute distress  HEENT: normal  Neck: no JVD, carotid bruits, or masses Cardiac: RRR; no murmurs, rubs, or gallops,no  edema  Respiratory:  clear to auscultation bilaterally, normal work of breathing GI: soft, nontender, nondistended, + BS MS: no deformity or atrophy  Skin: warm and dry, no rash Neuro:  Strength and sensation are intact Psych: euthymic mood, full affect    EKG:  EKG is ordered today. Sinus bradycardia with sinus arrhythmia.  Anterolateral ST changes suggestive of ischemia..  Recent Labs: 11/28/2020: ALT 19; BUN 16; Creatinine, Ser 0.97; Hemoglobin 14.3; Platelets 271; Potassium 4.2; Sodium 136    Lipid Panel    Component Value Date/Time   CHOL 149 02/06/2016 0953   TRIG 155 (H) 02/06/2016 0953   HDL 54 02/06/2016 0953   CHOLHDL 2.8 02/06/2016 0953   CHOLHDL 4 05/17/2015 1442   VLDL 57.6 (H) 05/17/2015 1442   LDLCALC 64 02/06/2016 0953   LDLDIRECT 88.0 05/17/2015 1442      Wt Readings from Last 3 Encounters:  01/02/21 158 lb 4 oz (71.8 kg)  12/04/20 160 lb 8 oz (72.8 kg)  11/16/20 159 lb (72.1 kg)       ASSESSMENT AND PLAN:  1.  Coronary artery disease involving native coronary arteries without angina:  She has chronically occluded LAD with collaterals and moderate diagonal and RCA disease.  Recommend aggressive medical therapy.  2.  Chronic systolic heart failure: Ejection fraction was 35 to 40% likely due to ischemic cardiomyopathy.  She is euvolemic clinically and by right heart catheterization.  She had a rash with Entresto and hypotension and thus she was switched back to losartan.  I elected to add Jardiance 10 mg daily today.  3.  Essential hypertension: Blood pressure is controlled.  4.  Hyperlipidemia: Continue high-dose atorvastatin. I reviewed most recent lipid profile in August which showed an LDL of 66.  5.  Tobacco use: She cut down smoking to 2 cigarettes a day.  6.  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  She does have a dominant ulnar artery that was widely patent.  This does not require further management but  precludes future catheterization via the right radial artery.    Disposition:   FU with me in 4 months.  Signed,  Susan Sacramento, MD  01/02/2021 3:49 PM  [facility]

## 2021-01-02 NOTE — Patient Instructions (Addendum)
Medication Instructions:  Your physician has recommended you make the following change in your medication:   START Jardiance 10 mg daily. An Rx has been sent to your pharmacy.  *If you need a refill on your cardiac medications before your next appointment, please call your pharmacy*   Lab Work: None ordered If you have labs (blood work) drawn today and your tests are completely normal, you will receive your results only by: [facility] (if you have MyChart) OR A paper copy in the mail If you have any lab test that is abnormal or we need to change your treatment, we will call you to review the results.   Testing/Procedures: None ordered   Follow-Up: At [facility], you and your health needs are our priority.  As part of our continuing mission to provide you with exceptional heart care, we have created designated Provider Care Teams.  These Care Teams include your primary Cardiologist (physician) and Advanced Practice Providers (APPs -  Physician Assistants and Nurse Practitioners) who all work together to provide you with the care you need, when you need it.  We recommend signing up for the patient portal called "MyChart".  Sign up information is provided on this After Visit Summary.  MyChart is used to connect with patients for Virtual Visits (Telemedicine).  Patients are able to view lab/test results, encounter notes, upcoming appointments, etc.  Non-urgent messages can be sent to your provider as well.   To learn more about what you can do with MyChart, go to [email or web address].    Your next appointment:   4 month(s)  The format for your next appointment:   In Person  Provider:   You may see Kathlyn Sacramento, MD or one of the following Advanced Practice Providers on your designated Care Team:   Murray Hodgkins, NP Christell Faith, PA-C Marrianne Mood, PA-C Cadence Kathlen Mody, [place]   Other Instructions N/A

## 2021-01-09 ENCOUNTER — Ambulatory Visit
Admission: RE | Admit: 2021-01-09 | Discharge: 2021-01-09 | Disposition: A | Discharge status: Home / Self Care | Payer: PPO | Source: Ambulatory Visit | Attending: Acute Care | Admitting: Acute Care

## 2021-01-09 ENCOUNTER — Other Ambulatory Visit: Payer: Self-pay

## 2021-01-09 DIAGNOSIS — Z87891 Personal history of nicotine dependence: Secondary | ICD-10-CM | POA: Diagnosis not present

## 2021-01-09 DIAGNOSIS — F17210 Nicotine dependence, cigarettes, uncomplicated: Secondary | ICD-10-CM | POA: Insufficient documentation

## 2021-01-10 ENCOUNTER — Other Ambulatory Visit: Payer: Self-pay | Admitting: *Deleted

## 2021-01-10 MED ORDER — LOSARTAN POTASSIUM 25 MG PO TABS
25.0000 mg | ORAL_TABLET | Freq: Every day | ORAL | 0 refills | Status: DC
Start: 2021-01-10 — End: 2021-05-03

## 2021-01-19 ENCOUNTER — Encounter: Payer: Self-pay | Admitting: *Deleted

## 2021-01-19 DIAGNOSIS — F17210 Nicotine dependence, cigarettes, uncomplicated: Secondary | ICD-10-CM

## 2021-01-19 DIAGNOSIS — Z87891 Personal history of nicotine dependence: Secondary | ICD-10-CM

## 2021-02-13 DIAGNOSIS — L821 Other seborrheic keratosis: Secondary | ICD-10-CM | POA: Diagnosis not present

## 2021-02-13 DIAGNOSIS — Z85828 Personal history of other malignant neoplasm of skin: Secondary | ICD-10-CM | POA: Diagnosis not present

## 2021-02-13 DIAGNOSIS — L570 Actinic keratosis: Secondary | ICD-10-CM | POA: Diagnosis not present

## 2021-02-13 DIAGNOSIS — L858 Other specified epidermal thickening: Secondary | ICD-10-CM | POA: Diagnosis not present

## 2021-02-13 DIAGNOSIS — D492 Neoplasm of unspecified behavior of bone, soft tissue, and skin: Secondary | ICD-10-CM | POA: Diagnosis not present

## 2021-02-13 DIAGNOSIS — L814 Other melanin hyperpigmentation: Secondary | ICD-10-CM | POA: Diagnosis not present

## 2021-02-13 DIAGNOSIS — D229 Melanocytic nevi, unspecified: Secondary | ICD-10-CM | POA: Diagnosis not present

## 2021-03-16 ENCOUNTER — Other Ambulatory Visit: Payer: Self-pay | Admitting: Family

## 2021-03-16 DIAGNOSIS — E785 Hyperlipidemia, unspecified: Secondary | ICD-10-CM

## 2021-03-16 DIAGNOSIS — M7062 Trochanteric bursitis, left hip: Secondary | ICD-10-CM | POA: Diagnosis not present

## 2021-03-16 DIAGNOSIS — M7061 Trochanteric bursitis, right hip: Secondary | ICD-10-CM | POA: Diagnosis not present

## 2021-03-27 DIAGNOSIS — E782 Mixed hyperlipidemia: Secondary | ICD-10-CM | POA: Diagnosis not present

## 2021-03-27 DIAGNOSIS — R7303 Prediabetes: Secondary | ICD-10-CM | POA: Diagnosis not present

## 2021-03-27 DIAGNOSIS — I10 Essential (primary) hypertension: Secondary | ICD-10-CM | POA: Diagnosis not present

## 2021-03-27 DIAGNOSIS — Z0000 Encounter for general adult medical examination without abnormal findings: Secondary | ICD-10-CM | POA: Diagnosis not present

## 2021-04-03 DIAGNOSIS — R7303 Prediabetes: Secondary | ICD-10-CM | POA: Diagnosis not present

## 2021-04-03 DIAGNOSIS — I2510 Atherosclerotic heart disease of native coronary artery without angina pectoris: Secondary | ICD-10-CM | POA: Diagnosis not present

## 2021-04-03 DIAGNOSIS — M722 Plantar fascial fibromatosis: Secondary | ICD-10-CM | POA: Diagnosis not present

## 2021-04-03 DIAGNOSIS — Z0000 Encounter for general adult medical examination without abnormal findings: Secondary | ICD-10-CM | POA: Diagnosis not present

## 2021-04-03 DIAGNOSIS — I2583 Coronary atherosclerosis due to lipid rich plaque: Secondary | ICD-10-CM | POA: Diagnosis not present

## 2021-04-03 DIAGNOSIS — I10 Essential (primary) hypertension: Secondary | ICD-10-CM | POA: Diagnosis not present

## 2021-04-03 DIAGNOSIS — D0471 Carcinoma in situ of skin of right lower limb, including hip: Secondary | ICD-10-CM | POA: Diagnosis not present

## 2021-04-03 DIAGNOSIS — E782 Mixed hyperlipidemia: Secondary | ICD-10-CM | POA: Diagnosis not present

## 2021-04-03 DIAGNOSIS — C8510 Unspecified B-cell lymphoma, unspecified site: Secondary | ICD-10-CM | POA: Diagnosis not present

## 2021-04-09 DIAGNOSIS — M5416 Radiculopathy, lumbar region: Secondary | ICD-10-CM | POA: Diagnosis not present

## 2021-04-09 DIAGNOSIS — M48062 Spinal stenosis, lumbar region with neurogenic claudication: Secondary | ICD-10-CM | POA: Diagnosis not present

## 2021-04-10 ENCOUNTER — Other Ambulatory Visit: Payer: Self-pay

## 2021-04-10 ENCOUNTER — Ambulatory Visit (INDEPENDENT_AMBULATORY_CARE_PROVIDER_SITE_OTHER): Payer: PPO

## 2021-04-10 ENCOUNTER — Ambulatory Visit: Payer: PPO | Admitting: Podiatry

## 2021-04-10 DIAGNOSIS — M722 Plantar fascial fibromatosis: Secondary | ICD-10-CM

## 2021-04-10 MED ORDER — BETAMETHASONE SOD PHOS & ACET 6 (3-3) MG/ML IJ SUSP
3.0000 mg | Freq: Once | INTRAMUSCULAR | Status: AC
Start: 2021-04-10 — End: 2021-04-10
  Administered 2021-04-10: 3 mg via INTRA_ARTICULAR

## 2021-04-10 NOTE — Progress Notes (Signed)
   Subjective: 75 y.o. female presenting as a new patient with her husband for evaluation of left heel pain this been going on for a few months now.  It is very tender.  She has been using heel cups with minimal improvement.  She presents for further treatment and evaluation   Past Medical History:  Diagnosis Date   Anxiety    CAD (coronary artery disease)    a. 11/2015 MV: mod mid-distal ant ischemia, EF 58% w/ apical HK;  b. 11/2015 Cath: LM nl, LAD 80/154m (fills via collats from RPDA & D1), D1 60ost, LCX small, nl, RCA 50p/m, RPDA nl, RPAV min irregs, RPl1/2/3 min irregs, EF 50-55%.   Cancer of brain [facility]) 03/2017   Dental bridge present    bilateral top.  Permanent retainer - bottom.   Dysuria    History of kidney stones    Hyperlipemia    Hypertension    Hypertensive heart disease    IBS (irritable bowel syndrome)    Ischemic cardiomyopathy    a. 11/2015 V gram: EF 50-55%; b. 09/2020 Echo: EF 35-40%, ant, antsept, apical HK. Gr1 DD. Nl RV fxn. Mild MR.   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 Still smoking ~ 1 cigarette/day.   Urethral caruncle    Vertigo    Vulvar adhesions 10/06/2013   perianal skin bridge noted at posterior fourchette and region of WLE surgical site   Vulvar leukoplakia    Vulvar pain      Objective: Physical Exam General: The patient is alert and oriented x3 in no acute distress.  Dermatology: Skin is warm, dry and supple bilateral lower extremities. Negative for open lesions or macerations bilateral.   Vascular: Dorsalis Pedis and Posterior Tibial pulses palpable bilateral.  Capillary fill time is immediate to all digits.  Neurological: Epicritic and protective threshold intact bilateral.   Musculoskeletal: Tenderness to  palpation to the plantar aspect of the left heel along the plantar fascia. All other joints range of motion within normal limits bilateral. Strength 5/5 in all groups bilateral.   Radiographic exam: Normal osseous mineralization. Joint spaces preserved. No fracture/dislocation/boney destruction. No other soft tissue abnormalities or radiopaque foreign bodies.   Assessment: 1. Plantar fasciitis left foot  Plan of Care:  1. Patient evaluated. Xrays reviewed.   2. Injection of 0.5cc Celestone soluspan injected into the left plantar fascia.  3.  Patient has history of acid reflux.  No NSAIDs prescribed today 4.  Patient has routine epidural lower back injections.  No cam boot dispensed since this may aggravate her lumbar spine 5.  Return to clinic in 4 weeks   Edrick Kins, DPM [facility]  Dr. Edrick Kins, DPM    [address], [place] [address]                Office [phone]  Fax [phone]

## 2021-05-03 ENCOUNTER — Encounter: Payer: Self-pay | Admitting: Cardiovascular Disease

## 2021-05-03 ENCOUNTER — Ambulatory Visit: Payer: PPO | Admitting: Cardiovascular Disease

## 2021-05-03 ENCOUNTER — Other Ambulatory Visit: Payer: Self-pay

## 2021-05-03 VITALS — BP 116/74 | HR 72 | Ht 63.0 in | Wt 155.4 lb

## 2021-05-03 DIAGNOSIS — I70208 Unspecified atherosclerosis of native arteries of extremities, other extremity: Secondary | ICD-10-CM | POA: Diagnosis not present

## 2021-05-03 DIAGNOSIS — I2510 Atherosclerotic heart disease of native coronary artery without angina pectoris: Secondary | ICD-10-CM | POA: Diagnosis not present

## 2021-05-03 DIAGNOSIS — Z720 Tobacco use: Secondary | ICD-10-CM | POA: Diagnosis not present

## 2021-05-03 DIAGNOSIS — I10 Essential (primary) hypertension: Secondary | ICD-10-CM | POA: Diagnosis not present

## 2021-05-03 DIAGNOSIS — E785 Hyperlipidemia, unspecified: Secondary | ICD-10-CM

## 2021-05-03 DIAGNOSIS — I5022 Chronic systolic (congestive) heart failure: Secondary | ICD-10-CM

## 2021-05-03 MED ORDER — EMPAGLIFLOZIN 10 MG PO TABS: 10.0000 mg | ORAL_TABLET | Freq: Every day | ORAL | 3 refills | Status: DC

## 2021-05-03 MED ORDER — LOSARTAN POTASSIUM 25 MG PO TABS: 25.0000 mg | ORAL_TABLET | Freq: Every day | ORAL | 3 refills | Status: DC

## 2021-05-03 NOTE — Patient Instructions (Signed)

## 2021-05-03 NOTE — Progress Notes (Signed)
Cardiology Office Note   Date:  05/03/2021   ID:  Ahtziri, Jeffries [birth year], MRN [id number]  PCP:  Marinda Elk, MD  Cardiologist:   Kathlyn Sacramento, MD   Chief Complaint  Patient presents with   Other    4 month fu c/o palpitations, dizziness/off balance. Meds reviewed verbally with pt.       History of Present Illness: Susan Mcmillan is a 75 y.o. female who is here today for a follow-up visit regarding coronary artery disease and chronic systolic heart failure due to ischemic cardiomyopathy. 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  She was found to have significant coronary artery atherosclerosis and calcifications on previous CT scan in 2017. Nuclear stress test showed moderate mid to distal anterior wall ischemia with normal ejection fraction.  Cardiac catheterization showed occluded mid LAD with right to left collaterals and left to left collaterals.  She otherwise had moderate RCA disease.   She has been treated medically since then.    Carotid Doppler in December 2020 showed no significant carotid disease.  She had worsening heart failure early this year.   Repeat echocardiogram in May showed decreased ejection fraction to 35 to 40% with grade 1 diastolic dysfunction and mild mitral regurgitation.  Right and left cardiac catheterization was performed in June which showed no significant change since 2017 with chronically occluded mid LAD with collaterals, moderate proximal stenosis in first diagonal and moderate RCA disease.  Right heart catheterization showed normal right and left-sided filling pressures, minimal pulmonary hypertension and severely reduced cardiac output.  She developed a rash and hypotension with Entresto.  Advancing heart failure  medications have been limited by low blood pressure.  During last visit, I added Jardiance and she reports intolerance so far.  No chest pain or worsening dyspnea.  No significant leg edema.  Her biggest issue continues to be dizziness and poor balance with spinning sensation.   Past Medical History:  Diagnosis Date   Anxiety    CAD (coronary artery disease)    a. 11/2015 MV: mod mid-distal ant ischemia, EF 58% w/ apical HK;  b. 11/2015 Cath: LM nl, LAD 80/159m (fills via collats from RPDA & D1), D1 60ost, LCX small, nl, RCA 50p/m, RPDA nl, RPAV min irregs, RPl1/2/3 min irregs, EF 50-55%.   Cancer of brain [facility]) 03/2017   Dental bridge present    bilateral top.  Permanent retainer - bottom.   Dysuria    History of kidney stones    Hyperlipemia    Hypertension    Hypertensive heart disease    IBS (irritable bowel syndrome)    Ischemic cardiomyopathy    a. 11/2015 V gram: EF 50-55%; b. 09/2020 Echo: EF 35-40%, ant, antsept, apical HK. Gr1 DD. Nl RV fxn. Mild MR.   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 Still smoking ~ 1 cigarette/day.   Urethral caruncle    Vertigo    Vulvar adhesions 10/06/2013   perianal skin bridge noted at posterior fourchette and  region of WLE surgical site   Vulvar leukoplakia    Vulvar pain     Past Surgical History:  Procedure Laterality Date   ABDOMINAL HERNIA REPAIR  2014   Dr. Rochel Brome   ABDOMINAL HYSTERECTOMY  1978   APPENDECTOMY     BRAIN SURGERY     CARDIAC CATHETERIZATION Left 12/07/2015   Procedure: Left Heart Cath and Coronary Angiography;  Surgeon: Wellington Hampshire, MD;  Location: [place] [facility];  Service: Cardiovascular;  Laterality: Left;   CATARACT EXTRACTION W/PHACO Right 05/28/2018   Procedure: CATARACT  EXTRACTION PHACO AND INTRAOCULAR LENS PLACEMENT ([place]) RIGHT;  Surgeon: Marchia Meiers, MD;  Location: [facility] ORS;  Service: Ophthalmology;  Laterality: Right;  [place] 00:56 CDE 7.17 Fluid pack Lot # [id number]   CATARACT EXTRACTION W/PHACO Left 01/06/2019   Procedure: CATARACT EXTRACTION PHACO AND INTRAOCULAR LENS PLACEMENT ([facility]) LEFT  00:50.3  19.3%  9.71;  Surgeon: Leandrew Koyanagi, MD;  Location: [place];  Service: Ophthalmology;  Laterality: Left;   CERVICAL BIOPSY     Dr. Enzo Bi   CHOLECYSTECTOMY     COLONOSCOPY WITH PROPOFOL N/A 02/09/2019   Procedure: COLONOSCOPY WITH PROPOFOL;  Surgeon: Lollie Sails, MD;  Location: [facility] ENDOSCOPY;  Service: Endoscopy;  Laterality: N/A;   ESOPHAGOGASTRODUODENOSCOPY (EGD) WITH PROPOFOL N/A 02/09/2019   Procedure: ESOPHAGOGASTRODUODENOSCOPY (EGD) WITH PROPOFOL;  Surgeon: Lollie Sails, MD;  Location: [facility] ENDOSCOPY;  Service: Endoscopy;  Laterality: N/A;   MOHS SURGERY Right    Leg   RIGHT/LEFT HEART CATH AND CORONARY ANGIOGRAPHY N/A 10/23/2020   Procedure: RIGHT/LEFT HEART CATH AND CORONARY ANGIOGRAPHY;  Surgeon: Wellington Hampshire, MD;  Location: [place] [facility];  Service: Cardiovascular;  Laterality: N/A;   SKIN BIOPSY     wide local excision  07/2013   vin 3 w/ margins involoved     Current Outpatient Medications  Medication Sig Dispense Refill   atorvastatin (LIPITOR) 80 MG tablet TAKE 1 TABLET(80 MG) BY MOUTH DAILY 90 tablet 0   Cyanocobalamin (VITAMIN B-12) 5000 MCG SUBL Place 5,000 mcg under the tongue daily as needed (low energy (feeling poorly)).     diphenhydrAMINE (BENADRYL) 25 MG tablet Take 25 mg by mouth at bedtime.     empagliflozin (JARDIANCE) 10 MG TABS tablet Take 1 tablet (10 mg total) by mouth daily before breakfast. 30 tablet 5   FLUoxetine (PROZAC) 20 MG capsule Take 20 mg by mouth daily.     hyoscyamine (LEVSIN SL) 0.125 MG SL tablet Place 0.125 mg under the tongue every 6 (six) hours as needed. Place 1  tablet under the tongue every 6 hours as needed for cramping for up to 10 days     losartan (COZAAR) 25 MG tablet Take 1 tablet (25 mg total) by mouth daily. 90 tablet 0   metoprolol succinate (TOPROL-XL) 50 MG 24 hr tablet Take 50 mg by mouth in the morning.     NONFORMULARY OR COMPOUNDED ITEM Apply 1 application topically 2 (two) times daily as needed (cancer spots). FLUOROURACIL 5% + CALCIPOTRIENE 0.005%     pantoprazole (PROTONIX) 40 MG tablet Take 40 mg by mouth 2 (two) times daily.     No current facility-administered medications for this visit.    Allergies:   Tape, Codeine, Demerol [meperidine], Erythromycin, Iodinated diagnostic agents, Other, Penicillins, and Entresto [sacubitril-valsartan]    Social History:  The patient  reports that she has been smoking cigarettes. She has a 13.75 pack-year smoking history. She has never used smokeless tobacco.  She reports current alcohol use. She reports that she does not use drugs.   Family History:  The patient's family history includes Bone cancer in her father; Cancer in her father; Congestive Heart Failure in her mother; Dementia in her sister; Hyperlipidemia in her sister; Hypertension in her sister; Macular degeneration in her sister.    ROS:  Please see the history of present illness.   Otherwise, review of systems are positive for none.   All other systems are reviewed and negative.    PHYSICAL EXAM: VS:  BP 116/74 (BP Location: Left Arm, Patient Position: Sitting, Cuff Size: Normal)    Pulse 72    Ht 5\' 3"  (1.6 m)    Wt 155 lb 6 oz (70.5 kg)    SpO2 98%    BMI 27.52 kg/m  , BMI Body mass index is 27.52 kg/m. GEN: Well nourished, well developed, in no acute distress  HEENT: normal  Neck: no JVD, carotid bruits, or masses Cardiac: RRR; no murmurs, rubs, or gallops,no edema  Respiratory:  clear to auscultation bilaterally, normal work of breathing GI: soft, nontender, nondistended, + BS MS: no deformity or atrophy  Skin: warm and  dry, no rash Neuro:  Strength and sensation are intact Psych: euthymic mood, full affect    EKG:  EKG is ordered today. Sinus bradycardia with normal sinus rhythm with sinus arrhythmia.  Nonspecific T wave changes.  Recent Labs: 11/28/2020: ALT 19; BUN 16; Creatinine, Ser 0.97; Hemoglobin 14.3; Platelets 271; Potassium 4.2; Sodium 136    Lipid Panel    Component Value Date/Time   CHOL 149 02/06/2016 0953   TRIG 155 (H) 02/06/2016 0953   HDL 54 02/06/2016 0953   CHOLHDL 2.8 02/06/2016 0953   CHOLHDL 4 05/17/2015 1442   VLDL 57.6 (H) 05/17/2015 1442   LDLCALC 64 02/06/2016 0953   LDLDIRECT 88.0 05/17/2015 1442      Wt Readings from Last 3 Encounters:  05/03/21 155 lb 6 oz (70.5 kg)  01/09/21 160 lb (72.6 kg)  01/02/21 158 lb 4 oz (71.8 kg)       ASSESSMENT AND PLAN:  1.  Coronary artery disease involving native coronary arteries without angina:  She has chronically occluded LAD with collaterals and moderate diagonal and RCA disease.  Recommend aggressive medical therapy.  2.  Chronic systolic heart failure: Ejection fraction was 35 to 40% likely due to ischemic cardiomyopathy.  She appears to be euvolemic.  Continue treatment with Toprol, losartan and Jardiance.  3.  Essential hypertension: Blood pressure is controlled.  4.  Hyperlipidemia: I reviewed her labs done in November which showed an LDL of 46 which is at target.  Continue atorvastatin.  5.  Tobacco use: She cut down smoking to 2 cigarettes a day.  6.  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  She does have a dominant ulnar artery that was widely patent.  This does not require further management but precludes future catheterization via the right radial artery.  7.  Dizziness, suspect that this is likely vertigo.  She is not orthostatic by blood pressure today.  Consider neurology or ENT evaluation.   Disposition:   FU with me in 6 months.  Signed,  Kathlyn Sacramento, MD   05/03/2021 12:26 PM    [place]

## 2021-05-15 ENCOUNTER — Encounter: Payer: Self-pay | Admitting: Podiatry

## 2021-05-15 ENCOUNTER — Other Ambulatory Visit: Payer: Self-pay

## 2021-05-15 ENCOUNTER — Ambulatory Visit: Payer: PPO | Admitting: Podiatry

## 2021-05-15 DIAGNOSIS — M722 Plantar fascial fibromatosis: Secondary | ICD-10-CM

## 2021-05-22 DIAGNOSIS — R81 Glycosuria: Secondary | ICD-10-CM | POA: Diagnosis not present

## 2021-05-22 DIAGNOSIS — Z8679 Personal history of other diseases of the circulatory system: Secondary | ICD-10-CM | POA: Diagnosis not present

## 2021-05-22 DIAGNOSIS — F17200 Nicotine dependence, unspecified, uncomplicated: Secondary | ICD-10-CM | POA: Diagnosis not present

## 2021-05-22 DIAGNOSIS — I2510 Atherosclerotic heart disease of native coronary artery without angina pectoris: Secondary | ICD-10-CM | POA: Diagnosis not present

## 2021-05-22 DIAGNOSIS — I509 Heart failure, unspecified: Secondary | ICD-10-CM | POA: Diagnosis not present

## 2021-05-22 DIAGNOSIS — I739 Peripheral vascular disease, unspecified: Secondary | ICD-10-CM | POA: Diagnosis not present

## 2021-05-23 ENCOUNTER — Telehealth: Payer: Self-pay | Admitting: Cardiovascular Disease

## 2021-05-23 NOTE — Telephone Encounter (Signed)
Returned to the call to Olivia Mackie, NP with [employer].  Olivia Mackie was out to the patient home yesterday. Patient sts that the patient reported having dizziness and frequent UTIs.   Olivia Mackie did urine dip stick and noted that it was negative for a UTI, but the patient had a lot of glucose in her urine.  The patient thinks that this is related to [place], and that Jardaince may be causing low blood sugars which may causing the patients dizziness.  Carlena Bjornstad that I will fwd the update to Dr. Fletcher Anon to advise.

## 2021-05-23 NOTE — Telephone Encounter (Signed)
Nurse Practioner with [facility] is calling states patient has sugar in her urine thinks it is from the [place]. Please call to discuss.

## 2021-05-24 NOTE — Telephone Encounter (Signed)
Jardiance can cause UTIs and can cause orthostatic hypotension leading to dizziness.  Usually, it does not lead to low blood sugars.  Susan Mcmillan is an excellent medication for heart failure but if she is having these symptoms, she can stop the medication and monitor.

## 2021-05-24 NOTE — Telephone Encounter (Signed)
Rennis Golden, NP made aware of Dr. Tyrell Antonio response and recommendation. Carlena Bjornstad that I will f/u with the patient directly.  Called the patient made aware of Dr. Tyrell Antonio response. Patient sts that she has not had reoccurring UTIs but she does have episodes of dizziness.  She has held her toprol for 2 days and feels better. She does not check her BP regularly. She does take all of her medication at the same time daily after breakfast. Adv the patient that taking all her medications at the same time, could be causing her BP to dip a little low, which could be causing her dizziness. Recommended that she space out her medications and try taking her metoprolol at lunch time. She would also prefer to continue taking Jardiance for now.  Adv the patient to monitor her dizziness and to let us know if it does not improve. Patient agreeable with the plan and voiced appreciation for the assistance.

## 2021-05-26 DIAGNOSIS — M722 Plantar fascial fibromatosis: Secondary | ICD-10-CM | POA: Diagnosis not present

## 2021-05-26 MED ORDER — BETAMETHASONE SOD PHOS & ACET 6 (3-3) MG/ML IJ SUSP
3.0000 mg | Freq: Once | INTRAMUSCULAR | Status: AC
  Administered 2021-05-26: 3 mg via INTRA_ARTICULAR

## 2021-05-26 NOTE — Progress Notes (Signed)
Subjective: 76 y.o. female presenting today for follow-up evaluation of plantar fasciitis to the left foot.  Patient states that she continues have some pain and tenderness.  She says the injection helped significantly but now the pain is moved to the lateral aspect of the heel.  She says that it feels like a stone bruise.  She presents for further treatment and evaluation   Past Medical History:  Diagnosis Date   Anxiety    CAD (coronary artery disease)    a. 11/2015 MV: mod mid-distal ant ischemia, EF 58% w/ apical HK;  b. 11/2015 Cath: LM nl, LAD 80/146m (fills via collats from RPDA & D1), D1 60ost, LCX small, nl, RCA 50p/m, RPDA nl, RPAV min irregs, RPl1/2/3 min irregs, EF 50-55%.   Cancer of brain [facility]) 03/2017   Dental bridge present    bilateral top.  Permanent retainer - bottom.   Dysuria    History of kidney stones    Hyperlipemia    Hypertension    Hypertensive heart disease    IBS (irritable bowel syndrome)    Ischemic cardiomyopathy    a. 11/2015 V gram: EF 50-55%; b. 09/2020 Echo: EF 35-40%, ant, antsept, apical HK. Gr1 DD. Nl RV fxn. Mild MR.   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 Still smoking ~ 1 cigarette/day.   Urethral caruncle    Vertigo    Vulvar adhesions 10/06/2013   perianal skin bridge noted at posterior fourchette and region of WLE surgical site   Vulvar leukoplakia    Vulvar pain    Past Surgical History:  Procedure Laterality Date   ABDOMINAL HERNIA REPAIR  2014   Dr. Rochel Brome   ABDOMINAL HYSTERECTOMY  1978   APPENDECTOMY     BRAIN SURGERY     CARDIAC CATHETERIZATION Left 12/07/2015   Procedure: Left Heart Cath and Coronary Angiography;  Surgeon: Wellington Hampshire, MD;  Location: [place] [facility];  Service:  Cardiovascular;  Laterality: Left;   CATARACT EXTRACTION W/PHACO Right 05/28/2018   Procedure: CATARACT EXTRACTION PHACO AND INTRAOCULAR LENS PLACEMENT ([place]) RIGHT;  Surgeon: Marchia Meiers, MD;  Location: [facility] ORS;  Service: Ophthalmology;  Laterality: Right;  [place] 00:56 CDE 7.17 Fluid pack Lot # [id number]   CATARACT EXTRACTION W/PHACO Left 01/06/2019   Procedure: CATARACT EXTRACTION PHACO AND INTRAOCULAR LENS PLACEMENT ([facility]) LEFT  00:50.3  19.3%  9.71;  Surgeon: Leandrew Koyanagi, MD;  Location: [place];  Service: Ophthalmology;  Laterality: Left;   CERVICAL BIOPSY     Dr. Enzo Bi   CHOLECYSTECTOMY     COLONOSCOPY WITH PROPOFOL N/A 02/09/2019   Procedure: COLONOSCOPY WITH PROPOFOL;  Surgeon: Lollie Sails, MD;  Location: [facility] ENDOSCOPY;  Service: Endoscopy;  Laterality: N/A;   ESOPHAGOGASTRODUODENOSCOPY (EGD) WITH PROPOFOL N/A 02/09/2019   Procedure: ESOPHAGOGASTRODUODENOSCOPY (EGD) WITH PROPOFOL;  Surgeon: Lollie Sails, MD;  Location: [facility] ENDOSCOPY;  Service: Endoscopy;  Laterality: N/A;   MOHS SURGERY Right    Leg   RIGHT/LEFT HEART CATH AND CORONARY ANGIOGRAPHY N/A 10/23/2020   Procedure: RIGHT/LEFT HEART CATH AND CORONARY ANGIOGRAPHY;  Surgeon: Wellington Hampshire, MD;  Location: [place] [facility];  Service: Cardiovascular;  Laterality: N/A;   SKIN BIOPSY  wide local excision  07/2013   vin 3 w/ margins involoved   Allergies  Allergen Reactions   Tape Rash    bruises   Codeine Nausea And Vomiting   Demerol [Meperidine] Other (See Comments)    Heavy sedation   Erythromycin Nausea Only    "All Mycins cause nausea"   Iodinated Contrast Media Nausea And Vomiting   Other Other (See Comments)   Penicillins Nausea Only    Can do amoxicillin Has patient had a PCN reaction causing immediate rash, facial/tongue/throat swelling, SOB or lightheadedness with hypotension: Yes Has patient had a PCN reaction causing severe rash involving mucus membranes or skin  necrosis: No Has patient had a PCN reaction that required hospitalization No Has patient had a PCN reaction occurring within the last 10 years: No If all of the above answers are "NO", then may proceed with Cephalosporin use.    Entresto [Sacubitril-Valsartan] Rash     Objective: Physical Exam General: The patient is alert and oriented x3 in no acute distress.  Dermatology: Skin is warm, dry and supple bilateral lower extremities. Negative for open lesions or macerations bilateral.   Vascular: Dorsalis Pedis and Posterior Tibial pulses palpable bilateral.  Capillary fill time is immediate to all digits.  Neurological: Epicritic and protective threshold intact bilateral.   Musculoskeletal: Tenderness to palpation to the lateral plantar aspect of the left heel along the plantar fascia. All other joints range of motion within normal limits bilateral. Strength 5/5 in all groups bilateral.   Assessment: 1. Plantar fasciitis left foot; lateral aspect  Plan of Care:  1. Patient evaluated. 2. Injection of 0.5cc Celestone soluspan injected into the left plantar fascia.  3.  Patient has history of acid reflux.  No NSAIDs prescribed today.  The patient does state that she does have some meloxicam at home.  She is going to start taking it.  Caution advised since history of acid reflux 4.  Patient has routine epidural lower back injections.  No cam boot dispensed since this may aggravate her lumbar spine 5.  Continue OTC arch supports  6.  Return to clinic in 4 weeks   Edrick Kins, DPM [facility]  Dr. Edrick Kins, DPM    [address], [place] [address]                Office [phone]  Fax [phone]

## 2021-06-05 ENCOUNTER — Ambulatory Visit: Payer: PPO | Admitting: Podiatry

## 2021-06-05 ENCOUNTER — Encounter: Payer: Self-pay | Admitting: Podiatry

## 2021-06-05 ENCOUNTER — Other Ambulatory Visit: Payer: Self-pay

## 2021-06-05 DIAGNOSIS — M722 Plantar fascial fibromatosis: Secondary | ICD-10-CM | POA: Diagnosis not present

## 2021-06-05 MED ORDER — BETAMETHASONE SOD PHOS & ACET 6 (3-3) MG/ML IJ SUSP
3.0000 mg | Freq: Once | INTRAMUSCULAR | Status: AC
  Administered 2021-06-05: 17:00:00 3 mg via INTRA_ARTICULAR

## 2021-06-05 NOTE — Progress Notes (Signed)
Subjective: 76 y.o. female presenting today for follow-up evaluation of plantar fasciitis to the left foot.  Patient states that the injections do help however now her pain is still to the lateral aspect of the heel.  She does not go barefoot and wears the arch supports as instructed.  No new complaints at this time.  She has not been taking the meloxicam   Past Medical History:  Diagnosis Date   Anxiety    CAD (coronary artery disease)    a. 11/2015 MV: mod mid-distal ant ischemia, EF 58% w/ apical HK;  b. 11/2015 Cath: LM nl, LAD 80/142m (fills via collats from RPDA & D1), D1 60ost, LCX small, nl, RCA 50p/m, RPDA nl, RPAV min irregs, RPl1/2/3 min irregs, EF 50-55%.   Cancer of brain [facility]) 03/2017   Dental bridge present    bilateral top.  Permanent retainer - bottom.   Dysuria    History of kidney stones    Hyperlipemia    Hypertension    Hypertensive heart disease    IBS (irritable bowel syndrome)    Ischemic cardiomyopathy    a. 11/2015 V gram: EF 50-55%; b. 09/2020 Echo: EF 35-40%, ant, antsept, apical HK. Gr1 DD. Nl RV fxn. Mild MR.   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 Still smoking ~ 1 cigarette/day.   Urethral caruncle    Vertigo    Vulvar adhesions 10/06/2013   perianal skin bridge noted at posterior fourchette and region of WLE surgical site   Vulvar leukoplakia    Vulvar pain    Past Surgical History:  Procedure Laterality Date   ABDOMINAL HERNIA REPAIR  2014   Dr. Rochel Brome   ABDOMINAL HYSTERECTOMY  1978   APPENDECTOMY     BRAIN SURGERY     CARDIAC CATHETERIZATION Left 12/07/2015   Procedure: Left Heart Cath and Coronary Angiography;  Surgeon: Wellington Hampshire, MD;  Location: [place] [facility];  Service: Cardiovascular;   Laterality: Left;   CATARACT EXTRACTION W/PHACO Right 05/28/2018   Procedure: CATARACT EXTRACTION PHACO AND INTRAOCULAR LENS PLACEMENT ([place]) RIGHT;  Surgeon: Marchia Meiers, MD;  Location: [facility] ORS;  Service: Ophthalmology;  Laterality: Right;  [place] 00:56 CDE 7.17 Fluid pack Lot # [id number]   CATARACT EXTRACTION W/PHACO Left 01/06/2019   Procedure: CATARACT EXTRACTION PHACO AND INTRAOCULAR LENS PLACEMENT ([facility]) LEFT  00:50.3  19.3%  9.71;  Surgeon: Leandrew Koyanagi, MD;  Location: [place];  Service: Ophthalmology;  Laterality: Left;   CERVICAL BIOPSY     Dr. Enzo Bi   CHOLECYSTECTOMY     COLONOSCOPY WITH PROPOFOL N/A 02/09/2019   Procedure: COLONOSCOPY WITH PROPOFOL;  Surgeon: Lollie Sails, MD;  Location: [facility] ENDOSCOPY;  Service: Endoscopy;  Laterality: N/A;   ESOPHAGOGASTRODUODENOSCOPY (EGD) WITH PROPOFOL N/A 02/09/2019   Procedure: ESOPHAGOGASTRODUODENOSCOPY (EGD) WITH PROPOFOL;  Surgeon: Lollie Sails, MD;  Location: [facility] ENDOSCOPY;  Service: Endoscopy;  Laterality: N/A;   MOHS SURGERY Right    Leg   RIGHT/LEFT HEART CATH AND CORONARY ANGIOGRAPHY N/A 10/23/2020   Procedure: RIGHT/LEFT HEART CATH AND CORONARY ANGIOGRAPHY;  Surgeon: Wellington Hampshire, MD;  Location: [place] [facility];  Service: Cardiovascular;  Laterality: N/A;   SKIN BIOPSY  wide local excision  07/2013   vin 3 w/ margins involoved   Allergies  Allergen Reactions   Tape Rash    bruises   Codeine Nausea And Vomiting   Demerol [Meperidine] Other (See Comments)    Heavy sedation   Erythromycin Nausea Only    "All Mycins cause nausea"   Iodinated Contrast Media Nausea And Vomiting   Other Other (See Comments)   Penicillins Nausea Only    Can do amoxicillin Has patient had a PCN reaction causing immediate rash, facial/tongue/throat swelling, SOB or lightheadedness with hypotension: Yes Has patient had a PCN reaction causing severe rash involving mucus membranes or skin necrosis: No Has  patient had a PCN reaction that required hospitalization No Has patient had a PCN reaction occurring within the last 10 years: No If all of the above answers are "NO", then may proceed with Cephalosporin use.    Entresto [Sacubitril-Valsartan] Rash     Objective: Physical Exam General: The patient is alert and oriented x3 in no acute distress.  Dermatology: Skin is warm, dry and supple bilateral lower extremities. Negative for open lesions or macerations bilateral.   Vascular: Dorsalis Pedis and Posterior Tibial pulses palpable bilateral.  Capillary fill time is immediate to all digits.  Neurological: Epicritic and protective threshold intact bilateral.   Musculoskeletal: There continues to be tenderness to palpation to the lateral plantar aspect of the left heel along the plantar fascia. All other joints range of motion within normal limits bilateral. Strength 5/5 in all groups bilateral.   Assessment: 1. Plantar fasciitis left foot; lateral aspect  Plan of Care:  1. Patient evaluated. 2. Injection of 0.5cc Celestone soluspan injected into the left plantar fascia.  3.  Patient has history of acid reflux.  No NSAIDs prescribed today.  The patient states that she does have a prednisone pack that is unused at her home prescribed by her PCP.  She is going to take it.   4.  Patient has routine epidural lower back injections.  No cam boot dispensed since this may aggravate her lumbar spine 5.  Continue OTC arch supports  6.  Return to clinic in 4 weeks   Edrick Kins, DPM [facility]  Dr. Edrick Kins, DPM    [address], [place] [address]                Office [phone]  Fax [phone]

## 2021-06-12 DIAGNOSIS — L570 Actinic keratosis: Secondary | ICD-10-CM | POA: Diagnosis not present

## 2021-06-12 DIAGNOSIS — L820 Inflamed seborrheic keratosis: Secondary | ICD-10-CM | POA: Diagnosis not present

## 2021-06-12 DIAGNOSIS — L578 Other skin changes due to chronic exposure to nonionizing radiation: Secondary | ICD-10-CM | POA: Diagnosis not present

## 2021-06-12 DIAGNOSIS — L821 Other seborrheic keratosis: Secondary | ICD-10-CM | POA: Diagnosis not present

## 2021-06-12 DIAGNOSIS — D492 Neoplasm of unspecified behavior of bone, soft tissue, and skin: Secondary | ICD-10-CM | POA: Diagnosis not present

## 2021-06-12 DIAGNOSIS — L814 Other melanin hyperpigmentation: Secondary | ICD-10-CM | POA: Diagnosis not present

## 2021-06-12 DIAGNOSIS — D229 Melanocytic nevi, unspecified: Secondary | ICD-10-CM | POA: Diagnosis not present

## 2021-06-12 DIAGNOSIS — L818 Other specified disorders of pigmentation: Secondary | ICD-10-CM | POA: Diagnosis not present

## 2021-06-12 DIAGNOSIS — L858 Other specified epidermal thickening: Secondary | ICD-10-CM | POA: Diagnosis not present

## 2021-06-13 ENCOUNTER — Other Ambulatory Visit: Payer: Self-pay | Admitting: Cardiovascular Disease

## 2021-06-13 DIAGNOSIS — E785 Hyperlipidemia, unspecified: Secondary | ICD-10-CM

## 2021-06-18 DIAGNOSIS — M7061 Trochanteric bursitis, right hip: Secondary | ICD-10-CM | POA: Diagnosis not present

## 2021-06-18 DIAGNOSIS — M7062 Trochanteric bursitis, left hip: Secondary | ICD-10-CM | POA: Diagnosis not present

## 2021-06-27 DIAGNOSIS — K429 Umbilical hernia without obstruction or gangrene: Secondary | ICD-10-CM | POA: Diagnosis not present

## 2021-06-28 DIAGNOSIS — I739 Peripheral vascular disease, unspecified: Secondary | ICD-10-CM | POA: Diagnosis not present

## 2021-06-28 DIAGNOSIS — F17200 Nicotine dependence, unspecified, uncomplicated: Secondary | ICD-10-CM | POA: Diagnosis not present

## 2021-06-28 DIAGNOSIS — I2510 Atherosclerotic heart disease of native coronary artery without angina pectoris: Secondary | ICD-10-CM | POA: Diagnosis not present

## 2021-06-28 DIAGNOSIS — I509 Heart failure, unspecified: Secondary | ICD-10-CM | POA: Diagnosis not present

## 2021-07-04 DIAGNOSIS — B028 Zoster with other complications: Secondary | ICD-10-CM | POA: Diagnosis not present

## 2021-07-04 DIAGNOSIS — B0231 Zoster conjunctivitis: Secondary | ICD-10-CM | POA: Diagnosis not present

## 2021-07-10 DIAGNOSIS — B029 Zoster without complications: Secondary | ICD-10-CM | POA: Diagnosis not present

## 2021-07-11 DIAGNOSIS — B028 Zoster with other complications: Secondary | ICD-10-CM | POA: Diagnosis not present

## 2021-07-18 DIAGNOSIS — M5416 Radiculopathy, lumbar region: Secondary | ICD-10-CM | POA: Diagnosis not present

## 2021-07-18 DIAGNOSIS — M48062 Spinal stenosis, lumbar region with neurogenic claudication: Secondary | ICD-10-CM | POA: Diagnosis not present

## 2021-08-14 DIAGNOSIS — L821 Other seborrheic keratosis: Secondary | ICD-10-CM | POA: Diagnosis not present

## 2021-08-14 DIAGNOSIS — L814 Other melanin hyperpigmentation: Secondary | ICD-10-CM | POA: Diagnosis not present

## 2021-08-14 DIAGNOSIS — D229 Melanocytic nevi, unspecified: Secondary | ICD-10-CM | POA: Diagnosis not present

## 2021-08-14 DIAGNOSIS — L570 Actinic keratosis: Secondary | ICD-10-CM | POA: Diagnosis not present

## 2021-08-14 DIAGNOSIS — L578 Other skin changes due to chronic exposure to nonionizing radiation: Secondary | ICD-10-CM | POA: Diagnosis not present

## 2021-08-14 DIAGNOSIS — L853 Xerosis cutis: Secondary | ICD-10-CM | POA: Diagnosis not present

## 2021-08-14 DIAGNOSIS — Z85828 Personal history of other malignant neoplasm of skin: Secondary | ICD-10-CM | POA: Diagnosis not present

## 2021-09-03 DIAGNOSIS — H43813 Vitreous degeneration, bilateral: Secondary | ICD-10-CM | POA: Diagnosis not present

## 2021-09-04 ENCOUNTER — Encounter: Payer: Self-pay | Admitting: Obstetrics and Gynecology

## 2021-09-04 ENCOUNTER — Ambulatory Visit (INDEPENDENT_AMBULATORY_CARE_PROVIDER_SITE_OTHER): Payer: PPO | Admitting: Obstetrics and Gynecology

## 2021-09-04 ENCOUNTER — Other Ambulatory Visit (HOSPITAL_COMMUNITY)
Admission: RE | Admit: 2021-09-04 | Discharge: 2021-09-04 | Disposition: A | Discharge status: Home / Self Care | Payer: PPO | Source: Ambulatory Visit | Attending: Obstetrics and Gynecology | Admitting: Obstetrics and Gynecology

## 2021-09-04 VITALS — BP 109/73 | HR 80 | Ht 63.0 in | Wt 149.3 lb

## 2021-09-04 DIAGNOSIS — N949 Unspecified condition associated with female genital organs and menstrual cycle: Secondary | ICD-10-CM | POA: Diagnosis not present

## 2021-09-04 DIAGNOSIS — N898 Other specified noninflammatory disorders of vagina: Secondary | ICD-10-CM

## 2021-09-04 NOTE — Progress Notes (Signed)
Patient presents today due to vaginal itching, burning after urination and redness with white bumps all beginning 2-3 weeks ago. She states she recently finished a round of Valtrex a month ago , due to Shingles, and believes this is associated. Patient states she trying monistat for these concerns, did not help. Patient states no other questions or concerns at this time.  ?

## 2021-09-04 NOTE — Progress Notes (Signed)
HPI: ?     Susan Mcmillan is a 76 y.o. [id number] who LMP was No LMP recorded. Patient has had a hysterectomy. ? ?Subjective:  ? ?She presents today stating that 2 to 3 weeks ago she had significant erythema itching and burning of the labia and vulva.  This was much worse with urination.  She reports that she used Monistat cream that did not seem to help.  She then began using " baby Butt paste".  She says this has helped a great deal.  She says she also developed some white bumps on her labia that she is not sure if this is related. 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 ?She denies new sexual partners. ? ?  Hx: ?The following portions of the patient's history were reviewed and updated as appropriate: ?            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 ?She does not have any pertinent problems on file. ?She  has a past surgical history that includes Cholecystectomy; Appendectomy; Abdominal hernia repair (2014); Cervical biopsy; Abdominal hysterectomy (1978); wide local excision (07/2013); Cardiac catheterization (Left, 12/07/2015); Brain surgery; Cataract extraction w/PHACO (Right, 05/28/2018); Cataract extraction w/PHACO (Left, 01/06/2019); Esophagogastroduodenoscopy (egd) with propofol (N/A, 02/09/2019); Colonoscopy with propofol (N/A, 02/09/2019); Skin biopsy; Mohs surgery (Right); and RIGHT/LEFT HEART CATH AND CORONARY ANGIOGRAPHY  (N/A, 10/23/2020). ?Her family history includes Bone cancer in her father; Cancer in her father; Congestive Heart Failure in her mother; Dementia in her sister; Hyperlipidemia in her sister; Hypertension in her sister; Macular degeneration in her sister. ?She  reports that she has been smoking cigarettes. She has a 13.75 pack-year smoking history. She has never used smokeless tobacco. She reports current alcohol use of about 1.0 standard drink per week. She reports that she does not use drugs. ?She has a current medication list which includes the following prescription(s): atorvastatin, vitamin b-12, diphenhydramine, empagliflozin, fluoxetine, hyoscyamine, metoprolol succinate, NONFORMULARY OR COMPOUNDED ITEM, OVER THE COUNTER MEDICATION, pantoprazole, and losartan. ?She is allergic to tape, codeine, demerol [meperidine], erythromycin, iodinated contrast media, other, penicillins, and entresto [sacubitril-valsartan]. ?      ?Review of Systems:  ?Review of Systems ? ?Constitutional: Denied constitutional symptoms, night sweats, recent illness, fatigue, fever, insomnia and weight loss.  ?Eyes: Denied eye symptoms, eye pain, photophobia, vision change and visual disturbance.  ?Ears/Nose/Throat/Neck: Denied ear, nose, throat or neck symptoms, hearing loss, nasal discharge, sinus congestion and sore throat.  ?Cardiovascular: Denied cardiovascular symptoms, arrhythmia, chest pain/pressure, edema, exercise intolerance, orthopnea and palpitations.  ?Respiratory: Denied pulmonary symptoms, asthma, pleuritic pain, productive sputum, cough, dyspnea and wheezing.  ?Gastrointestinal: Denied, gastro-esophageal reflux, melena, nausea and vomiting.  ?Genitourinary: See HPI for additional information.  ?Musculoskeletal: Denied musculoskeletal symptoms, stiffness, swelling, muscle weakness and myalgia.  ?Dermatologic: Denied dermatology symptoms, rash and scar.  ?Neurologic: Denied neurology symptoms, dizziness, headache, neck pain  and syncope.  ?Psychiatric: Denied psychiatric symptoms, anxiety and depression.  ?Endocrine: Denied endocrine symptoms including hot flashes and night sweats.  ? ?Meds: ?  ?Current Outpatient Medications on File Prior  to Visit  ?Medication Sig Dispense Refill  ? atorvastatin (LIPITOR) 80 MG tablet TAKE 1 TABLET(80 MG) BY MOUTH DAILY 90 tablet 1  ? Cyanocobalamin (VITAMIN B-12) 5000 MCG SUBL Place 5,000 mcg under the tongue daily as needed (low energy (feeling poorly)).    ? diphenhydrAMINE (BENADRYL) 25 MG tablet Take 25 mg by mouth at bedtime.    ? empagliflozin (JARDIANCE) 10 MG TABS tablet Take 1 tablet (10 mg total) by mouth daily before breakfast. 90 tablet 3  ? FLUoxetine (PROZAC) 20 MG capsule Take 20 mg by mouth daily.    ? hyoscyamine (LEVSIN SL) 0.125 MG SL tablet Place 0.125 mg under the tongue every 6 (six) hours as needed. Place 1 tablet under the tongue every 6 hours as needed for cramping for up to 10 days    ? metoprolol succinate (TOPROL-XL) 50 MG 24 hr tablet Take 50 mg by mouth in the morning.    ? NONFORMULARY OR COMPOUNDED ITEM Apply 1 application topically 2 (two) times daily as needed (cancer spots). FLUOROURACIL 5% + CALCIPOTRIENE 0.005%    ? OVER THE COUNTER MEDICATION BEET Capules    ? pantoprazole (PROTONIX) 40 MG tablet Take 40 mg by mouth 2 (two) times daily.    ? losartan (COZAAR) 25 MG tablet Take 1 tablet (25 mg total) by mouth daily. 90 tablet 3  ? ?No current facility-administered medications on file prior to visit.  ? ? ? ? ?Objective:  ?  ? ?Vitals:  ? 09/04/21 1550  ?BP: 109/73  ?Pulse: 80  ? ?Filed Weights  ? 09/04/21 1550  ?Weight: 149 lb 4.8 oz (67.7 kg)  ? ?  ?         Physical examination ?  Pelvic:   ?Vulva: Normal appearance.  No lesions.  Several small sebaceous cyst noted.  No other erythema or leukoplakia noted.  Remainder of vulvar looks entirely normal.  ?Vagina: No lesions or abnormalities noted.  ?Support: Normal pelvic support.  ?Urethra No masses tenderness or  scarring.  ?Meatus Normal size without lesions or prolapse.  ?Cervix: Normal appearance.  No lesions.  ?Anus: Normal exam.  No lesions.  ?Perineum: Normal exam.  No lesions.  ? ?        ? ?Assessment:  ?  ?G2P2002 ?Patient Active Problem List  ? Diagnosis Date Noted  ? Abnormal echocardiogram   ? History of lymphoma 06/06/2019  ? CAD (coronary artery disease)   ? Hypercholesteremia   ? Hyperlipidemia LDL goal <70   ? Hypertensive heart disease   ? IBS (irritable bowel syndrome)   ? Coronary artery disease involving native coronary artery of native heart with angina pectoris ([place])   ? Coronary artery disease due to lipid rich plaque 11/20/2015  ? Personal history of tobacco use, presenting hazards to health 07/10/2015  ? Routine general medical examination at a health care facility 05/17/2015  ? Constipation 02/07/2015  ? Cervical disc disorder with radiculopathy of cervical region 02/07/2015  ? VIN III (vulvar intraepithelial neoplasia III) 01/26/2015  ? Adjustment disorder with mixed anxiety and depressed mood 11/25/2014  ? 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 Numbness and tingling of right arm and leg 11/25/2014  ? Irritable bowel syndrome 11/18/2013  ? Overweight (BMI 25.0-29.9) 11/18/2013  ? Urethral caruncle 05/20/2013  ? Hot flashes 02/11/2013  ? Abnormal mammogram 08/07/2012  ? Nonspecific abnormal electrocardiogram (ECG) (EKG) 05/07/2012  ? Medicare annual wellness visit, subsequent 05/07/2012  ?  Tobacco abuse 05/07/2012  ? Hypertension 01/06/2012  ? Hyperlipidemia 01/06/2012  ? ?  ?1. Vaginal itching   ?2. Vaginal burning   ? ? Possible yeast infection treated by Monistat but thought not to be effective.   ?Sebaceous cyst not relevant to the itching. ?She reports she is much improved since originally having the problem. ?No evidence of return of VIN 3 ? ? ?Plan:  ?  ?       ? 1.  Expectant management.  Patient may continue to use" baby Butt paste" ? 2.  NuSwab performed  for BV and yeast. ?Orders ?No orders of the defined types were placed in this encounter. ? ? No orders of the defined types were placed in this encounter. ?  ?  F/U ? Return for Annual Physical. ?I spent 22

## 2021-09-06 ENCOUNTER — Other Ambulatory Visit: Payer: Self-pay

## 2021-09-06 DIAGNOSIS — B379 Candidiasis, unspecified: Secondary | ICD-10-CM

## 2021-09-06 LAB — CERVICOVAGINAL ANCILLARY ONLY
Bacterial Vaginitis (gardnerella): NEGATIVE
Candida Glabrata: POSITIVE — AB
Candida Vaginitis: POSITIVE — AB
Comment: NEGATIVE
Comment: NEGATIVE
Comment: NEGATIVE

## 2021-09-06 MED ORDER — FLUCONAZOLE 150 MG PO TABS: 150.0000 mg | ORAL_TABLET | ORAL | 3 refills | Status: DC

## 2021-09-17 DIAGNOSIS — B028 Zoster with other complications: Secondary | ICD-10-CM | POA: Diagnosis not present

## 2021-09-24 DIAGNOSIS — R7303 Prediabetes: Secondary | ICD-10-CM | POA: Diagnosis not present

## 2021-09-24 DIAGNOSIS — I10 Essential (primary) hypertension: Secondary | ICD-10-CM | POA: Diagnosis not present

## 2021-09-24 DIAGNOSIS — E782 Mixed hyperlipidemia: Secondary | ICD-10-CM | POA: Diagnosis not present

## 2021-10-01 DIAGNOSIS — I2583 Coronary atherosclerosis due to lipid rich plaque: Secondary | ICD-10-CM | POA: Diagnosis not present

## 2021-10-01 DIAGNOSIS — I2510 Atherosclerotic heart disease of native coronary artery without angina pectoris: Secondary | ICD-10-CM | POA: Diagnosis not present

## 2021-10-01 DIAGNOSIS — C8510 Unspecified B-cell lymphoma, unspecified site: Secondary | ICD-10-CM | POA: Diagnosis not present

## 2021-10-01 DIAGNOSIS — E782 Mixed hyperlipidemia: Secondary | ICD-10-CM | POA: Diagnosis not present

## 2021-10-01 DIAGNOSIS — Z1231 Encounter for screening mammogram for malignant neoplasm of breast: Secondary | ICD-10-CM | POA: Diagnosis not present

## 2021-10-01 DIAGNOSIS — D071 Carcinoma in situ of vulva: Secondary | ICD-10-CM | POA: Diagnosis not present

## 2021-10-01 DIAGNOSIS — I10 Essential (primary) hypertension: Secondary | ICD-10-CM | POA: Diagnosis not present

## 2021-10-01 DIAGNOSIS — Z0000 Encounter for general adult medical examination without abnormal findings: Secondary | ICD-10-CM | POA: Diagnosis not present

## 2021-10-01 DIAGNOSIS — R7303 Prediabetes: Secondary | ICD-10-CM | POA: Diagnosis not present

## 2021-10-01 DIAGNOSIS — D329 Benign neoplasm of meninges, unspecified: Secondary | ICD-10-CM | POA: Diagnosis not present

## 2021-10-02 ENCOUNTER — Other Ambulatory Visit: Payer: Self-pay | Admitting: Physician Assistant

## 2021-10-02 DIAGNOSIS — Z1231 Encounter for screening mammogram for malignant neoplasm of breast: Secondary | ICD-10-CM

## 2021-10-03 DIAGNOSIS — I739 Peripheral vascular disease, unspecified: Secondary | ICD-10-CM | POA: Diagnosis not present

## 2021-10-03 DIAGNOSIS — Z8679 Personal history of other diseases of the circulatory system: Secondary | ICD-10-CM | POA: Diagnosis not present

## 2021-10-03 DIAGNOSIS — I10 Essential (primary) hypertension: Secondary | ICD-10-CM | POA: Diagnosis not present

## 2021-10-03 DIAGNOSIS — I2510 Atherosclerotic heart disease of native coronary artery without angina pectoris: Secondary | ICD-10-CM | POA: Diagnosis not present

## 2021-10-03 DIAGNOSIS — F17200 Nicotine dependence, unspecified, uncomplicated: Secondary | ICD-10-CM | POA: Diagnosis not present

## 2021-10-04 DIAGNOSIS — M7061 Trochanteric bursitis, right hip: Secondary | ICD-10-CM | POA: Diagnosis not present

## 2021-10-04 DIAGNOSIS — M7062 Trochanteric bursitis, left hip: Secondary | ICD-10-CM | POA: Diagnosis not present

## 2021-10-10 ENCOUNTER — Telehealth: Payer: Self-pay | Admitting: Cardiovascular Disease

## 2021-10-10 ENCOUNTER — Encounter: Payer: Self-pay | Admitting: Cardiovascular Disease

## 2021-10-10 NOTE — Telephone Encounter (Signed)
Pt c/o medication issue:  1. Name of Medication: Jardiance  2. How are you currently taking this medication (dosage and times per day)? 1 time a day  3. Are you having a reaction (difficulty breathing--STAT)?   4. What is your medication issue? Vaginal infection, raw and itching- what can she take for this

## 2021-10-11 MED ORDER — FLUCONAZOLE 150 MG PO TABS: ORAL_TABLET | ORAL | 0 refills | Status: DC

## 2021-10-11 NOTE — Telephone Encounter (Signed)
Called patient and informed her of Dr. Tyrell Antonio recommendation as stated below. Patient stated that she is having to deal with this constantly and is so uncomfortable that she cannot even go to the grocery store. She would like to know what the alternative would be for her to take for her heart failure if she quits taking the Jardiance.  She wanted to know if she should try Entresto again and see if she does not get a rash this time.  I informed her that I would forward her concerns to Dr Fletcher Anon.

## 2021-10-11 NOTE — Telephone Encounter (Signed)
Unfortunately, Jardiance and Farxiga increase the risk of vaginal infections and UTIs.  Most of the time this is due to a yeast infection.  We can give her Diflucan 150 mg tablet x1 dose.  She should maintain good hygiene.  If infections become recurrent, we might have to stop the medication.

## 2021-10-14 NOTE — Telephone Encounter (Signed)
She can stop Jardiance for now.  Continue other medications and will discuss further options during her next follow-up.

## 2021-10-15 NOTE — Telephone Encounter (Signed)
She can stop Jardiance for now.  Continue other medications and will discuss further options during her next follow-up.

## 2021-10-15 NOTE — Telephone Encounter (Signed)
I called and spoke with the patient.  I advised her of Dr. Tyrell Antonio recommendations that she may: 1) STOP Jardiance 2) Continue all other current medication- further changes to be discussed at his ROV  The patient voices understanding of the above recommendations. However, she states she is very hesitant to stop her heart medication. She feels better after taking a 1 x dose of diflucan. She ordered a homeopathic medication>> Yeast Guard to take once daily. She confirms she is having no current symptoms of yeast infection taking this with the Jardiance.  She would like to continue with Jardiance and Yeast Guard for now and see how she does.   I have advised the patient that recurrence of yeast infection is possible on the Jardiance and to keep [place] updated as to how she feeling. Otherwise she will follow up with Christell Faith, PA on 11/06/21 as scheduled.   She was very appreciative of the call back.   To Dr. Fletcher Anon as an [place] only.

## 2021-10-15 NOTE — Telephone Encounter (Signed)
Ok thanks 

## 2021-10-18 DIAGNOSIS — B0233 Zoster keratitis: Secondary | ICD-10-CM | POA: Diagnosis not present

## 2021-10-19 ENCOUNTER — Ambulatory Visit
Admission: RE | Admit: 2021-10-19 | Discharge: 2021-10-19 | Disposition: A | Discharge status: Home / Self Care | Payer: PPO | Source: Ambulatory Visit | Attending: Physician Assistant | Admitting: Physician Assistant

## 2021-10-19 DIAGNOSIS — Z1231 Encounter for screening mammogram for malignant neoplasm of breast: Secondary | ICD-10-CM | POA: Insufficient documentation

## 2021-10-26 DIAGNOSIS — M48062 Spinal stenosis, lumbar region with neurogenic claudication: Secondary | ICD-10-CM | POA: Diagnosis not present

## 2021-10-26 DIAGNOSIS — M5416 Radiculopathy, lumbar region: Secondary | ICD-10-CM | POA: Diagnosis not present

## 2021-11-06 ENCOUNTER — Ambulatory Visit (INDEPENDENT_AMBULATORY_CARE_PROVIDER_SITE_OTHER): Payer: PPO | Admitting: Physician Assistant

## 2021-11-06 ENCOUNTER — Encounter: Payer: Self-pay | Admitting: Physician Assistant

## 2021-11-06 VITALS — BP 110/70 | HR 79 | Ht 62.0 in | Wt 153.0 lb

## 2021-11-06 DIAGNOSIS — I255 Ischemic cardiomyopathy: Secondary | ICD-10-CM

## 2021-11-06 DIAGNOSIS — I5022 Chronic systolic (congestive) heart failure: Secondary | ICD-10-CM

## 2021-11-06 DIAGNOSIS — I10 Essential (primary) hypertension: Secondary | ICD-10-CM

## 2021-11-06 DIAGNOSIS — I70208 Unspecified atherosclerosis of native arteries of extremities, other extremity: Secondary | ICD-10-CM | POA: Diagnosis not present

## 2021-11-06 DIAGNOSIS — Z720 Tobacco use: Secondary | ICD-10-CM

## 2021-11-06 DIAGNOSIS — E785 Hyperlipidemia, unspecified: Secondary | ICD-10-CM

## 2021-11-06 DIAGNOSIS — I2510 Atherosclerotic heart disease of native coronary artery without angina pectoris: Secondary | ICD-10-CM | POA: Diagnosis not present

## 2021-11-06 MED ORDER — METOPROLOL SUCCINATE ER 25 MG PO TB24: 12.5000 mg | ORAL_TABLET | Freq: Every day | ORAL | 3 refills | Status: DC

## 2021-11-09 DIAGNOSIS — B0233 Zoster keratitis: Secondary | ICD-10-CM | POA: Diagnosis not present

## 2021-11-22 DIAGNOSIS — M9902 Segmental and somatic dysfunction of thoracic region: Secondary | ICD-10-CM | POA: Diagnosis not present

## 2021-11-22 DIAGNOSIS — M9903 Segmental and somatic dysfunction of lumbar region: Secondary | ICD-10-CM | POA: Diagnosis not present

## 2021-11-22 DIAGNOSIS — M5417 Radiculopathy, lumbosacral region: Secondary | ICD-10-CM | POA: Diagnosis not present

## 2021-11-22 DIAGNOSIS — M9901 Segmental and somatic dysfunction of cervical region: Secondary | ICD-10-CM | POA: Diagnosis not present

## 2021-11-22 DIAGNOSIS — M9904 Segmental and somatic dysfunction of sacral region: Secondary | ICD-10-CM | POA: Diagnosis not present

## 2021-11-22 DIAGNOSIS — M9905 Segmental and somatic dysfunction of pelvic region: Secondary | ICD-10-CM | POA: Diagnosis not present

## 2021-11-22 DIAGNOSIS — S335XXA Sprain of ligaments of lumbar spine, initial encounter: Secondary | ICD-10-CM | POA: Diagnosis not present

## 2021-11-27 DIAGNOSIS — Z85828 Personal history of other malignant neoplasm of skin: Secondary | ICD-10-CM | POA: Diagnosis not present

## 2021-11-27 DIAGNOSIS — Z1283 Encounter for screening for malignant neoplasm of skin: Secondary | ICD-10-CM | POA: Diagnosis not present

## 2021-11-27 DIAGNOSIS — L814 Other melanin hyperpigmentation: Secondary | ICD-10-CM | POA: Diagnosis not present

## 2021-11-27 DIAGNOSIS — L570 Actinic keratosis: Secondary | ICD-10-CM | POA: Diagnosis not present

## 2021-11-27 DIAGNOSIS — D229 Melanocytic nevi, unspecified: Secondary | ICD-10-CM | POA: Diagnosis not present

## 2021-11-27 DIAGNOSIS — L578 Other skin changes due to chronic exposure to nonionizing radiation: Secondary | ICD-10-CM | POA: Diagnosis not present

## 2021-11-27 DIAGNOSIS — I10 Essential (primary) hypertension: Secondary | ICD-10-CM | POA: Diagnosis not present

## 2021-11-27 DIAGNOSIS — F17200 Nicotine dependence, unspecified, uncomplicated: Secondary | ICD-10-CM | POA: Diagnosis not present

## 2021-11-27 DIAGNOSIS — L821 Other seborrheic keratosis: Secondary | ICD-10-CM | POA: Diagnosis not present

## 2021-11-27 DIAGNOSIS — Z79899 Other long term (current) drug therapy: Secondary | ICD-10-CM | POA: Diagnosis not present

## 2021-12-03 DIAGNOSIS — M9901 Segmental and somatic dysfunction of cervical region: Secondary | ICD-10-CM | POA: Diagnosis not present

## 2021-12-03 DIAGNOSIS — M9903 Segmental and somatic dysfunction of lumbar region: Secondary | ICD-10-CM | POA: Diagnosis not present

## 2021-12-03 DIAGNOSIS — M5417 Radiculopathy, lumbosacral region: Secondary | ICD-10-CM | POA: Diagnosis not present

## 2021-12-03 DIAGNOSIS — M9902 Segmental and somatic dysfunction of thoracic region: Secondary | ICD-10-CM | POA: Diagnosis not present

## 2021-12-03 DIAGNOSIS — M9905 Segmental and somatic dysfunction of pelvic region: Secondary | ICD-10-CM | POA: Diagnosis not present

## 2021-12-03 DIAGNOSIS — S335XXA Sprain of ligaments of lumbar spine, initial encounter: Secondary | ICD-10-CM | POA: Diagnosis not present

## 2021-12-03 DIAGNOSIS — M9904 Segmental and somatic dysfunction of sacral region: Secondary | ICD-10-CM | POA: Diagnosis not present

## 2021-12-04 ENCOUNTER — Inpatient Hospital Stay: Payer: PPO | Attending: Oncology

## 2021-12-04 ENCOUNTER — Ambulatory Visit
Admission: RE | Admit: 2021-12-04 | Discharge: 2021-12-04 | Disposition: A | Discharge status: Home / Self Care | Payer: PPO | Source: Ambulatory Visit | Attending: Oncology | Admitting: Oncology

## 2021-12-04 DIAGNOSIS — Z8572 Personal history of non-Hodgkin lymphomas: Secondary | ICD-10-CM | POA: Insufficient documentation

## 2021-12-04 DIAGNOSIS — Z9889 Other specified postprocedural states: Secondary | ICD-10-CM | POA: Diagnosis not present

## 2021-12-04 LAB — CBC WITH DIFFERENTIAL/PLATELET
Abs Immature Granulocytes: 0.01 10*3/uL (ref 0.00–0.07)
Basophils Absolute: 0.1 10*3/uL (ref 0.0–0.1)
Basophils Relative: 1 %
Eosinophils Absolute: 0.2 10*3/uL (ref 0.0–0.5)
Eosinophils Relative: 2 %
HCT: 44.1 % (ref 36.0–46.0)
Hemoglobin: 14.2 g/dL (ref 12.0–15.0)
Immature Granulocytes: 0 %
Lymphocytes Relative: 16 %
Lymphs Abs: 1.1 10*3/uL (ref 0.7–4.0)
MCH: 29 pg (ref 26.0–34.0)
MCHC: 32.2 g/dL (ref 30.0–36.0)
MCV: 90 fL (ref 80.0–100.0)
Monocytes Absolute: 0.6 10*3/uL (ref 0.1–1.0)
Monocytes Relative: 9 %
Neutro Abs: 4.7 10*3/uL (ref 1.7–7.7)
Neutrophils Relative %: 72 %
Platelets: 279 10*3/uL (ref 150–400)
RBC: 4.9 MIL/uL (ref 3.87–5.11)
RDW: 14.6 % (ref 11.5–15.5)
WBC: 6.5 10*3/uL (ref 4.0–10.5)
nRBC: 0 % (ref 0.0–0.2)

## 2021-12-04 LAB — COMPREHENSIVE METABOLIC PANEL
ALT: 19 U/L (ref 0–44)
AST: 22 U/L (ref 15–41)
Albumin: 3.9 g/dL (ref 3.5–5.0)
Alkaline Phosphatase: 82 U/L (ref 38–126)
Anion gap: 3 — ABNORMAL LOW (ref 5–15)
BUN: 15 mg/dL (ref 8–23)
CO2: 26 mmol/L (ref 22–32)
Calcium: 8.9 mg/dL (ref 8.9–10.3)
Chloride: 109 mmol/L (ref 98–111)
Creatinine, Ser: 1.06 mg/dL — ABNORMAL HIGH (ref 0.44–1.00)
GFR, Estimated: 55 mL/min — ABNORMAL LOW (ref >60)
Glucose, Bld: 99 mg/dL (ref 70–99)
Potassium: 4.2 mmol/L (ref 3.5–5.1)
Sodium: 138 mmol/L (ref 135–145)
Total Bilirubin: 0.4 mg/dL (ref 0.3–1.2)
Total Protein: 7.2 g/dL (ref 6.5–8.1)

## 2021-12-04 LAB — LACTATE DEHYDROGENASE: LDH: 172 U/L (ref 98–192)

## 2021-12-04 MED ORDER — GADOBUTROL 1 MMOL/ML IV SOLN
6.0000 mL | Freq: Once | INTRAVENOUS | Status: AC | PRN
  Administered 2021-12-04: 6 mL via INTRAVENOUS

## 2021-12-05 ENCOUNTER — Other Ambulatory Visit: Payer: Self-pay | Admitting: Cardiovascular Disease

## 2021-12-05 DIAGNOSIS — E785 Hyperlipidemia, unspecified: Secondary | ICD-10-CM

## 2021-12-07 ENCOUNTER — Inpatient Hospital Stay: Payer: PPO | Admitting: Medical Oncology

## 2021-12-10 DIAGNOSIS — M9902 Segmental and somatic dysfunction of thoracic region: Secondary | ICD-10-CM | POA: Diagnosis not present

## 2021-12-10 DIAGNOSIS — M5417 Radiculopathy, lumbosacral region: Secondary | ICD-10-CM | POA: Diagnosis not present

## 2021-12-10 DIAGNOSIS — M9903 Segmental and somatic dysfunction of lumbar region: Secondary | ICD-10-CM | POA: Diagnosis not present

## 2021-12-10 DIAGNOSIS — M9905 Segmental and somatic dysfunction of pelvic region: Secondary | ICD-10-CM | POA: Diagnosis not present

## 2021-12-10 DIAGNOSIS — S335XXA Sprain of ligaments of lumbar spine, initial encounter: Secondary | ICD-10-CM | POA: Diagnosis not present

## 2021-12-10 DIAGNOSIS — M9901 Segmental and somatic dysfunction of cervical region: Secondary | ICD-10-CM | POA: Diagnosis not present

## 2021-12-10 DIAGNOSIS — M9904 Segmental and somatic dysfunction of sacral region: Secondary | ICD-10-CM | POA: Diagnosis not present

## 2021-12-11 ENCOUNTER — Encounter: Payer: Self-pay | Admitting: Nurse Practitioner

## 2021-12-11 ENCOUNTER — Inpatient Hospital Stay: Payer: PPO | Attending: Oncology | Admitting: Nurse Practitioner

## 2021-12-11 VITALS — BP 126/75 | HR 77 | Resp 18 | Wt 153.0 lb

## 2021-12-11 DIAGNOSIS — R42 Dizziness and giddiness: Secondary | ICD-10-CM | POA: Diagnosis not present

## 2021-12-11 DIAGNOSIS — R55 Syncope and collapse: Secondary | ICD-10-CM | POA: Insufficient documentation

## 2021-12-11 DIAGNOSIS — Z8572 Personal history of non-Hodgkin lymphomas: Secondary | ICD-10-CM | POA: Insufficient documentation

## 2021-12-11 DIAGNOSIS — F17210 Nicotine dependence, cigarettes, uncomplicated: Secondary | ICD-10-CM | POA: Diagnosis not present

## 2021-12-11 NOTE — Progress Notes (Signed)
Patient reports that this morning she blacked out and fell and hit the left side of her ear/head. She still feels a little dizzy, Beckey Rutter made aware.

## 2021-12-11 NOTE — Progress Notes (Signed)
Hematology/Mcm[facility] Follow Up Note Susan Mcmillan [phone] [phone]   Patient Care Team: Mcmillan, Susan K, MD as PCP - General (Physician Assistant) Mcmillan, Susan A, MD as PCP - Cardiology (Cardiology)  REFERRING PROVIDER: McLaughlin, Susan K, MD  REASON FOR VISIT:  Follow up for history of dural based lymphoma  HISTORY OF PRESENTING ILLNESS:  Susan Mcmillan is Mcmillan  75 y.o.  female with PMH listed below who was referred to me for evaluation of lymphoma  Patient previously followed by Susan Mcmillan.   Patient initially presented with intermittent right arm/neck tingling, weakness dating back to 2016.  Initial brain imaging demonstrated Mcmillan left side dural based lesion which was not most consistent with meningioma.  This was monitored.  In the fall 2018, these tingling episodes worsened in frequency and severity, going down on her trunk into her right leg.  Patient saw her primary care physician who performed another MRI which showed larger dural based lesion.  Patient was evaluated by Dr. Cook who took her to the OR on 03/27/2017 where she underwent Mcmillan subtotal resection.  She tolerated surgery well.  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 No evidence of MALT1 (18q21) rearrangement   Further staging included Mcmillan PET scan and the bone marrow biopsy were negative.  Patient finished adjuvant radiation in February 2019.  She followed up with Susan Mcmillan Dr. Johnson who recommended surveillance with MRI.  Patient was last seen by Dr. Johnson on 12/04/2017 and at that time she was recommended to repeat MRI in 4 months and follow-up in the clinic. Patient reports that he was never called for an appointment lost follow-up since then.  Not on antiseizure medication. Saw pcp who recommended patient be followed by Susan Mcmillan. She has chronic right sided numbness, tingling, and weakness, better since surgery but not yet at baseline. Unsteady gait at times.   #Susan screening, patient smokes couple of cigarettes Mcmillan day, longstanding smoking history since age of 14.  Has been with [facility].    #She drinks 8 ounces of Budweiser every afternoon.  Current someday smoker, Mcmillan few cigarettes daily. 27.5 pack year smoking  # Hx of IBS with weight fluctuation   04/02/2018 bilateral screening mammogram-BI-RADS 1 12/04/2017 MRI brain with and without contrast Equivalent and nonspecific increased cranial bone enhancement at the craniotomy site compared to 07/31/2017.  Small left frontal subdural collection subjacent to craniotomy site has almost completely resolved.  Stable small subdural enhancement subjacent to the craniotomy site.  No obvious new intra-axial abnormality. 07/31/2017 MRI with and without contrast 01/17/2017 MRI brain with and without contrast Interval resection of the dural based enhancing mass on the left frontal lobe, residual same linear dural enhancement seen along the left anterior and parietal lobes, could represent residual lymphoma.  Contacted postsurgical changes with small subdural collection along the left frontal lobe. Extra axial enhancing mass overlying the left hemisphere measuring up to 12 mm in thickness compatible with meningioma.  This shows significant enlargement since 2016.  There is mass-effect on the overlying cortex but no shift of the midline structures.  No brain edema.  08/31/2018 MRI brain with and without contrast showed status post resection of extra axial left convexity tumor without residual lesion.  Otherwise normal brain MRI.  INTERVAL HISTORY Susan Mcmillan is Mcmillan 75 y.o. female who has above history reviewed by me today presents for follow up   visit for history of CNS lymphoma. Reportedly doing well. No headaches, shortness of breath, cough, unintentional weight  loss, fevers, chills, drenching night sweats. She has chronic balance issues but this morning after showering she lost consciousness and fell and hit the left side of her ear/head. She's never experienced syncope in the past. Denies injury.   Review of Systems  Constitutional:  Negative for appetite change, chills, fatigue and fever.  HENT:   Negative for hearing loss and voice change.   Eyes:  Negative for eye problems.  Respiratory:  Positive for shortness of breath. Negative for chest tightness and cough.   Cardiovascular:  Negative for chest pain.  Gastrointestinal:  Negative for abdominal distention, abdominal pain and blood in stool.  Endocrine: Negative for hot flashes.  Genitourinary:  Negative for difficulty urinating and frequency.   Musculoskeletal:  Negative for arthralgias.       Chronic right-sided weakness/tingling tremor  Skin:  Negative for itching and rash.  Neurological:  Negative for extremity weakness, headaches, seizures and speech difficulty.  Hematological:  Negative for adenopathy.  Psychiatric/Behavioral:  Negative for confusion.     MEDICAL HISTORY:  Past Medical History:  Diagnosis Date   Anxiety    CAD (coronary artery disease)    Mcmillan. 11/2015 MV: mod mid-distal ant ischemia, EF 58% w/ apical HK;  b. 11/2015 Cath: LM nl, LAD 80/100m (fills via collats from RPDA & D1), D1 60ost, LCX small, nl, RCA 50p/m, RPDA nl, RPAV min irregs, RPl1/2/3 min irregs, EF 50-55%.   Susan of brain (HCC) 03/2017   Dental bridge present    bilateral top.  Permanent retainer - bottom.   Dysuria    History of kidney stones    Hyperlipemia    Hypertension    Hypertensive heart disease    IBS (irritable bowel syndrome)    Ischemic cardiomyopathy    Mcmillan. 11/2015 V gram: EF 50-55%; b. 09/2020 Echo: EF 35-40%, ant, antsept, apical HK. Gr1 DD. Nl RV fxn. Mild MR.   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 Still smoking ~ 1 cigarette/day.   Urethral caruncle    Vertigo    Vulvar adhesions 10/06/2013   perianal skin bridge noted at posterior fourchette and region of WLE surgical site   Vulvar leukoplakia    Vulvar pain     SURGICAL HISTORY: Past Surgical History:  Procedure Laterality Date   ABDOMINAL HERNIA REPAIR  2014   Dr. Wilton Smith   ABDOMINAL HYSTERECTOMY  1978   APPENDECTOMY     BRAIN SURGERY     CARDIAC CATHETERIZATION Left 12/07/2015   Procedure: Left Heart Cath and Coronary Angiography;  Surgeon: Susan Mcmillan Arida, MD;  Location: [facility];  Service: Cardiovascular;  Laterality: Left;   CATARACT EXTRACTION W/PHACO Right 05/28/2018   Procedure: CATARACT EXTRACTION PHACO AND INTRAOCULAR LENS PLACEMENT (IOC) RIGHT;  Surgeon: Harrow, Brian, MD;  Location: [facility];  Service: Ophthalmology;  Laterality: Right;  US 00:56 CDE 7.17 Fluid pack Lot # [id number]   CATARACT EXTRACTION W/PHACO Left 01/06/2019   Procedure: CATARACT EXTRACTION PHACO AND INTRAOCULAR LENS PLACEMENT (IOC) LEFT  00:50.3  19.3%  9.71;  Surgeon: Brasington, Chadwick, MD;  Location: [facility];    Service: Ophthalmology;  Laterality: Left;   CERVICAL BIOPSY     Dr. Enzo Bi   CHOLECYSTECTOMY     COLONOSCOPY WITH PROPOFOL N/Mcmillan 02/09/2019   Procedure: COLONOSCOPY WITH PROPOFOL;  Surgeon: Lollie Sails, MD;  Location: [facility];  Service: Endoscopy;  Laterality: N/Mcmillan;   ESOPHAGOGASTRODUODENOSCOPY (EGD) WITH PROPOFOL N/Mcmillan 02/09/2019   Procedure: ESOPHAGOGASTRODUODENOSCOPY (EGD) WITH PROPOFOL;  Surgeon: Lollie Sails, MD;  Location: [facility];  Service: Endoscopy;  Laterality: N/Mcmillan;   MOHS SURGERY Right    Leg   RIGHT/LEFT HEART CATH AND CORONARY ANGIOGRAPHY N/Mcmillan 10/23/2020   Procedure: RIGHT/LEFT HEART CATH AND CORONARY ANGIOGRAPHY;  Surgeon:  Wellington Hampshire, MD;  Location: [facility];  Service: Cardiovascular;  Laterality: N/Mcmillan;   SKIN BIOPSY     wide local excision  07/2013   vin 3 w/ margins involoved    SOCIAL HISTORY: Social History   Socioeconomic History   Marital status: Married    Spouse name: Geneticist, molecular   Number of children: 2   Years of education: Not on file   Highest education level: Not on file  Occupational History   Occupation: retired  Tobacco Use   Smoking status: Some Days    Packs/day: 0.25    Years: 55.00    Total pack years: 13.75    Types: Cigarettes    Last attempt to quit: 12/07/2015    Years since quitting: 6.0   Smokeless tobacco: Never   Tobacco comments:    smokes about 2 cigarettes Mcmillan day  Vaping Use   Vaping Use: Never used  Substance and Sexual Activity   Alcohol use: Yes    Alcohol/week: 1.0 standard drink of alcohol    Types: 1 Cans of beer per week    Comment: 8 oz Bud Light with supper daily   Drug use: No   Sexual activity: Not Currently    Birth control/protection: Surgical  Other Topics Concern   Not on file  Social History Narrative   Lives in [place]. Previously worked [employer]. Husband with dementia. Children 2 sons.   Social Determinants of Health   Financial Resource Strain: Not on file  Food Insecurity: Not on file  Transportation Needs: Not on file  Physical Activity: Not on file  Stress: Not on file  Social Connections: Not on file  Intimate Partner Violence: Not on file    FAMILY HISTORY: Family History  Problem Relation Age of Onset   Hyperlipidemia Sister    Hypertension Sister    Macular degeneration Sister    Dementia Sister    Congestive Heart Failure Mother    Bone Susan Father    Susan Father        blood Susan   Diabetes Neg Hx    Heart disease Neg Hx     ALLERGIES:  is allergic to tape, codeine, demerol [meperidine], erythromycin, iodinated contrast media, other, penicillins, and entresto  [sacubitril-valsartan].  MEDICATIONS:  Current Outpatient Medications  Medication Sig Dispense Refill   ALPRAZolam (XANAX) 0.25 MG tablet Take by mouth.     atorvastatin (LIPITOR) 80 MG tablet TAKE 1 TABLET(80 MG) BY MOUTH DAILY 90 tablet 1   Cyanocobalamin (VITAMIN B-12) 5000 MCG SUBL Place 5,000 mcg under the tongue daily as needed (low energy (feeling poorly)).     diphenhydrAMINE (BENADRYL) 25 MG tablet Take 25 mg by mouth at bedtime.     empagliflozin (JARDIANCE) 10 MG TABS tablet Take 1 tablet (10 mg total) by mouth daily before breakfast. 90 tablet  3   FLUoxetine (PROZAC) 20 MG capsule Take 20 mg by mouth daily.     losartan (COZAAR) 25 MG tablet Take 1 tablet (25 mg total) by mouth daily. 90 tablet 3   metoprolol succinate (TOPROL XL) 25 MG 24 hr tablet Take 0.5 tablets (12.5 mg total) by mouth daily. 45 tablet 3   OVER THE COUNTER MEDICATION BEET Capules on occasion     pantoprazole (PROTONIX) 40 MG tablet Take 40 mg by mouth 2 (two) times daily.     prednisoLONE acetate (PRED FORTE) 1 % ophthalmic suspension Right eye     hyoscyamine (LEVSIN SL) 0.125 MG SL tablet Place 0.125 mg under the tongue every 6 (six) hours as needed. Place 1 tablet under the tongue every 6 hours as needed for cramping for up to 10 days (Patient not taking: Reported on 12/11/2021)     NONFORMULARY OR COMPOUNDED ITEM Apply 1 application topically 2 (two) times daily as needed (Susan spots). FLUOROURACIL 5% + CALCIPOTRIENE 0.005% (Patient not taking: Reported on 12/11/2021)     No current facility-administered medications for this visit.     PHYSICAL EXAMINATION: ECOG PERFORMANCE STATUS: 1 - Symptomatic but completely ambulatory Vitals:   12/11/21 1430  BP: 126/75  Pulse: 77  Resp: 18  SpO2: 100%    Filed Weights   12/11/21 1430  Weight: 153 lb (69.4 kg)    Physical Exam Constitutional:      General: She is not in acute distress.    Appearance: She is well-developed. She is not ill-appearing.   HENT:     Head: Atraumatic.     Mouth/Throat:     Pharynx: No oropharyngeal exudate.  Eyes:     General: No scleral icterus. Cardiovascular:     Rate and Rhythm: Normal rate and regular rhythm.  Pulmonary:     Effort: Pulmonary effort is normal.     Breath sounds: Normal breath sounds.  Abdominal:     General: There is no distension.     Palpations: Abdomen is soft.     Tenderness: There is no abdominal tenderness.  Musculoskeletal:        General: No tenderness or deformity. Normal range of motion.     Cervical back: Normal range of motion and neck supple.  Skin:    General: Skin is warm and dry.     Coloration: Skin is not pale.  Neurological:     General: No focal deficit present.     Mental Status: She is alert and oriented to person, place, and time.  Psychiatric:        Mood and Affect: Mood normal.        Behavior: Behavior normal.   LABORATORY DATA:  I have reviewed the data as listed Lab Results  Component Value Date   WBC 6.5 12/04/2021   HGB 14.2 12/04/2021   HCT 44.1 12/04/2021   MCV 90.0 12/04/2021   PLT 279 12/04/2021   Recent Labs    12/04/21 1419  NA 138  K 4.2  CL 109  CO2 26  GLUCOSE 99  BUN 15  CREATININE 1.06*  CALCIUM 8.9  GFRNONAA 55*  PROT 7.2  ALBUMIN 3.9  AST 22  ALT 19  ALKPHOS 82  BILITOT 0.4    Iron/TIBC/Ferritin/ %Sat    Component Value Date/Time   FERRITIN 24.0 01/14/2012 1434     RADIOGRAPHIC STUDIES: I have personally reviewed the radiological images as listed and agreed with the findings in the report. MR Brain  W Wo Contrast  Result Date: 12/05/2021 CLINICAL DATA:  76 year old female with Mcmillan history of lymphoma, left convexity dural-based tumor resection. Restaging. EXAM: MRI HEAD WITHOUT AND WITH CONTRAST TECHNIQUE: Multiplanar, multiecho pulse sequences of the brain and surrounding structures were obtained without and with intravenous contrast. CONTRAST:  33m GADAVIST GADOBUTROL 1 MMOL/ML IV SOLN COMPARISON:   11/29/2020 brain MRI and earlier. FINDINGS: Brain: Smooth chronic dural thickening underlying left convexity craniotomy (series 1022, image 138) up to 2-3 mm in thickness (series 14, image 26), is stable since 2021. No increased or nodular dural thickening or enhancement. No abnormal intra-axial enhancement. No subsequent intracranial mass effect or midline shift. No restricted diffusion to suggest acute infarction. No ventriculomegaly, or acute intracranial hemorrhage. Cervicomedullary junction and pituitary are within normal limits. GPearline Cablesand white matter signal remains normal for age. No convincing encephalomalacia or chronic cerebral blood products. Vascular: Major intracranial vascular flow voids are stable. Dominant left vertebral artery. Skull and upper cervical spine: Chronic left side craniotomy. Background bone marrow signal stable and within normal limits. Negative visible cervical spine. Sinuses/Orbits: Stable and negative, chronic postoperative changes to both globes. Other: Mastoids remain clear. Visible internal auditory structures appear normal. Stable scalp postoperative changes IMPRESSION: 1. Stable and satisfactory post treatment appearance of the brain. Smooth chronic dural thickening underlying the left side craniotomy is stable since 2021. 2. No new intracranial abnormality. Electronically Signed   By: HGenevie AnnM.D.   On: 12/05/2021 08:52   MM 3D SCREEN BREAST BILATERAL  Result Date: 10/22/2021 CLINICAL DATA:  Screening. EXAM: DIGITAL SCREENING BILATERAL MAMMOGRAM WITH TOMOSYNTHESIS AND CAD TECHNIQUE: Bilateral screening digital craniocaudal and mediolateral oblique mammograms were obtained. Bilateral screening digital breast tomosynthesis was performed. The images were evaluated with computer-aided detection. COMPARISON:  Previous exam(s). ACR Breast Density Category b: There are scattered areas of fibroglandular density. FINDINGS: There are no findings suspicious for malignancy. IMPRESSION:  No mammographic evidence of malignancy. Mcmillan result letter of this screening mammogram will be mailed directly to the patient. RECOMMENDATION: Screening mammogram in one year. (Code:SM-B-01Y) BI-RADS CATEGORY  1: Negative. Electronically Signed   By: JMargarette CanadaM.D.   On: 10/22/2021 14:43    03/27/2017 Brain tumor biopsies showed mature B cell lymphoma with plasmacytic differentiation.  Favoring marginal zone lymphoma  ASSESSMENT & PLAN:  No diagnosis found.    Near Syncope/Dizziness- hx of vertigo but worse. I'm suspicious for Mcmillan cardiac etiology and recommended that she reach out to cardiology for evaluation. Advised her not to drive, change positions slowly, and risk of injury from falls. She had recent brain MRI which did not show any masses. Patient will contact her cardiologist for evaluation. If symptoms persist, she will notify [place] well. I recommended that she hold losartan until she has seen cardiology. Creatinine slightly elevated today. Encouraged hydration.  Hx of dural lymphoma- S/p craniotomy MRI brain from 12/04/21 was independently reviewed which showed stable post treatment appearance with chronic, smooth dural thickening post craniotomy. No new intracranial abnormalities. Labs reviewed and reassuring. She can continue surveillance at this time. Plan to repeat brain MRI for surveillance in 1 year.  Smoker- LDCT performed 01/09/21. Reminded patient that she will be due for annual scan later this month. Encouraged her to follow up with PCP for screening.   Disposition 1 year- MR Brain w wo contrast Follow up with Dr. YTasia Catchingsor myself few days to week later for continued surveillance   No orders of the defined types were placed in this  encounter.   All questions were answered. The patient knows to call the clinic with any problems questions or concerns.  Lauren Allen, DNP, AGNP-C Susan Mcmillan at [facility] [phone] (clinic) 12/11/2021  

## 2021-12-13 ENCOUNTER — Telehealth: Payer: Self-pay | Admitting: Cardiovascular Disease

## 2021-12-13 NOTE — Telephone Encounter (Signed)
Pt c/o medication issue:  1. Name of Medication: empagliflozin (JARDIANCE) 10 MG TABS tablet  2. How are you currently taking this medication (dosage and times per day)? As prescribed   3. Are you having a reaction (difficulty breathing--STAT)?   4. What is your medication issue? Pt states she has fallen into the "donut hole" with her insurance for this medication and now for a 30 day supply it will cost $170. She says this is not something that she can afford and would like to know what her options are. Please advise.

## 2021-12-14 NOTE — Telephone Encounter (Signed)
Spoke with patient and reviewed that we could do application for assistance. She was agreeable but would like [place] to mail application for her to sign and she will have it returned by either mail or someone dropping it off.

## 2021-12-14 NOTE — Telephone Encounter (Signed)
Noted.  If patient cannot qualify for free or reduced SGLT2 inhibitor, we will have to temporarily hold this medicine with consideration to escalate alternative therapy in follow-up.

## 2021-12-17 DIAGNOSIS — M9903 Segmental and somatic dysfunction of lumbar region: Secondary | ICD-10-CM | POA: Diagnosis not present

## 2021-12-17 DIAGNOSIS — M5417 Radiculopathy, lumbosacral region: Secondary | ICD-10-CM | POA: Diagnosis not present

## 2021-12-17 DIAGNOSIS — M9902 Segmental and somatic dysfunction of thoracic region: Secondary | ICD-10-CM | POA: Diagnosis not present

## 2021-12-17 DIAGNOSIS — M9905 Segmental and somatic dysfunction of pelvic region: Secondary | ICD-10-CM | POA: Diagnosis not present

## 2021-12-17 DIAGNOSIS — S335XXA Sprain of ligaments of lumbar spine, initial encounter: Secondary | ICD-10-CM | POA: Diagnosis not present

## 2021-12-17 DIAGNOSIS — M9901 Segmental and somatic dysfunction of cervical region: Secondary | ICD-10-CM | POA: Diagnosis not present

## 2021-12-17 DIAGNOSIS — M9904 Segmental and somatic dysfunction of sacral region: Secondary | ICD-10-CM | POA: Diagnosis not present

## 2021-12-18 DIAGNOSIS — M9905 Segmental and somatic dysfunction of pelvic region: Secondary | ICD-10-CM | POA: Diagnosis not present

## 2021-12-18 DIAGNOSIS — M9904 Segmental and somatic dysfunction of sacral region: Secondary | ICD-10-CM | POA: Diagnosis not present

## 2021-12-18 DIAGNOSIS — M9901 Segmental and somatic dysfunction of cervical region: Secondary | ICD-10-CM | POA: Diagnosis not present

## 2021-12-18 DIAGNOSIS — M5417 Radiculopathy, lumbosacral region: Secondary | ICD-10-CM | POA: Diagnosis not present

## 2021-12-18 DIAGNOSIS — M9902 Segmental and somatic dysfunction of thoracic region: Secondary | ICD-10-CM | POA: Diagnosis not present

## 2021-12-18 DIAGNOSIS — M9903 Segmental and somatic dysfunction of lumbar region: Secondary | ICD-10-CM | POA: Diagnosis not present

## 2021-12-18 DIAGNOSIS — S335XXA Sprain of ligaments of lumbar spine, initial encounter: Secondary | ICD-10-CM | POA: Diagnosis not present

## 2021-12-21 DIAGNOSIS — B0233 Zoster keratitis: Secondary | ICD-10-CM | POA: Diagnosis not present

## 2021-12-24 NOTE — Telephone Encounter (Signed)
Patient dropped off forms but they were not signed. Called and left voicemail message that forms need to be signed in order to be sent out.

## 2021-12-24 NOTE — Telephone Encounter (Signed)
Reviewed that we need her signatures on all pages marked. She will come by the office to sign and then place in my box. Left forms up front and reviewed with [facility]. All pages marked that need to be signed.

## 2021-12-24 NOTE — Telephone Encounter (Signed)
Patient dropped off forms  ?Placed in nurse box ?

## 2021-12-24 NOTE — Telephone Encounter (Signed)
Paperwork left on top of my cart if she returns to sign them.

## 2021-12-26 DIAGNOSIS — S335XXA Sprain of ligaments of lumbar spine, initial encounter: Secondary | ICD-10-CM | POA: Diagnosis not present

## 2021-12-26 DIAGNOSIS — M9904 Segmental and somatic dysfunction of sacral region: Secondary | ICD-10-CM | POA: Diagnosis not present

## 2021-12-26 DIAGNOSIS — M9903 Segmental and somatic dysfunction of lumbar region: Secondary | ICD-10-CM | POA: Diagnosis not present

## 2021-12-26 DIAGNOSIS — M5417 Radiculopathy, lumbosacral region: Secondary | ICD-10-CM | POA: Diagnosis not present

## 2021-12-26 DIAGNOSIS — M9902 Segmental and somatic dysfunction of thoracic region: Secondary | ICD-10-CM | POA: Diagnosis not present

## 2021-12-26 DIAGNOSIS — M9905 Segmental and somatic dysfunction of pelvic region: Secondary | ICD-10-CM | POA: Diagnosis not present

## 2021-12-26 DIAGNOSIS — M9901 Segmental and somatic dysfunction of cervical region: Secondary | ICD-10-CM | POA: Diagnosis not present

## 2021-12-28 NOTE — Telephone Encounter (Signed)
Application completed, faxed, and filed in samples closet

## 2022-01-01 NOTE — Telephone Encounter (Signed)
Reviewed with patient that letter was received shows that she has been approved from 01/01/22 to 05/12/22. Letter has been placed in samples closet.

## 2022-01-02 ENCOUNTER — Telehealth: Payer: Self-pay | Admitting: Cardiovascular Disease

## 2022-01-02 DIAGNOSIS — R55 Syncope and collapse: Secondary | ICD-10-CM | POA: Diagnosis not present

## 2022-01-02 NOTE — Telephone Encounter (Signed)
Pt states that she has been passing out lately. Pt states yesterday she got dizzy and passed out 4 different times. Pt says she does not know what may be causing her to pass out.  Pt would like a callback. Please advise

## 2022-01-02 NOTE — Telephone Encounter (Signed)
I called and spoke with the patient. She advised that a couple of weeks ago she took a shower, got out, went to the bed to turn on the TV, and sound herself sitting on the floor.  She thought the shower was too hot.  She states she has had episode of syncope/ dizziness at least 1-2 times/ day more frequently, but she can tell when her episodes are coming on.   She did have ~ 4 "episodes" yesterday of dizziness/ possible syncope. She did fall onto a plug in the wall yesterday and has a cut on her head from this.   She has had an episode of dizziness while brushing her teeth.  She feels dizzy and her legs are weak.   She has had one episode of dizziness/ syncope/ pre-syncope today. She thinks these lasts only minutes.    She has a wrist BP cuff and got a recent reading of a SBP of 96.  Episodes may be due to hypotension.  I have advised the patient I am concerned about her episodes and that she needs to come in for further evaluation. She is agreeable to seeing Christell Faith, PA tomorrow at 2:50 pm.   I have advised her if symptoms worsen in any way prior to her appointment, she should report to the ER ASAP for more immediate evaluation and possible treatment. She confirms she is not driving.  The patient voices understanding and is agreeable.

## 2022-01-03 ENCOUNTER — Ambulatory Visit (INDEPENDENT_AMBULATORY_CARE_PROVIDER_SITE_OTHER): Payer: PPO | Admitting: Physician Assistant

## 2022-01-03 ENCOUNTER — Ambulatory Visit (INDEPENDENT_AMBULATORY_CARE_PROVIDER_SITE_OTHER): Payer: PPO

## 2022-01-03 ENCOUNTER — Encounter: Payer: Self-pay | Admitting: Physician Assistant

## 2022-01-03 VITALS — BP 126/81 | HR 71 | Ht 62.0 in | Wt 152.4 lb

## 2022-01-03 DIAGNOSIS — M9901 Segmental and somatic dysfunction of cervical region: Secondary | ICD-10-CM | POA: Diagnosis not present

## 2022-01-03 DIAGNOSIS — I255 Ischemic cardiomyopathy: Secondary | ICD-10-CM

## 2022-01-03 DIAGNOSIS — Z720 Tobacco use: Secondary | ICD-10-CM

## 2022-01-03 DIAGNOSIS — M9902 Segmental and somatic dysfunction of thoracic region: Secondary | ICD-10-CM | POA: Diagnosis not present

## 2022-01-03 DIAGNOSIS — M9903 Segmental and somatic dysfunction of lumbar region: Secondary | ICD-10-CM | POA: Diagnosis not present

## 2022-01-03 DIAGNOSIS — R55 Syncope and collapse: Secondary | ICD-10-CM

## 2022-01-03 DIAGNOSIS — S40012A Contusion of left shoulder, initial encounter: Secondary | ICD-10-CM | POA: Diagnosis not present

## 2022-01-03 DIAGNOSIS — S60221A Contusion of right hand, initial encounter: Secondary | ICD-10-CM | POA: Diagnosis not present

## 2022-01-03 DIAGNOSIS — E785 Hyperlipidemia, unspecified: Secondary | ICD-10-CM | POA: Diagnosis not present

## 2022-01-03 DIAGNOSIS — I10 Essential (primary) hypertension: Secondary | ICD-10-CM | POA: Diagnosis not present

## 2022-01-03 DIAGNOSIS — S335XXA Sprain of ligaments of lumbar spine, initial encounter: Secondary | ICD-10-CM | POA: Diagnosis not present

## 2022-01-03 DIAGNOSIS — I2510 Atherosclerotic heart disease of native coronary artery without angina pectoris: Secondary | ICD-10-CM | POA: Diagnosis not present

## 2022-01-03 DIAGNOSIS — I70208 Unspecified atherosclerosis of native arteries of extremities, other extremity: Secondary | ICD-10-CM | POA: Diagnosis not present

## 2022-01-03 DIAGNOSIS — M9904 Segmental and somatic dysfunction of sacral region: Secondary | ICD-10-CM | POA: Diagnosis not present

## 2022-01-03 DIAGNOSIS — R42 Dizziness and giddiness: Secondary | ICD-10-CM

## 2022-01-03 DIAGNOSIS — M5417 Radiculopathy, lumbosacral region: Secondary | ICD-10-CM | POA: Diagnosis not present

## 2022-01-03 DIAGNOSIS — S0011XA Contusion of right eyelid and periocular area, initial encounter: Secondary | ICD-10-CM

## 2022-01-03 DIAGNOSIS — I5022 Chronic systolic (congestive) heart failure: Secondary | ICD-10-CM | POA: Diagnosis not present

## 2022-01-03 DIAGNOSIS — M9905 Segmental and somatic dysfunction of pelvic region: Secondary | ICD-10-CM | POA: Diagnosis not present

## 2022-01-03 NOTE — Progress Notes (Signed)
Cardiology Office Note    Date:  01/03/2022   ID:  [name], [name] Apr 02, 1946, MRN [id number]  PCP:  Marinda Elk, MD  Cardiologist:  Kathlyn Sacramento, MD  Electrophysiologist:  None   Chief Complaint: Dizziness/near syncope/syncope  History of Present Illness:   Susan Mcmillan is a 76 y.o. 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   She was found to have significant coronary artery calcification on CT imaging in 2017.  Nuclear stress test showed moderate mid to distal anterior wall ischemia with normal EF.  LHC showed an occluded mid LAD with right to left collaterals and left to left collaterals.  She otherwise had moderate RCA disease.  She was medically managed.  Carotid artery ultrasound in 04/2019 showed no significant carotid artery disease.  She had worsening heart failure earlier in 2022 with repeat echo in 09/2020 demonstrating an EF of 35 to 26%, grade 1 diastolic dysfunction, and mild mitral regurgitation.  Subsequent R/LHC in 10/2020 showed no significant change when compared to study in 2017 with chronically occluded mid LAD with collaterals as well as moderate proximal stenosis in the first D1 and moderate RCA disease.  RHC showed normal right and left-sided filling pressures, minimal pulmonary hypertension, and severely reduced cardiac output.  Titration of GDMT has been limited by low blood pressure as well as hypotension/rash associated with Entresto.  She was seen in the office in 04/2021 and reported no angina or symptoms concerning for decompensation.  Her biggest issue at that time continued to be dizziness and poor balance with a spinning sensation suspected to be likely vertigo.  She has subsequently dealt with recurrent vaginal infections/UTIs with  recommendation for her to stop Jardiance to decrease the risk of recurrent infections.  However, she preferred to continue Jardiance along with a self recommended homeopathic medication.  She was last seen in the office in 10/2021 and remained without symptoms of angina or decompensation.  She had discontinued metoprolol secondary to positional dizziness with noted improvement in symptoms thereafter.  She was without symptoms of presyncope or syncope.  She remains on Jardiance and losartan.  She contacted our office on 01/02/2022 noting multiple episodes of dizziness with near syncope, along with generalized weakness and falls.  Telephone note indicates a systolic blood pressure of 96 mmHg.  Given this, appointment was scheduled for today.  She comes in today accompanied by her husband.  Over the past several weeks she has had multiple episodes of dizziness with near syncope and 2-3 episodes of frank syncope.  Symptoms are sudden in onset.  No associated chest discomfort, dyspnea, palpitations, diaphoresis, nausea, or vomiting.  No seizure-like activity.  No postictal period.  No room spinning sensation.  Most recently, she suffered a syncopal episode 2 days prior while putting away groceries in the kitchen.  She was holding her cane in her right hand and suddenly fell forward, and believes that she struck her right orbit on the handle of the cane.  She hit her left upper arm and shoulder on a telephone.  Episode was heard by her husband who came in and found the patient awake on the floor.  No loss of bowel or bladder function.  She did not seek medical care at that time.  She has been without further episodes of dizziness, near-syncope,  or syncope since.  Since having her first syncopal episode earlier this month she has refrained from driving.  She has placed a thumb brace on her right thumb due to discomfort.  She did suffer a skin tear along the left upper arm.  She is uncertain what her blood pressure  readings have been at home as she does not think her wrist BP cuff is accurate.   Labs independently reviewed: 11/2021 - Hgb 14.2, PLT 279, potassium 4.2, BUN 15, serum creatinine 1.06, albumin 3.9, AST/ALT normal 09/2021 - TC 136, TG 181, HDL 46, LDL 54, A1c 6.0 03/2020 - TSH normal  Past Medical History:  Diagnosis Date   Anxiety    CAD (coronary artery disease)    a. 11/2015 MV: mod mid-distal ant ischemia, EF 58% w/ apical HK;  b. 11/2015 Cath: LM nl, LAD 80/169m(fills via collats from RPDA & D1), D1 60ost, LCX small, nl, RCA 50p/m, RPDA nl, RPAV min irregs, RPl1/2/3 min irregs, EF 50-55%.   Cancer of brain ([facility] 03/2017   Dental bridge present    bilateral top.  Permanent retainer - bottom.   Dysuria    History of kidney stones    Hyperlipemia    Hypertension    Hypertensive heart disease    IBS (irritable bowel syndrome)    Ischemic cardiomyopathy    a. 11/2015 V gram: EF 50-55%; b. 09/2020 Echo: EF 35-40%, ant, antsept, apical HK. Gr1 DD. Nl RV fxn. Mild MR.   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 Still smoking ~ 1 cigarette/day.   Urethral caruncle    Vertigo    Vulvar adhesions 10/06/2013   perianal skin bridge noted at posterior fourchette and region of WLE surgical site   Vulvar leukoplakia    Vulvar pain     Past Surgical History:  Procedure Laterality Date   ABDOMINAL HERNIA REPAIR  2014   Dr. WRochel Brome  ABDOMINAL HYSTERECTOMY  1978   APPENDECTOMY     BRAIN SURGERY     CARDIAC CATHETERIZATION Left 12/07/2015   Procedure: Left Heart Cath and Coronary Angiography;  Surgeon: MWellington Hampshire MD;  [facility];  Service: Cardiovascular;  Laterality: Left;   CATARACT EXTRACTION W/PHACO Right 05/28/2018   Procedure:  CATARACT EXTRACTION PHACO AND INTRAOCULAR LENS PLACEMENT (ICuster RIGHT;  Surgeon: HMarchia Meiers MD;  Location: [facility];  Service: Ophthalmology;  Laterality: Right;  [place]00:[id number] CDE 7.17 Fluid pack Lot # [id number]   CATARACT EXTRACTION W/PHACO Left 01/06/2019   Procedure: CATARACT EXTRACTION PHACO AND INTRAOCULAR LENS PLACEMENT (IOC) LEFT  00:50.3  19.3%  9.71;  Surgeon: BLeandrew Koyanagi MD;  [facility]  Service: Ophthalmology;  Laterality: Left;   CERVICAL BIOPSY     Dr. DEnzo Bi  CHOLECYSTECTOMY     COLONOSCOPY WITH PROPOFOL N/A 02/09/2019   Procedure: COLONOSCOPY WITH PROPOFOL;  Surgeon: SLollie Sails MD;  [facility];  Service: Endoscopy;  Laterality: N/A;   ESOPHAGOGASTRODUODENOSCOPY (EGD) WITH PROPOFOL N/A 02/09/2019   Procedure: ESOPHAGOGASTRODUODENOSCOPY (EGD) WITH PROPOFOL;  Surgeon: SLollie Sails MD;  [facility];  Service: Endoscopy;  Laterality: N/A;   MOHS SURGERY Right    Leg  RIGHT/LEFT HEART CATH AND CORONARY ANGIOGRAPHY N/A 10/23/2020   Procedure: RIGHT/LEFT HEART CATH AND CORONARY ANGIOGRAPHY;  Surgeon: Wellington Hampshire, MD;  Location: [place] [facility];  Service: Cardiovascular;  Laterality: N/A;   SKIN BIOPSY     wide local excision  07/2013   vin 3 w/ margins involoved    Current Medications: Current Meds  Medication Sig   ALPRAZolam (XANAX) 0.25 MG tablet Take by mouth.   atorvastatin (LIPITOR) 80 MG tablet TAKE 1 TABLET(80 MG) BY MOUTH DAILY   Cyanocobalamin (VITAMIN B-12) 5000 MCG SUBL Place 5,000 mcg under the tongue daily as needed (low energy (feeling poorly)).   diphenhydrAMINE (BENADRYL) 25 MG tablet Take 25 mg by mouth at bedtime.   empagliflozin (JARDIANCE) 10 MG TABS tablet Take 1 tablet (10 mg total) by mouth daily before breakfast.   FLUoxetine (PROZAC) 20 MG capsule Take 20 mg by mouth daily.   hyoscyamine (LEVSIN SL) 0.125 MG SL tablet Place 0.125 mg under the tongue every 6 (six) hours as  needed. Place 1 tablet under the tongue every 6 hours as needed for cramping for up to 10 days   losartan (COZAAR) 25 MG tablet Take 1 tablet (25 mg total) by mouth daily.   metoprolol succinate (TOPROL XL) 25 MG 24 hr tablet Take 0.5 tablets (12.5 mg total) by mouth daily.   NON FORMULARY Yeast guard 1 qhs   NONFORMULARY OR COMPOUNDED ITEM Apply 1 application  topically 2 (two) times daily as needed (cancer spots). FLUOROURACIL 5% + CALCIPOTRIENE 0.005%   OVER THE COUNTER MEDICATION BEET Capules on occasion   pantoprazole (PROTONIX) 40 MG tablet Take 40 mg by mouth 2 (two) times daily.   prednisoLONE acetate (PRED FORTE) 1 % ophthalmic suspension Right eye    Allergies:   Tape, Codeine, Demerol [meperidine], Erythromycin, Iodinated contrast media, Other, Penicillins, and Entresto [sacubitril-valsartan]   Social History   Socioeconomic History   Marital status: Married    Spouse name: Geneticist, molecular   Number of children: 2   Years of education: Not on file   Highest education level: Not on file  Occupational History   Occupation: retired  Tobacco Use   Smoking status: Some Days    Packs/day: 0.25    Years: 55.00    Total pack years: 13.75    Types: Cigarettes    Last attempt to quit: 12/07/2015    Years since quitting: 6.0   Smokeless tobacco: Never   Tobacco comments:    smokes about 2 cigarettes a day  Vaping Use   Vaping Use: Never used  Substance and Sexual Activity   Alcohol use: Yes    Alcohol/week: 1.0 standard drink of alcohol    Types: 1 Cans of beer per week    Comment: 8 oz Bud Light with supper daily   Drug use: No   Sexual activity: Not Currently    Birth control/protection: Surgical  Other Topics Concern   Not on file  Social History Narrative   Lives in [place]. Previously worked [employer]. Husband with dementia. Children 2 sons.   Social Determinants of Health   Financial Resource Strain: Not on file  Food Insecurity: Not on file  Transportation Needs: Not  on file  Physical Activity: Not on file  Stress: Not on file  Social Connections: Not on file     Family History:  The patient's family history includes Bone cancer in her father; Cancer in her father; Congestive Heart Failure in her mother; Dementia in her  sister; Hyperlipidemia in her sister; Hypertension in her sister; Macular degeneration in her sister. There is no history of Diabetes or Heart disease.  ROS:   12-point review of systems is negative unless otherwise noted in the HPI.   EKGs/Labs/Other Studies Reviewed:    Studies reviewed were summarized above. The additional studies were reviewed today:  R/LHC 10/23/2020: Ost 1st Diag to 1st Diag lesion is 60% stenosed. Prox RCA to Mid RCA lesion is 50% stenosed. Mid LAD-1 lesion is 80% stenosed. Mid LAD-2 lesion is 100% stenosed. 1st Diag lesion is 60% stenosed.   1.  No significant change since 2017.  Chronically occluded mid LAD with collaterals from a large first diagonal as well as right coronary artery.  The first diagonal has moderate proximal stenosis which has been stable.  RCA has diffuse 50% disease which also has been stable. 2.  Left ventricular angiography was not performed.  EF was moderately reduced by echo. 3.  Right heart catheterization showed normal right and left-sided filling pressures, minimal pulmonary hypertension and severely reduced cardiac output.   Recommendations: No indication for revascularization.  Recommend optimizing heart failure medications. We will plan on switching the patient to [facility], adding spironolactone and likely adding an SGLT2 inhibitor. __________   2D echo 09/28/2020: 1. Left ventricular ejection fraction, by estimation, is 35 to 40%. The  left ventricle has moderately decreased function. The left ventricle  demonstrates regional wall motion abnormalities (anterior, anteroseptal,  apical hypokinesis). Left ventricular  diastolic parameters are consistent with Grade I  diastolic dysfunction  (impaired relaxation).   2. Right ventricular systolic function is normal. The right ventricular  size is normal. Tricuspid regurgitation signal is inadequate for assessing  PA pressure.   3. The mitral valve is normal in structure. Mild mitral valve  regurgitation. __________   Carotid artery ultrasound 04/19/2019: Summary:  Right Carotid: There is no evidence of stenosis in the right ICA.   Left Carotid: There is no evidence of stenosis in the left ICA.   Vertebrals:  Bilateral vertebral arteries demonstrate antegrade flow.  Subclavians: Normal flow hemodynamics were seen in bilateral subclavian arteries. __________   LHC 12/07/2015: The left ventricular systolic function is normal. LV end diastolic pressure is normal. The left ventricular ejection fraction is 50-55% by visual estimate. There is no mitral valve regurgitation. There is no aortic valve stenosis. Prox RCA to Mid RCA lesion, 50 %stenosed. Mid LAD lesion, 100 %stenosed. Ost 1st Diag to 1st Diag lesion, 60 %stenosed. Prox LAD to Mid LAD lesion, 80 %stenosed.   1.severe one-vessel coronary artery disease with chronically occluded mid LAD with faint right to left collaterals and left to left collaterals from the diagonal branch. Otherwise there is moderate disease affecting the mid RCA and ostial first diagonal which is large in size. 2. Low normal LV systolic function with an ejection fraction of 50-55% with mild anterior wall hypokinesis.moderately elevated filling left ventricular end-diastolic pressure but blood pressure was also moderately elevated at that time.   Recommendations: Continue aggressive medical therapy. Recommend a target LDL of less than 70. The LAD itself appears to be diffusely diseased in the proximal and midsegment. I doubt that she would get much benefit from LAD CTO PCI. __________   Lexiscan MPI 11/30/2015:There was no ST segment deviation noted during stress. No T wave  inversion was noted during stress.   Pharmacological myocardial perfusion imaging study with small region of fixed defect in the apical region, With small to moderate sized region of  moderate ischemia noted in the mid to distal anterior wall  Hypokinesis in the apical region EF estimated at 58% No EKG changes concerning for ischemia at peak stress or in recovery. Baseline EKG with T wave abnormality through the precordial leads consistent with ischemia Moderate risk scan   EKG:  EKG is ordered today.  The EKG ordered today demonstrates NSR, 71 bpm, early repolarization abnormalities with nonspecific lateral ST-T changes consistent with prior tracing  Recent Labs: 12/04/2021: ALT 19; BUN 15; Creatinine, Ser 1.06; Hemoglobin 14.2; Platelets 279; Potassium 4.2; Sodium 138  Recent Lipid Panel    Component Value Date/Time   CHOL 149 02/06/2016 0953   TRIG 155 (H) 02/06/2016 0953   HDL 54 02/06/2016 0953   CHOLHDL 2.8 02/06/2016 0953   CHOLHDL 4 05/17/2015 1442   VLDL 57.6 (H) 05/17/2015 1442   LDLCALC 64 02/06/2016 0953   LDLDIRECT 88.0 05/17/2015 1442    PHYSICAL EXAM:    VS:  BP 126/81 (BP Location: Left Arm, Patient Position: Sitting, Cuff Size: Normal)   Pulse 71   Ht '5\' 2"'$  (1.575 m)   Wt 152 lb 6 oz (69.1 kg)   SpO2 98%   BMI 27.87 kg/m   BMI: Body mass index is 27.87 kg/m.  Physical Exam Constitutional:      Appearance: She is well-developed.  HENT:     Head: Normocephalic.   Eyes:     General:        Right eye: No discharge.        Left eye: No discharge.  Neck:     Vascular: No JVD.  Cardiovascular:     Rate and Rhythm: Normal rate and regular rhythm.     Heart sounds: Normal heart sounds, S1 normal and S2 normal. Heart sounds not distant. No midsystolic click and no opening snap. No murmur heard.    No friction rub.  Pulmonary:     Effort: Pulmonary effort is normal. No respiratory distress.     Breath sounds: Normal breath sounds. No decreased breath  sounds, wheezing or rales.  Chest:     Chest wall: No tenderness.  Abdominal:     General: There is no distension.  Musculoskeletal:       Arms:     Cervical back: Normal range of motion.     Right lower leg: No edema.     Left lower leg: No edema.  Skin:    General: Skin is warm and dry.     Nails: There is no clubbing.  Neurological:     Mental Status: She is alert and oriented to person, place, and time.  Psychiatric:        Speech: Speech normal.        Behavior: Behavior normal.        Thought Content: Thought content normal.        Judgment: Judgment normal.     Wt Readings from Last 3 Encounters:  01/03/22 152 lb 6 oz (69.1 kg)  12/11/21 153 lb (69.4 kg)  11/06/21 153 lb (69.4 kg)     Orthostatic vital signs: Lying: 123/79, 76 bpm Sitting: 117/77, 79 bpm, dizzy Standing: 111/75, 90 bpm, dizzy Standing x3 minutes: 115/75, 91 bpm, dizzy  ASSESSMENT & PLAN:   Dizziness with near syncope and syncope with associated right orbital contusion/laceration, left shoulder and upper extremity skin tear and contusion, and right first and second digit contusion: Historically, episodes of dizziness and syncope have been felt to be vertiginous in etiology.  However, her more recent episodes this month are not consistent with this and with sudden onset or concerning for cardiac arrhythmia.  Place a ZIO AT.  Schedule echo and carotid artery ultrasound.  She was advised to refrain from driving for 6 months until most recent syncopal episode or until an identifiable cause is adequately treated.  She is currently not driving on her own accord.  In an effort to allow for more permissive blood pressure we will hold Jardiance and losartan today.  With regards to her numerous injuries, she declines a CT head and plain film of the upper extremities.  She was made aware of the risks of this decision.  CAD involving native coronaries without angina: She is without symptoms of angina or  decompensation.  No symptoms of angina in the perisyncopal timeframe.  On most recent CAD she was documented to have a chronically occluded LAD with collaterals and moderate diagonal and RCA disease.  Continue aggressive risk factor modification and current medical therapy.  Based on the above work-up, we may need to consider Lexiscan MPI to evaluate for high risk ischemia.  HFrEF secondary to ICM: She appears euvolemic and well compensated.  Continue low-dose Toprol-XL 12.5 mg daily.  We will hold Jardiance and losartan for now in an effort to allow for more permissive blood pressure with noted positional dizziness.  HTN: Blood pressure is well controlled in the office today.  Medical therapy as outlined above.  HLD: LDL 54.  She remains on atorvastatin.  Tobacco use: Complete cessation is encouraged.  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  Prior documentation indicates she has a dominant ulnar artery that was previously noted to be widely patent.  This does not require further management, though does preclude future catheterization via the right radial artery.   Disposition: F/u with Dr. Fletcher Anon or an APP in 2 months.   Medication Adjustments/Labs and Tests Ordered: Current medicines are reviewed at length with the patient today.  Concerns regarding medicines are outlined above. Medication changes, Labs and Tests ordered today are summarized above and listed in the Patient Instructions accessible in Encounters.   Signed, Christell Faith, PA-C 01/03/2022 4:52 PM     [facility] [address] [place] [address] [place], [place] [address] [phone]

## 2022-01-03 NOTE — Patient Instructions (Signed)
Medication Instructions:  Your physician has recommended you make the following change in your medication:   STOP Jardiance STOP Losartan  *If you need a refill on your cardiac medications before your next appointment, please call your pharmacy*   Lab Work: None  If you have labs (blood work) drawn today and your tests are completely normal, you will receive your results only by: [place] (if you have MyChart) OR A paper copy in the mail If you have any lab test that is abnormal or we need to change your treatment, we will call you to review the results.   Testing/Procedures: Your physician has requested that you have an echocardiogram. Echocardiography is a painless test that uses sound waves to create images of your heart. It provides your doctor with information about the size and shape of your heart and how well your heart's chambers and valves are working. This procedure takes approximately one hour. There are no restrictions for this procedure.  Your physician has requested that you have a carotid duplex. This test is an ultrasound of the carotid arteries in your neck. It looks at blood flow through these arteries that supply the brain with blood. Allow one hour for this exam. There are no restrictions or special instructions.  Your physician has recommended that you wear a Zio AT Live monitor.   This monitor is a medical device that records the heart's electrical activity. Doctors most often use these monitors to diagnose arrhythmias. Arrhythmias are problems with the speed or rhythm of the heartbeat. The monitor is a small device applied to your chest. You can wear one while you do your normal daily activities. While wearing this monitor if you have any symptoms to push the button and record what you felt. Once you have worn this monitor for the period of time provider prescribed (Usually 14 days), you will return the monitor device in the postage paid box/bag. Once it is  returned they will download the data collected and provide [place] with a report which the provider will then review and we will call you with those results.   Important tips:  Avoid showering during the first 24 hours of wearing the monitor. Avoid excessive sweating to help maximize wear time. Do not submerge the device, no hot tubs, and no swimming pools. Keep any lotions or oils away from the patch. After 24 hours you may shower with the patch on. Take brief showers with your back facing the shower head.  Do not remove patch once it has been placed because that will interrupt data and decrease adhesive wear time. Push the button when you have any symptoms and write down what you were feeling. Once you have completed wearing your monitor, remove and place into box which has postage paid and place in your outgoing mailbox.  If for some reason you have misplaced your box then call our office and we can provide another box and/or mail it off for you. Keep the transmitter within 10 feet at all times.  Expect a welcome phone call within 40-98 hrs of application from [place].  This call will include your copay information, so please answer any unknown phone calls while wearing Zio (it could also be important information about your heart) The envelope to return Zio is in the back of the transmitter. Removal instructions are on the last page of the symptom diary.  Place the patch sticky side up inside the transmitter and the symptom diary inside the envelope to return  on your last wear day inside your mailbox or any [employer].   Follow-Up: At [facility], you and your health needs are our priority.  As part of our continuing mission to provide you with exceptional heart care, we have created designated Provider Care Teams.  These Care Teams include your primary Cardiologist (physician) and Advanced Practice Providers (APPs -  Physician Assistants and Nurse Practitioners) who all work together to provide  you with the care you need, when you need it.    Your next appointment:   2 month(s)  The format for your next appointment:   In Person  Provider:   Kathlyn Sacramento, MD or Christell Faith, PA-C       Important Information About [place]

## 2022-01-06 DIAGNOSIS — R55 Syncope and collapse: Secondary | ICD-10-CM

## 2022-01-09 ENCOUNTER — Ambulatory Visit: Payer: PPO

## 2022-01-09 DIAGNOSIS — Z09 Encounter for follow-up examination after completed treatment for conditions other than malignant neoplasm: Secondary | ICD-10-CM | POA: Diagnosis not present

## 2022-01-09 DIAGNOSIS — Z8679 Personal history of other diseases of the circulatory system: Secondary | ICD-10-CM | POA: Diagnosis not present

## 2022-01-10 DIAGNOSIS — M5416 Radiculopathy, lumbar region: Secondary | ICD-10-CM | POA: Diagnosis not present

## 2022-01-10 DIAGNOSIS — M25551 Pain in right hip: Secondary | ICD-10-CM | POA: Diagnosis not present

## 2022-01-10 DIAGNOSIS — M48062 Spinal stenosis, lumbar region with neurogenic claudication: Secondary | ICD-10-CM | POA: Diagnosis not present

## 2022-01-10 DIAGNOSIS — M7062 Trochanteric bursitis, left hip: Secondary | ICD-10-CM | POA: Diagnosis not present

## 2022-01-10 DIAGNOSIS — M5136 Other intervertebral disc degeneration, lumbar region: Secondary | ICD-10-CM | POA: Diagnosis not present

## 2022-01-10 DIAGNOSIS — M25552 Pain in left hip: Secondary | ICD-10-CM | POA: Diagnosis not present

## 2022-01-10 DIAGNOSIS — M7061 Trochanteric bursitis, right hip: Secondary | ICD-10-CM | POA: Diagnosis not present

## 2022-01-12 DIAGNOSIS — R55 Syncope and collapse: Secondary | ICD-10-CM | POA: Diagnosis not present

## 2022-01-18 ENCOUNTER — Telehealth: Payer: Self-pay | Admitting: Cardiovascular Disease

## 2022-01-18 DIAGNOSIS — I4892 Unspecified atrial flutter: Secondary | ICD-10-CM

## 2022-01-18 MED ORDER — METOPROLOL SUCCINATE ER 25 MG PO TB24: 25.0000 mg | ORAL_TABLET | Freq: Every day | ORAL | 1 refills | Status: DC

## 2022-01-18 MED ORDER — APIXABAN 5 MG PO TABS: 5.0000 mg | ORAL_TABLET | Freq: Two times a day (BID) | ORAL | 1 refills | Status: DC

## 2022-01-18 NOTE — Telephone Encounter (Addendum)
Incoming stat triage call received from [facility] with [employer]       Cardiac Monitor Alert  Date of alert:  01/18/2022   Patient Name: Susan Mcmillan  DOB: 07-11-1945  MRN: [id number]   [place] Cardiologist: Kathlyn Sacramento, MD  [facility] EP:  None    Monitor Information: Long Term Monitor-Live Telemetry [ZioAT]  Reason:  Syncope Ordering provider:  Christell Faith, PA    Alert Atrial Fibrillation/Flutter This is the 1st alert for this rhythm.  The patient has no hx of Atrial Fibrillation/Flutter.  The patient is not currently on anticoagulation.  Next Cardiology Appointment    Date:  03/11/22  Provider:  Christell Faith, PA  The patient could NOT be reached by telephone today.  [employer] has called the patient twice and left a msg. Lorriane Shire sts that she will attempt to reach the patient again in the next 15 mins.       I Rhthym strips printed. Strips reviewed by Christell Faith, PA. Per Ryan's instructions.  -Patient previously declined a CT of the head after recent syncope and fall. Patient should have a non contrast CT of head.  -Patient should start Eliquis 5 mg bid. -Increase Metoprolol Succinate to 25 mg daily -Reduce Losartan to 12.5 mg daily  -Lab work in 1 week. Bmp, Cbc, Tsh, Mag  -Reschedule 03/11/22 appt with RD to a day next week.    Other: Documented Aflutter today at 11:28 am. HR range 114-151 bpm. Duration 90 secs. Avg HR 136 bpm. No patient trigger.  Lamar Laundry, RN  01/18/2022 3:25 PM

## 2022-01-18 NOTE — Telephone Encounter (Signed)
Spoke with the patient.  Patient is asymptomatic. She did also received a call from [employer]. Patient denies any reoccurrence of pre-syncope or syncope. She has not had any dizziness since she was last seen. Pt sts that she was instructed to stop Jardiance and hold Losartan.  Pt made aware of Christell Faith, PA recommendation:  Reiterated to the patient the need for a CT of the head. Patient still declines a CT of the head at this time Increase Metoprolol to 25 mg daily Decrease Losartan to 12.5 mg daily (Pt sts that she is currently holding this med) She is agreeable with starting Eliquis 5 mg bid, she is concerned about the out of pocket cost since she is currently in the donut hole. Adv the patient that we can provide a free 30 day card, samples, and a patient assistance application. Patients husband will come pick them up now. Adv the patient to bring the completed PAF and required documentation with her to her appt on 9/12. Adv the patient that she will need labwork in a week bmp, cbc, tsh, mag.  Patients appt with Thurmond Butts moved up to 01/22/22 @ 2:45 pm.  iRhthym strips placed in scan basket   Patient verbalized understanding to the instruction given and voiced appreciation for the call.

## 2022-01-18 NOTE — Telephone Encounter (Signed)
Calling with abnormal ekg

## 2022-01-21 MED ORDER — APIXABAN 5 MG PO TABS: 5.0000 mg | ORAL_TABLET | Freq: Two times a day (BID) | ORAL | 3 refills | Status: DC

## 2022-01-21 NOTE — Telephone Encounter (Signed)
Patient has appointment tomorrow and called to review necessary documents needed in order to process application. She verbalized understanding and confirmed appointment. She had no further questions at this time.

## 2022-01-21 NOTE — Progress Notes (Unsigned)
Cardiology Office Note    Date:  01/22/2022   ID:  Susan Mcmillan, DOB May 11, 1946, MRN [id number]  PCP:  Marinda Elk, MD  Cardiologist:  Kathlyn Sacramento, MD  Electrophysiologist:  None   Chief Complaint: Follow-up  History of Present Illness:   Susan Mcmillan is a 76 y.o. 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   She was found to have significant coronary artery calcification on CT imaging in 2017.  Nuclear stress test showed moderate mid to distal anterior wall ischemia with normal EF.  LHC showed an occluded mid LAD with right to left collaterals and left to left collaterals.  She otherwise had moderate RCA disease.  She was medically managed.  Carotid artery ultrasound in 04/2019 showed no significant carotid artery disease.  She had worsening heart failure earlier in 2022 with repeat echo in 09/2020 demonstrating an EF of 35 to 17%, grade 1 diastolic dysfunction, and mild mitral regurgitation.  Subsequent R/LHC in 10/2020 showed no significant change when compared to study in 2017 with chronically occluded mid LAD with collaterals as well as moderate proximal stenosis in the first D1 and moderate RCA disease.  RHC showed normal right and left-sided filling pressures, minimal pulmonary hypertension, and severely reduced cardiac output.  Titration of GDMT has been limited by low blood pressure as well as hypotension/rash associated with Entresto.  She was seen in the office in 04/2021 and reported no angina or symptoms concerning for decompensation.  Her biggest issue at that time continued to be dizziness and poor balance with a spinning sensation suspected to be likely vertigo.  She has subsequently dealt with recurrent vaginal  infections/UTIs with recommendation for her to stop Jardiance to decrease the risk of recurrent infections.  However, she preferred to continue Jardiance along with a self recommended homeopathic medication.  She was last seen in the office in 10/2021 and remained without symptoms of angina or decompensation.  She had discontinued metoprolol secondary to positional dizziness with noted improvement in symptoms thereafter.  She was without symptoms of presyncope or syncope.  She remains on Jardiance and losartan.   She contacted our office on 01/02/2022 noting multiple episodes of dizziness with near syncope, along with generalized weakness and falls.  Telephone note indicates a systolic blood pressure of 96 mmHg.  Given this, she was evaluated on 01/03/2022 and noted multiple episodes of dizziness with near syncope and 2-3 episodes of frank syncope that were sudden in onset and without associated symptoms.  No postictal period.  She declined head CT.  Zio patch, echo, and carotid artery ultrasound were recommended.  We received a call from [employer] earlier this month that her the patient's Zio patch showed new onset atrial flutter.  Given this, she was initiated on apixaban with repeat recommendation for head CT, which she declined.  Metoprolol was titrated to 25 mg daily.  Labs remain pending at this time.  She comes in doing well from a cardiac perspective and is without symptoms of angina or decompensation.  No further syncopal episodes.  No dizziness or near syncope.  She did have 1 episode of tachypalpitations, possibly coinciding with the documented atrial flutter as outlined above.  She has tolerated the titration of metoprolol and addition of apixaban without  issues.  No falls or symptoms concerning for bleeding.  Blood pressure remains stable.  She is more sedentary now as she is fearful of further syncopal episodes.  She has declined head CT.  She continues to wear her Zio patch.   Labs independently  reviewed: 11/2021 - Hgb 14.2, PLT 279, potassium 4.2, BUN 15, serum creatinine 1.06, albumin 3.9, AST/ALT normal 09/2021 - TC 136, TG 181, HDL 46, LDL 54, A1c 6.0 03/2020 - TSH normal  Past Medical History:  Diagnosis Date   Anxiety    CAD (coronary artery disease)    a. 11/2015 MV: mod mid-distal ant ischemia, EF 58% w/ apical HK;  b. 11/2015 Cath: LM nl, LAD 80/131m(fills via collats from RPDA & D1), D1 60ost, LCX small, nl, RCA 50p/m, RPDA nl, RPAV min irregs, RPl1/2/3 min irregs, EF 50-55%.   [facility] brain ([facility] 03/2017   Dental bridge present    bilateral top.  Permanent retainer - bottom.   Dysuria    History of kidney stones    Hyperlipemia    Hypertension    Hypertensive heart disease    IBS (irritable bowel syndrome)    Ischemic cardiomyopathy    a. 11/2015 V gram: EF 50-55%; b. 09/2020 Echo: EF 35-40%, ant, antsept, apical HK. Gr1 DD. Nl RV fxn. Mild MR.   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 Still smoking ~ 1 cigarette/day.   Urethral caruncle    Vertigo    Vulvar adhesions 10/06/2013   perianal skin bridge noted at posterior fourchette and region of WLE surgical site   Vulvar leukoplakia    Vulvar pain     Past Surgical History:  Procedure Laterality Date   ABDOMINAL HERNIA REPAIR  2014   Dr. WRochel Brome  ABDOMINAL HYSTERECTOMY  1978   APPENDECTOMY     BRAIN SURGERY     CARDIAC CATHETERIZATION Left 12/07/2015   Procedure: Left Heart Cath and Coronary Angiography;  Surgeon: MWellington Hampshire MD;  [facility];  Service: Cardiovascular;  Laterality: Left;   CATARACT EXTRACTION W/PHACO Right 05/28/2018   Procedure: CATARACT EXTRACTION PHACO AND INTRAOCULAR LENS PLACEMENT ([place];  Surgeon: HMarchia Meiers MD;   [facility];  Service: Ophthalmology;  Laterality: Right;  [id number]:[id number] CDE 7.17 Fluid pack Lot # [id number]   CATARACT EXTRACTION W/PHACO Left 01/06/2019   Procedure: CATARACT EXTRACTION PHACO AND INTRAOCULAR LENS PLACEMENT (IOC) LEFT  00:50.3  19.3%  9.71;  Surgeon: BLeandrew Koyanagi MD;  [place]  Service: Ophthalmology;  Laterality: Left;   CERVICAL BIOPSY     Dr. DEnzo Bi  CHOLECYSTECTOMY     COLONOSCOPY WITH PROPOFOL N/A 02/09/2019   Procedure: COLONOSCOPY WITH PROPOFOL;  Surgeon: SLollie Sails MD;  [facility];  Service: Endoscopy;  Laterality: N/A;   ESOPHAGOGASTRODUODENOSCOPY (EGD) WITH PROPOFOL N/A 02/09/2019   Procedure: ESOPHAGOGASTRODUODENOSCOPY (EGD) WITH PROPOFOL;  Surgeon: SLollie Sails MD;  [facility];  Service: Endoscopy;  Laterality: N/A;   MOHS SURGERY Right    Leg   RIGHT/LEFT HEART CATH AND CORONARY ANGIOGRAPHY N/A 10/23/2020   Procedure: RIGHT/LEFT HEART CATH AND CORONARY ANGIOGRAPHY;  Surgeon: AWellington Hampshire MD;  Location:  [facility] [facility];  Service: Cardiovascular;  Laterality: N/A;   SKIN BIOPSY     wide local excision  07/2013   vin 3 w/ margins involoved    Current Medications: Current Meds  Medication Sig   ALPRAZolam (XANAX) 0.25 MG tablet Take by mouth.   apixaban (ELIQUIS) 5 MG TABS tablet Take 1 tablet (5 mg total) by mouth 2 (two) times daily.   atorvastatin (LIPITOR) 80 MG tablet TAKE 1 TABLET(80 MG) BY MOUTH DAILY   Cyanocobalamin (VITAMIN B-12) 5000 MCG SUBL Place 5,000 mcg under the tongue daily as needed (low energy (feeling poorly)).   diphenhydrAMINE (BENADRYL) 25 MG tablet Take 25 mg by mouth at bedtime.   FLUoxetine (PROZAC) 20 MG capsule Take 20 mg by mouth daily.   metoprolol succinate (TOPROL XL) 25 MG 24 hr tablet Take 1 tablet (25 mg total) by mouth daily.   NON FORMULARY Yeast guard 1 qhs   NONFORMULARY OR COMPOUNDED ITEM Apply 1 application  topically 2 (two)  times daily as needed (cancer spots). FLUOROURACIL 5% + CALCIPOTRIENE 0.005%   OVER THE COUNTER MEDICATION BEET Capules on occasion   pantoprazole (PROTONIX) 40 MG tablet Take 40 mg by mouth 2 (two) times daily.   prednisoLONE acetate (PRED FORTE) 1 % ophthalmic suspension Right eye    Allergies:   Tape, Codeine, Demerol [meperidine], Erythromycin, Iodinated contrast media, Other, Penicillins, and Entresto [sacubitril-valsartan]   Social History   Socioeconomic History   Marital status: Married    Spouse name: Geneticist, molecular   Number of children: 2   Years of education: Not on file   Highest education level: Not on file  Occupational History   Occupation: retired  Tobacco Use   Smoking status: Some Days    Packs/day: 0.25    Years: 55.00    Total pack years: 13.75    Types: Cigarettes    Last attempt to quit: 12/07/2015    Years since quitting: 6.1   Smokeless tobacco: Never   Tobacco comments:    smokes about 2 cigarettes a day  Vaping Use   Vaping Use: Never used  Substance and Sexual Activity   Alcohol use: Yes    Alcohol/week: 1.0 standard drink of alcohol    Types: 1 Cans of beer per week    Comment: 8 oz Bud Light with supper daily   Drug use: No   Sexual activity: Not Currently    Birth control/protection: Surgical  Other Topics Concern   Not on file  Social History Narrative   Lives in [place]. Previously worked [employer]. Husband with dementia. Children 2 sons.   Social Determinants of Health   Financial Resource Strain: Not on file  Food Insecurity: Not on file  Transportation Needs: Not on file  Physical Activity: Not on file  Stress: Not on file  Social Connections: Not on file     Family History:  The patient's family history includes Bone cancer in her father; Cancer in her father; Congestive Heart Failure in her mother; Dementia in her sister; Hyperlipidemia in her sister; Hypertension in her sister; Macular degeneration in her sister. There is no history  of Diabetes or Heart disease.  ROS:   12-point review of systems is negative unless otherwise noted in the HPI.   EKGs/Labs/Other Studies Reviewed:    Studies reviewed were summarized above. The additional studies were reviewed today:  R/LHC 10/23/2020: Ost 1st Diag to 1st Diag lesion is 60% stenosed. Prox RCA to Mid RCA lesion is 50%  stenosed. Mid LAD-1 lesion is 80% stenosed. Mid LAD-2 lesion is 100% stenosed. 1st Diag lesion is 60% stenosed.   1.  No significant change since 2017.  Chronically occluded mid LAD with collaterals from a large first diagonal as well as right coronary artery.  The first diagonal has moderate proximal stenosis which has been stable.  RCA has diffuse 50% disease which also has been stable. 2.  Left ventricular angiography was not performed.  EF was moderately reduced by echo. 3.  Right heart catheterization showed normal right and left-sided filling pressures, minimal pulmonary hypertension and severely reduced cardiac output.   Recommendations: No indication for revascularization.  Recommend optimizing heart failure medications. We will plan on switching the patient to [facility], adding spironolactone and likely adding an SGLT2 inhibitor. __________   2D echo 09/28/2020: 1. Left ventricular ejection fraction, by estimation, is 35 to 40%. The  left ventricle has moderately decreased function. The left ventricle  demonstrates regional wall motion abnormalities (anterior, anteroseptal,  apical hypokinesis). Left ventricular  diastolic parameters are consistent with Grade I diastolic dysfunction  (impaired relaxation).   2. Right ventricular systolic function is normal. The right ventricular  size is normal. Tricuspid regurgitation signal is inadequate for assessing  PA pressure.   3. The mitral valve is normal in structure. Mild mitral valve  regurgitation. __________   Carotid artery ultrasound 04/19/2019: Summary:  Right Carotid: There is no  evidence of stenosis in the right ICA.   Left Carotid: There is no evidence of stenosis in the left ICA.   Vertebrals:  Bilateral vertebral arteries demonstrate antegrade flow.  Subclavians: Normal flow hemodynamics were seen in bilateral subclavian arteries. __________   LHC 12/07/2015: The left ventricular systolic function is normal. LV end diastolic pressure is normal. The left ventricular ejection fraction is 50-55% by visual estimate. There is no mitral valve regurgitation. There is no aortic valve stenosis. Prox RCA to Mid RCA lesion, 50 %stenosed. Mid LAD lesion, 100 %stenosed. Ost 1st Diag to 1st Diag lesion, 60 %stenosed. Prox LAD to Mid LAD lesion, 80 %stenosed.   1.severe one-vessel coronary artery disease with chronically occluded mid LAD with faint right to left collaterals and left to left collaterals from the diagonal branch. Otherwise there is moderate disease affecting the mid RCA and ostial first diagonal which is large in size. 2. Low normal LV systolic function with an ejection fraction of 50-55% with mild anterior wall hypokinesis.moderately elevated filling left ventricular end-diastolic pressure but blood pressure was also moderately elevated at that time.   Recommendations: Continue aggressive medical therapy. Recommend a target LDL of less than 70. The LAD itself appears to be diffusely diseased in the proximal and midsegment. I doubt that she would get much benefit from LAD CTO PCI. __________   Lexiscan MPI 11/30/2015:There was no ST segment deviation noted during stress. No T wave inversion was noted during stress.   Pharmacological myocardial perfusion imaging study with small region of fixed defect in the apical region, With small to moderate sized region of moderate ischemia noted in the mid to distal anterior wall  Hypokinesis in the apical region EF estimated at 58% No EKG changes concerning for ischemia at peak stress or in recovery. Baseline EKG with  T wave abnormality through the precordial leads consistent with ischemia Moderate risk scan   EKG:  EKG is ordered today.  The EKG ordered today demonstrates NSR, 67 bpm, nonspecific ST-T changes, consistent with prior tracing  Recent Labs: 12/04/2021: ALT 19; BUN  15; Creatinine, Ser 1.06; Hemoglobin 14.2; Platelets 279; Potassium 4.2; Sodium 138  Recent Lipid Panel    Component Value Date/Time   CHOL 149 02/06/2016 0953   TRIG 155 (H) 02/06/2016 0953   HDL 54 02/06/2016 0953   CHOLHDL 2.8 02/06/2016 0953   CHOLHDL 4 05/17/2015 1442   VLDL 57.6 (H) 05/17/2015 1442   LDLCALC 64 02/06/2016 0953   LDLDIRECT 88.0 05/17/2015 1442    PHYSICAL EXAM:    VS:  BP 128/70 (BP Location: Left Arm, Patient Position: Sitting, Cuff Size: Normal)   Pulse 67   Ht '5\' 3"'$  (1.6 m)   Wt 156 lb 9.6 oz (71 kg)   SpO2 94%   BMI 27.74 kg/m   BMI: Body mass index is 27.74 kg/m.  Physical Exam Vitals reviewed.  Constitutional:      Appearance: She is well-developed.  HENT:     Head: Normocephalic and atraumatic.  Eyes:     General:        Right eye: No discharge.        Left eye: No discharge.  Neck:     Vascular: No JVD.  Cardiovascular:     Rate and Rhythm: Normal rate and regular rhythm.     Pulses:          Posterior tibial pulses are 2+ on the right side and 2+ on the left side.     Heart sounds: Normal heart sounds, S1 normal and S2 normal. Heart sounds not distant. No midsystolic click and no opening snap. No murmur heard.    No friction rub.  Pulmonary:     Effort: Pulmonary effort is normal. No respiratory distress.     Breath sounds: Normal breath sounds. No decreased breath sounds, wheezing or rales.  Chest:     Chest wall: No tenderness.  Abdominal:     General: There is no distension.  Musculoskeletal:     Cervical back: Normal range of motion.     Right lower leg: No edema.     Left lower leg: No edema.  Skin:    General: Skin is warm and dry.     Nails: There is no  clubbing.  Neurological:     Mental Status: She is alert and oriented to person, place, and time.  Psychiatric:        Speech: Speech normal.        Behavior: Behavior normal.        Thought Content: Thought content normal.        Judgment: Judgment normal.     Wt Readings from Last 3 Encounters:  01/22/22 156 lb 9.6 oz (71 kg)  01/03/22 152 lb 6 oz (69.1 kg)  12/11/21 153 lb (69.4 kg)     ASSESSMENT & PLAN:   New onset atrial flutter: Currently maintaining sinus rhythm.  It is unclear if this contributed to her dizziness with near syncope/syncope, possibly with a posttermination pause.  Continue ZIO AT to quantify atrial flutter burden.  Continue titrated dose of metoprolol.  CHA2DS2-VASc at least 6 (CHF, HTN, age x2, vascular disease, sex category).  Continue newly started apixaban 5 mg twice daily (she does not meet reduced dosing criteria).  Check CBC, BMP, TSH, and magnesium.  Echo is scheduled for later this month.  CAD involving native coronary arteries without angina: She is doing well without symptoms concerning for angina or decompensation.  Continue aggressive risk factor modification.  She is now on apixaban a place of aspirin to minimize  bleeding risk.  She will otherwise continue atorvastatin and metoprolol.  Dizziness with near syncope and syncope: Historically, episodes of dizziness and syncope have been felt to be vertiginous in etiology.  However, her more recent episodes are more concerning for cardiac arrhythmia.  Cannot exclude posttermination pause contributing to her syncopal episode given newly documented atrial flutter.  So far, no evidence of malignant ventricular arrhythmia on ZIO AT.  Await echo.  Continue adequate hydration and slow positional changes.  She has declined head CT.  Currently refraining from driving for 6 months until most recent syncopal episode.  HFrEF secondary to ICM: She appears euvolemic and well compensated.  Continue Toprol-XL 25 mg daily.   She is no longer on Jardiance or losartan given relative hypotension and positional dizziness.  Not requiring a standing diuretic.  Weight echo which is scheduled for later this month.  HTN: Blood pressure is stable and well-controlled on Toprol-XL 25 mg daily.  HLD: LDL 54.  She remains on atorvastatin.  Tobacco use: Complete cessation is encouraged.  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  Prior documentation indicates she has a dominant ulnar artery that was previously noted to be widely patent.  This does not require further management, though does preclude future catheterization via the right radial artery.   Disposition: F/u with Dr. Fletcher Anon or an APP in 2 months.   Medication Adjustments/Labs and Tests Ordered: Current medicines are reviewed at length with the patient today.  Concerns regarding medicines are outlined above. Medication changes, Labs and Tests ordered today are summarized above and listed in the Patient Instructions accessible in Encounters.   Signed, Christell Faith, PA-C 01/22/2022 4:34 PM     [address] [place], [place] [address] [phone]

## 2022-01-22 ENCOUNTER — Encounter: Payer: Self-pay | Admitting: Physician Assistant

## 2022-01-22 ENCOUNTER — Ambulatory Visit: Payer: PPO | Attending: Physician Assistant | Admitting: Physician Assistant

## 2022-01-22 ENCOUNTER — Other Ambulatory Visit
Admission: RE | Admit: 2022-01-22 | Discharge: 2022-01-22 | Disposition: A | Discharge status: Home / Self Care | Payer: PPO | Source: Ambulatory Visit | Attending: Physician Assistant | Admitting: Physician Assistant

## 2022-01-22 VITALS — BP 128/70 | HR 67 | Ht 63.0 in | Wt 156.6 lb

## 2022-01-22 DIAGNOSIS — I2510 Atherosclerotic heart disease of native coronary artery without angina pectoris: Secondary | ICD-10-CM

## 2022-01-22 DIAGNOSIS — R42 Dizziness and giddiness: Secondary | ICD-10-CM | POA: Diagnosis not present

## 2022-01-22 DIAGNOSIS — I10 Essential (primary) hypertension: Secondary | ICD-10-CM | POA: Diagnosis not present

## 2022-01-22 DIAGNOSIS — I70208 Unspecified atherosclerosis of native arteries of extremities, other extremity: Secondary | ICD-10-CM

## 2022-01-22 DIAGNOSIS — I255 Ischemic cardiomyopathy: Secondary | ICD-10-CM

## 2022-01-22 DIAGNOSIS — E785 Hyperlipidemia, unspecified: Secondary | ICD-10-CM

## 2022-01-22 DIAGNOSIS — I5022 Chronic systolic (congestive) heart failure: Secondary | ICD-10-CM | POA: Diagnosis not present

## 2022-01-22 DIAGNOSIS — I4892 Unspecified atrial flutter: Secondary | ICD-10-CM | POA: Diagnosis not present

## 2022-01-22 DIAGNOSIS — Z720 Tobacco use: Secondary | ICD-10-CM

## 2022-01-22 DIAGNOSIS — R55 Syncope and collapse: Secondary | ICD-10-CM | POA: Diagnosis not present

## 2022-01-22 LAB — BASIC METABOLIC PANEL
Anion gap: 10 (ref 5–15)
BUN: 14 mg/dL (ref 8–23)
CO2: 26 mmol/L (ref 22–32)
Calcium: 9.6 mg/dL (ref 8.9–10.3)
Chloride: 103 mmol/L (ref 98–111)
Creatinine, Ser: 0.73 mg/dL (ref 0.44–1.00)
GFR, Estimated: >60 mL/min (ref >60)
Glucose, Bld: 91 mg/dL (ref 70–99)
Potassium: 3.8 mmol/L (ref 3.5–5.1)
Sodium: 139 mmol/L (ref 135–145)

## 2022-01-22 LAB — CBC WITH DIFFERENTIAL/PLATELET
Abs Immature Granulocytes: 0.02 10*3/uL (ref 0.00–0.07)
Basophils Absolute: 0 10*3/uL (ref 0.0–0.1)
Basophils Relative: 1 %
Eosinophils Absolute: 0.2 10*3/uL (ref 0.0–0.5)
Eosinophils Relative: 2 %
HCT: 43.2 % (ref 36.0–46.0)
Hemoglobin: 14.2 g/dL (ref 12.0–15.0)
Immature Granulocytes: 0 %
Lymphocytes Relative: 19 %
Lymphs Abs: 1.4 10*3/uL (ref 0.7–4.0)
MCH: 28.9 pg (ref 26.0–34.0)
MCHC: 32.9 g/dL (ref 30.0–36.0)
MCV: 88 fL (ref 80.0–100.0)
Monocytes Absolute: 0.6 10*3/uL (ref 0.1–1.0)
Monocytes Relative: 8 %
Neutro Abs: 5.2 10*3/uL (ref 1.7–7.7)
Neutrophils Relative %: 70 %
Platelets: 339 10*3/uL (ref 150–400)
RBC: 4.91 MIL/uL (ref 3.87–5.11)
RDW: 15.1 % (ref 11.5–15.5)
WBC: 7.4 10*3/uL (ref 4.0–10.5)
nRBC: 0 % (ref 0.0–0.2)

## 2022-01-22 LAB — TSH: TSH: 1.839 u[IU]/mL (ref 0.350–4.500)

## 2022-01-22 LAB — MAGNESIUM: Magnesium: 2 mg/dL (ref 1.7–2.4)

## 2022-01-22 NOTE — Patient Instructions (Signed)
Medication Instructions:  Your physician recommends that you continue on your current medications as directed. Please refer to the Current Medication list given to you today.  *If you need a refill on your cardiac medications before your next appointment, please call your pharmacy*   Lab Work: CBC, BMP, TSH, and MAG today over at the [facility] entrance and stop at check-in.   If you have labs (blood work) drawn today and your tests are completely normal, you will receive your results only by: [place] (if you have MyChart) OR A paper copy in the mail If you have any lab test that is abnormal or we need to change your treatment, we will call you to review the results.   Testing/Procedures: None    Follow-Up: At [facility], you and your health needs are our priority.  As part of our continuing mission to provide you with exceptional heart care, we have created designated Provider Care Teams.  These Care Teams include your primary Cardiologist (physician) and Advanced Practice Providers (APPs -  Physician Assistants and Nurse Practitioners) who all work together to provide you with the care you need, when you need it.   Your next appointment:   2 month(s)  The format for your next appointment:   In Person  Provider:   Kathlyn Sacramento, MD or Christell Faith, PA-C        Important Information About Sugar

## 2022-01-25 NOTE — Telephone Encounter (Signed)
Patient assistance application completed, faxed, and filed in Samples closet.

## 2022-01-28 DIAGNOSIS — M25562 Pain in left knee: Secondary | ICD-10-CM | POA: Diagnosis not present

## 2022-01-28 DIAGNOSIS — R55 Syncope and collapse: Secondary | ICD-10-CM | POA: Diagnosis not present

## 2022-01-28 DIAGNOSIS — W19XXXD Unspecified fall, subsequent encounter: Secondary | ICD-10-CM | POA: Diagnosis not present

## 2022-01-30 ENCOUNTER — Telehealth: Payer: Self-pay | Admitting: *Deleted

## 2022-01-30 NOTE — Patient Outreach (Signed)
  Care Coordination   Initial Visit Note   01/30/2022 Name: Susan Mcmillan MRN: [id number] DOB: 08/16/1945  Susan Mcmillan is a 76 y.o. year old female who sees Carrie Mew, Wilhemena Durie, MD for primary care. I spoke with  [name] by phone today.  What matters to the patients health and wellness today?  Nor concerns expressed. [facility] comes every 3 months. RN discussed services [facility] services, RN, SW, and Pharmacist. Patient declined services.     Goals Addressed             This Visit's Progress    Patient advised to follow up with PCP for vaccines          SDOH assessments and interventions completed:  Yes     Care Coordination Interventions Activated:  Yes  Care Coordination Interventions:  Yes, provided   Follow up plan: No further intervention required.   Encounter Outcome:  Pt. [place] [facility] [phone]

## 2022-01-31 ENCOUNTER — Other Ambulatory Visit: Payer: Self-pay | Admitting: *Deleted

## 2022-02-04 DIAGNOSIS — M5416 Radiculopathy, lumbar region: Secondary | ICD-10-CM | POA: Diagnosis not present

## 2022-02-04 DIAGNOSIS — M48062 Spinal stenosis, lumbar region with neurogenic claudication: Secondary | ICD-10-CM | POA: Diagnosis not present

## 2022-02-05 NOTE — Telephone Encounter (Signed)
/  Letter received from [facility] showing that patient has been approved for Eliquis /free-of-charge from 01/28/22 through 05/12/22. Letter filed in samples closet.

## 2022-02-06 ENCOUNTER — Ambulatory Visit: Payer: PPO | Admitting: Physician Assistant

## 2022-02-07 ENCOUNTER — Ambulatory Visit (INDEPENDENT_AMBULATORY_CARE_PROVIDER_SITE_OTHER): Payer: PPO

## 2022-02-07 ENCOUNTER — Ambulatory Visit: Payer: PPO | Attending: Physician Assistant

## 2022-02-07 DIAGNOSIS — R55 Syncope and collapse: Secondary | ICD-10-CM

## 2022-02-07 LAB — ECHOCARDIOGRAM COMPLETE
AR max vel: 2.24 cm2
AV Area VTI: 2.33 cm2
AV Area mean vel: 2.25 cm2
AV Mean grad: 3 mmHg
AV Peak grad: 5.2 mmHg
Ao pk vel: 1.14 m/s
Area-P 1/2: 4.39 cm2
Calc EF: 51 %
S' Lateral: 3.8 cm
Single Plane A2C EF: 50.1 %
Single Plane A4C EF: 51 %

## 2022-02-18 DIAGNOSIS — M7061 Trochanteric bursitis, right hip: Secondary | ICD-10-CM | POA: Diagnosis not present

## 2022-02-18 DIAGNOSIS — M7062 Trochanteric bursitis, left hip: Secondary | ICD-10-CM | POA: Diagnosis not present

## 2022-03-05 DIAGNOSIS — L814 Other melanin hyperpigmentation: Secondary | ICD-10-CM | POA: Diagnosis not present

## 2022-03-05 DIAGNOSIS — L821 Other seborrheic keratosis: Secondary | ICD-10-CM | POA: Diagnosis not present

## 2022-03-05 DIAGNOSIS — Z85828 Personal history of other malignant neoplasm of skin: Secondary | ICD-10-CM | POA: Diagnosis not present

## 2022-03-05 DIAGNOSIS — L570 Actinic keratosis: Secondary | ICD-10-CM | POA: Diagnosis not present

## 2022-03-05 DIAGNOSIS — D229 Melanocytic nevi, unspecified: Secondary | ICD-10-CM | POA: Diagnosis not present

## 2022-03-11 ENCOUNTER — Encounter: Payer: Self-pay | Admitting: *Deleted

## 2022-03-11 ENCOUNTER — Ambulatory Visit: Payer: PPO | Admitting: Physician Assistant

## 2022-03-19 DIAGNOSIS — H0011 Chalazion right upper eyelid: Secondary | ICD-10-CM | POA: Diagnosis not present

## 2022-03-25 NOTE — Progress Notes (Unsigned)
Cardiology Office Note    Date:  03/27/2022   ID:  Susan, Mcmillan 18-Aug-1945, MRN [id number]  PCP:  Marinda Elk, MD  Cardiologist:  Kathlyn Sacramento, MD  Electrophysiologist:  None   Chief Complaint: Follow-up  History of Present Illness:   Susan Mcmillan is a 76 y.o. 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   She was found to have significant coronary artery calcification on CT imaging in 2017.  Nuclear stress test showed moderate mid to distal anterior wall ischemia with normal EF.  LHC showed an occluded mid LAD with right to left and left to left collaterals.  She otherwise had moderate RCA disease.  She was medically managed.  Carotid artery ultrasound in 04/2019 showed no significant carotid artery disease.  She had worsening heart failure earlier in 2022 with repeat echo in 09/2020 demonstrating an EF of 35 to 03%, grade 1 diastolic dysfunction, and mild mitral regurgitation.  Subsequent R/LHC in 10/2020 showed no significant change when compared to study in 2017 with chronically occluded mid LAD with collaterals as well as moderate proximal stenosis in the first D1 and moderate RCA disease.  RHC showed normal right and left-sided filling pressures, minimal pulmonary hypertension, and severely reduced cardiac output.  Titration of GDMT has been limited by low blood pressure as well as hypotension/rash associated with Entresto.  She was seen in the office in 04/2021 and reported no angina or symptoms concerning for decompensation.  Her biggest issue at that time continued to be dizziness and poor balance with a spinning sensation suspected to be likely vertigo.  She has subsequently dealt with recurrent  vaginal infections/UTIs with recommendation for her to stop Jardiance to decrease the risk of recurrent infections.  However, she preferred to continue Jardiance along with a self recommended homeopathic medication.  She was seen in the office in 10/2021 and remained without symptoms of angina or decompensation.  She had discontinued metoprolol secondary to positional dizziness with noted improvement in symptoms thereafter.  She was without symptoms of presyncope or syncope.  She remained on Jardiance and losartan.   She contacted our office on 01/02/2022 noting multiple episodes of dizziness with near syncope, along with generalized weakness and falls.  Telephone note indicates a systolic blood pressure of 96 mmHg.  Given this, she was evaluated on 01/03/2022 and noted multiple episodes of dizziness with near syncope and 2-3 episodes of frank syncope that were sudden in onset and without associated symptoms.  No postictal period.  She declined head CT.  Zio patch, echo, and carotid artery ultrasound were recommended.  We received a call from [employer] in 01/2022 that her Zio patch showed new onset atrial flutter.  Given this, she was initiated on apixaban with repeat recommendation for head CT, which she declined.  Metoprolol was titrated to 25 mg daily.    She was last seen in the office on 01/22/2022 and was without symptoms of angina or decompensation.  No further syncopal episodes, dizziness, or near syncope.  She reported 1 episode of tachypalpitations, possibly coinciding with documented atrial flutter.  She was without symptoms concerning for bleeding.  Subsequent Zio patch monitoring x2 showed a predominant rhythm of sinus with 1 run of WCT lasting 6  beats felt to likely represent SVT with aberrancy, 2% A-fib/flutter burden with the longest episode lasting 35 minutes and 33 seconds and was detected with symptomatic patient events.  Echo on 02/07/2022 showed an EF of 40 to 45%, hypokinesis of the anterior,  anteroseptal, and apical region, grade 2 diastolic dysfunction, normal RV systolic function and ventricular cavity size, mildly dilated left atrium, mild mitral regurgitation, aortic valve sclerosis without evidence of stenosis, and an estimated right atrial pressure of 3 mmHg.  Carotid artery ultrasound at that time showed no evidence of bilateral ICA pathology with antegrade flow of the bilateral vertebral arteries and normal flow hemodynamics of the bilateral subclavian arteries.  She comes in doing well from a cardiac perspective and is without symptoms of angina or decompensation.  No further near syncope or syncope.  She does continue to note some intermittent positional dizziness.  No palpitations, falls, hematochezia, or melena.  She does continue to smoke with her breakfast and in the afternoon/dinner.  She continues to work on cessation.  Her weight has remained stable.  No significant lower extremity swelling, orthopnea, or abdominal distention.   Labs independently reviewed: 01/2022 - potassium 3.8, BUN 14, serum creatinine 0.73, Hgb 14.2, PLT 339, TSH normal, magnesium 2.0 11/2021 - albumin 3.9, AST/ALT normal 09/2021 - TC 136, TG 181, HDL 46, LDL 54, A1c 6.0  Past Medical History:  Diagnosis Date   Anxiety    CAD (coronary artery disease)    a. 11/2015 MV: mod mid-distal ant ischemia, EF 58% w/ apical HK;  b. 11/2015 Cath: LM nl, LAD 80/185m(fills via collats from RPDA & D1), D1 60ost, LCX small, nl, RCA 50p/m, RPDA nl, RPAV min irregs, RPl1/2/3 min irregs, EF 50-55%.   [facility] ([facility] 03/2017   Dental bridge present    bilateral top.  Permanent retainer - bottom.   Dysuria    History of kidney stones    Hyperlipemia    Hypertension    Hypertensive heart disease    IBS (irritable bowel syndrome)    Ischemic cardiomyopathy    a. 11/2015 V gram: EF 50-55%; b. 09/2020 Echo: EF 35-40%, ant, antsept, apical HK. Gr1 DD. Nl RV fxn. Mild MR.   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 Still smoking ~ 1 cigarette/day.   Urethral caruncle    Vertigo    Vulvar adhesions 10/06/2013   perianal skin bridge noted at posterior fourchette and region of WLE surgical site   Vulvar leukoplakia    Vulvar pain     Past Surgical History:  Procedure Laterality Date   ABDOMINAL HERNIA REPAIR  2014   Dr. WRochel Brome  ABDOMINAL HYSTERECTOMY  1978   APPENDECTOMY     BRAIN SURGERY     CARDIAC CATHETERIZATION Left 12/07/2015   Procedure: Left Heart Cath and Coronary Angiography;  Surgeon: MWellington Hampshire MD;  [place] [facility];  Service: Cardiovascular;  Laterality: Left;   CATARACT EXTRACTION W/PHACO Right 05/28/2018   Procedure: CATARACT EXTRACTION PHACO AND INTRAOCULAR LENS PLACEMENT ([place] RIGHT;  Surgeon: HMarchia Meiers MD;  Location: [facility] ORS;  Service: Ophthalmology;  Laterality: Right;  [id number]:[id number] CDE 7.17 Fluid pack Lot # [id number]  CATARACT EXTRACTION W/PHACO Left 01/06/2019   Procedure: CATARACT EXTRACTION PHACO AND INTRAOCULAR LENS PLACEMENT (IOC) LEFT  00:50.3  19.3%  9.71;  Surgeon: Leandrew Koyanagi, MD;  Location: [facility];  Service: Ophthalmology;  Laterality: Left;   CERVICAL BIOPSY     Dr. Enzo Bi   CHOLECYSTECTOMY     COLONOSCOPY WITH PROPOFOL N/A 02/09/2019   Procedure: COLONOSCOPY WITH PROPOFOL;  Surgeon: Lollie Sails, MD;  Location: [facility] ENDOSCOPY;  Service: Endoscopy;  Laterality: N/A;   ESOPHAGOGASTRODUODENOSCOPY (EGD) WITH PROPOFOL N/A 02/09/2019   Procedure: ESOPHAGOGASTRODUODENOSCOPY (EGD) WITH PROPOFOL;  Surgeon: Lollie Sails, MD;  Location: [facility] ENDOSCOPY;  Service: Endoscopy;  Laterality: N/A;   MOHS SURGERY Right    Leg   RIGHT/LEFT HEART CATH AND CORONARY ANGIOGRAPHY N/A 10/23/2020    Procedure: RIGHT/LEFT HEART CATH AND CORONARY ANGIOGRAPHY;  Surgeon: Wellington Hampshire, MD;  Location: [place] CV LAB;  Service: Cardiovascular;  Laterality: N/A;   SKIN BIOPSY     wide local excision  07/2013   vin 3 w/ margins involoved    Current Medications: Current Meds  Medication Sig   ALPRAZolam (XANAX) 0.25 MG tablet Take by mouth.   apixaban (ELIQUIS) 5 MG TABS tablet Take 1 tablet (5 mg total) by mouth 2 (two) times daily.   atorvastatin (LIPITOR) 80 MG tablet TAKE 1 TABLET(80 MG) BY MOUTH DAILY   Cyanocobalamin (VITAMIN B-12) 5000 MCG SUBL Place 5,000 mcg under the tongue daily as needed (low energy (feeling poorly)).   diphenhydrAMINE (BENADRYL) 25 MG tablet Take 25 mg by mouth at bedtime.   FLUoxetine (PROZAC) 20 MG capsule Take 20 mg by mouth daily.   metoprolol succinate (TOPROL XL) 25 MG 24 hr tablet Take 1 tablet (25 mg total) by mouth daily.   NON FORMULARY Yeast guard 1 qhs   NONFORMULARY OR COMPOUNDED ITEM Apply 1 application  topically 2 (two) times daily as needed (cancer spots). FLUOROURACIL 5% + CALCIPOTRIENE 0.005%   OVER THE COUNTER MEDICATION BEET Capules on occasion   pantoprazole (PROTONIX) 40 MG tablet Take 40 mg by mouth 2 (two) times daily.   prednisoLONE acetate (PRED FORTE) 1 % ophthalmic suspension Right eye    Allergies:   Tape, Codeine, Demerol [meperidine], Erythromycin, Iodinated contrast media, Other, Penicillins, and Entresto [sacubitril-valsartan]   Social History   Socioeconomic History   Marital status: Married    Spouse name: Geneticist, molecular   Number of children: 2   Years of education: Not on file   Highest education level: Not on file  Occupational History   Occupation: retired  Tobacco Use   Smoking status: Some Days    Packs/day: 0.25    Years: 55.00    Total pack years: 13.75    Types: Cigarettes    Last attempt to quit: 12/07/2015    Years since quitting: 6.3   Smokeless tobacco: Never   Tobacco comments:    smokes about 2  cigarettes a day  Vaping Use   Vaping Use: Never used  Substance and Sexual Activity   Alcohol use: Yes    Alcohol/week: 1.0 standard drink of alcohol    Types: 1 Cans of beer per week    Comment: 8 oz Bud Light with supper daily   Drug use: No   Sexual activity: Not Currently    Birth control/protection: Surgical  Other Topics Concern   Not on file  Social History Narrative   Lives in [place]. Previously worked [employer]. Husband with dementia. Children 2 sons.  Social Determinants of Health   Financial Resource Strain: Not on file  Food Insecurity: Not on file  Transportation Needs: Not on file  Physical Activity: Not on file  Stress: Not on file  Social Connections: Not on file     Family History:  The patient's family history includes Bone cancer in her father; Cancer in her father; Congestive Heart Failure in her mother; Dementia in her sister; Hyperlipidemia in her sister; Hypertension in her sister; Macular degeneration in her sister. There is no history of Diabetes or Heart disease.  ROS:   12-point review of systems is negative unless otherwise noted in the HPI   EKGs/Labs/Other Studies Reviewed:    Studies reviewed were summarized above. The additional studies were reviewed today:  Carotid artery ultrasound 02/07/2022: Summary:  Right Carotid: There was no evidence of thrombus, dissection,  atherosclerotic plaque or stenosis in the cervical carotid system.   Left Carotid: There was no evidence of thrombus, dissection,  atherosclerotic plaque or stenosis in the cervical carotid system.   Vertebrals:  Bilateral vertebral arteries demonstrate antegrade flow.  Subclavians: Normal flow hemodynamics were seen in bilateral subclavian arteries.  __________  2D echo 02/07/2022: 1. Left ventricular ejection fraction, by estimation, is 40 to 45%. The  left ventricle has mildly decreased function. The left ventricle  demonstrates regional wall motion abnormalities  (hypokinesis of the  anterior, anteroseptal and apical region). Left  ventricular diastolic parameters are consistent with Grade II diastolic  dysfunction (pseudonormalization).   2. Right ventricular systolic function is normal. The right ventricular  size is normal.   3. Left atrial size was mildly dilated.   4. The mitral valve is normal in structure. Mild mitral valve  regurgitation. No evidence of mitral stenosis.   5. The aortic valve is normal in structure. There is mild calcification  of the aortic valve. Aortic valve regurgitation is not visualized. Aortic  valve sclerosis/calcification is present, without any evidence of aortic  stenosis.   6. The inferior vena cava is normal in size with greater than 50%  respiratory variability, suggesting right atrial pressure of 3 mmHg.  __________  [facility] Re[facility] patch 12/2021: Monitor 1 Patient had a min HR of 51 bpm, max HR of 123 bpm, and avg HR of 71 bpm. Predominant underlying rhythm was Sinus Rhythm.  Rare PACs and rare PVCs.   Monitor 2 Patient had a min HR of 46 bpm, max HR of 188 bpm, and avg HR of 78 bpm. Predominant underlying rhythm was Sinus Rhythm. 1 run of wide-complex tachycardia  lasting 6 beats with a max rate of 126 bpm (avg 109 bpm).  This is likely due to an SVT with aberrancy. Atrial Flutter/fibrillation occurred (2% burden), ranging from 87-188 bpm (avg of 140 bpm), the longest lasting 35 mins 33 secs with an avg rate of 154 bpm. Atrial Flutter was detected within +/- 45 seconds of symptomatic patient event(s).  Rare PACs and rare PVCs. __________  [facility] 10/23/2020: Ost 1st Diag to 1st Diag lesion is 60% stenosed. Prox RCA to Mid RCA lesion is 50% stenosed. Mid LAD-1 lesion is 80% stenosed. Mid LAD-2 lesion is 100% stenosed. 1st Diag lesion is 60% stenosed.   1.  No significant change since 2017.  Chronically occluded mid LAD with collaterals from a large first diagonal as well as right coronary artery.  The first diagonal  has moderate proximal stenosis which has been stable.  RCA has diffuse 50% disease which also has been stable. 2.  Left  ventricular angiography was not performed.  EF was moderately reduced by echo. 3.  Right heart catheterization showed normal right and left-sided filling pressures, minimal pulmonary hypertension and severely reduced cardiac output.   Recommendations: No indication for revascularization.  Recommend optimizing heart failure medications. We will plan on switching the patient to [facility], adding spironolactone and likely adding an SGLT2 inhibitor. __________   2D echo 09/28/2020: 1. Left ventricular ejection fraction, by estimation, is 35 to 40%. The  left ventricle has moderately decreased function. The left ventricle  demonstrates regional wall motion abnormalities (anterior, anteroseptal,  apical hypokinesis). Left ventricular  diastolic parameters are consistent with Grade I diastolic dysfunction  (impaired relaxation).   2. Right ventricular systolic function is normal. The right ventricular  size is normal. Tricuspid regurgitation signal is inadequate for assessing  PA pressure.   3. The mitral valve is normal in structure. Mild mitral valve  regurgitation. __________   Carotid artery ultrasound 04/19/2019: Summary:  Right Carotid: There is no evidence of stenosis in the right ICA.   Left Carotid: There is no evidence of stenosis in the left ICA.   Vertebrals:  Bilateral vertebral arteries demonstrate antegrade flow.  Subclavians: Normal flow hemodynamics were seen in bilateral subclavian arteries. __________   LHC 12/07/2015: The left ventricular systolic function is normal. LV end diastolic pressure is normal. The left ventricular ejection fraction is 50-55% by visual estimate. There is no mitral valve regurgitation. There is no aortic valve stenosis. Prox RCA to Mid RCA lesion, 50 %stenosed. Mid LAD lesion, 100 %stenosed. Ost 1st Diag to 1st Diag lesion,  60 %stenosed. Prox LAD to Mid LAD lesion, 80 %stenosed.   1.severe one-vessel coronary artery disease with chronically occluded mid LAD with faint right to left collaterals and left to left collaterals from the diagonal branch. Otherwise there is moderate disease affecting the mid RCA and ostial first diagonal which is large in size. 2. Low normal LV systolic function with an ejection fraction of 50-55% with mild anterior wall hypokinesis.moderately elevated filling left ventricular end-diastolic pressure but blood pressure was also moderately elevated at that time.   Recommendations: Continue aggressive medical therapy. Recommend a target LDL of less than 70. The LAD itself appears to be diffusely diseased in the proximal and midsegment. I doubt that she would get much benefit from LAD CTO PCI. __________   Lexiscan MPI 11/30/2015:There was no ST segment deviation noted during stress. No T wave inversion was noted during stress.   Pharmacological myocardial perfusion imaging study with small region of fixed defect in the apical region, With small to moderate sized region of moderate ischemia noted in the mid to distal anterior wall  Hypokinesis in the apical region EF estimated at 58% No EKG changes concerning for ischemia at peak stress or in recovery. Baseline EKG with T wave abnormality through the precordial leads consistent with ischemia Moderate risk scan   EKG:  EKG is not ordered today.    Recent Labs: 12/04/2021: ALT 19 01/22/2022: BUN 14; Creatinine, Ser 0.73; Hemoglobin 14.2; Magnesium 2.0; Platelets 339; Potassium 3.8; Sodium 139; TSH 1.839  Recent Lipid Panel    Component Value Date/Time   CHOL 149 02/06/2016 0953   TRIG 155 (H) 02/06/2016 0953   HDL 54 02/06/2016 0953   CHOLHDL 2.8 02/06/2016 0953   CHOLHDL 4 05/17/2015 1442   VLDL 57.6 (H) 05/17/2015 1442   LDLCALC 64 02/06/2016 0953   LDLDIRECT 88.0 05/17/2015 1442    PHYSICAL EXAM:    VS:  BP 126/62 (BP  Location: Left Arm, Patient Position: Sitting, Cuff Size: Normal)   Pulse (!) 56   Ht '5\' 4"'$  (1.626 m)   Wt 154 lb 3.2 oz (69.9 kg)   SpO2 99%   BMI 26.47 kg/m   BMI: Body mass index is 26.47 kg/m.  Physical Exam Vitals reviewed.  Constitutional:      Appearance: She is well-developed.  HENT:     Head: Normocephalic and atraumatic.  Eyes:     General:        Right eye: No discharge.        Left eye: No discharge.  Neck:     Vascular: No JVD.  Cardiovascular:     Rate and Rhythm: Normal rate and regular rhythm.     Heart sounds: Normal heart sounds, S1 normal and S2 normal. Heart sounds not distant. No midsystolic click and no opening snap. No murmur heard.    No friction rub.  Pulmonary:     Effort: Pulmonary effort is normal. No respiratory distress.     Breath sounds: Normal breath sounds. No decreased breath sounds, wheezing or rales.  Chest:     Chest wall: No tenderness.  Abdominal:     General: There is no distension.  Musculoskeletal:     Cervical back: Normal range of motion.  Skin:    General: Skin is warm and dry.     Nails: There is no clubbing.  Neurological:     Mental Status: She is alert and oriented to person, place, and time.  Psychiatric:        Speech: Speech normal.        Behavior: Behavior normal.        Thought Content: Thought content normal.        Judgment: Judgment normal.     Wt Readings from Last 3 Encounters:  03/27/22 154 lb 3.2 oz (69.9 kg)  01/22/22 156 lb 9.6 oz (71 kg)  01/03/22 152 lb 6 oz (69.1 kg)     ASSESSMENT & PLAN:   Atrial flutter: Maintaining sinus rhythm with Toprol-XL 25 mg daily.  CHA2DS2-VASc at least 6 (CHF, HTN, age x2, vascular disease, sex category).  She remains on apixaban 5 mg twice daily and does not meet reduced dosing criteria.  Recent labs showed stable renal function, electrolytes, and Hgb.  CAD involving the native coronary arteries without angina: She is doing well and is without symptoms of angina  or decompensation.  Continue aggressive risk factor modification.  She remains on apixaban a place of aspirin given underlying atrial flutter, to minimize bleeding risk.  Continue atorvastatin and metoprolol.  Dizziness with near syncope and syncope: Historically, episodes of dizziness and syncope have been felt to be vertiginous in etiology.  No further episodes of near syncope/syncope.  Echo showed no significant structural abnormalities.  Zio patch showed no significant prolonged pauses, including posttermination, or high-grade AV block.  She is refraining from driving for 6 months until most recent syncopal episode.  Previously declined head CT.  HFrEF secondary to ICM: She appears euvolemic and well compensated.  Weight down 2 pounds by our scale when compared to her visit in 01/2022.  She remains on Toprol-XL 25 mg daily.  Most recent echo showed an improvement in her LV systolic function with an EF of 45%.  Relative hypotension and orthostasis continue to preclude escalation of GDMT.  Not on SGLT2 inhibitor secondary to the above and with significant UTI/yeast infection.  HTN: Blood pressure is  well controlled in the office today.  Continue medical therapy as outlined above.  HLD: LDL 54 in 09/2021 with normal AST/ALT in 11/2021.  She remains on atorvastatin.  Tobacco use: Down to a couple puffs of cigarette several times per day.  Congratulations were offered.  Complete cessation is encouraged.  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  Prior documentation indicates she has a dominant ulnar artery that was previously noted to be widely patent.  This does not require further management, though does preclude future catheterization via the right radial artery.   Disposition: F/u with Dr. Fletcher Anon or an APP in 6 months.   Medication Adjustments/Labs and Tests Ordered: Current medicines are reviewed at length with the patient today.  Concerns regarding medicines are outlined  above. Medication changes, Labs and Tests ordered today are summarized above and listed in the Patient Instructions accessible in Encounters.   Signed, Christell Faith, PA-C 03/27/2022 2:49 PM     [address] [place], [place] [address] [phone]

## 2022-03-26 DIAGNOSIS — B0233 Zoster keratitis: Secondary | ICD-10-CM | POA: Diagnosis not present

## 2022-03-27 ENCOUNTER — Encounter: Payer: Self-pay | Admitting: Physician Assistant

## 2022-03-27 ENCOUNTER — Ambulatory Visit: Payer: PPO | Attending: Physician Assistant | Admitting: Physician Assistant

## 2022-03-27 VITALS — BP 126/62 | HR 56 | Ht 64.0 in | Wt 154.2 lb

## 2022-03-27 DIAGNOSIS — I5022 Chronic systolic (congestive) heart failure: Secondary | ICD-10-CM | POA: Diagnosis not present

## 2022-03-27 DIAGNOSIS — I4892 Unspecified atrial flutter: Secondary | ICD-10-CM

## 2022-03-27 DIAGNOSIS — R55 Syncope and collapse: Secondary | ICD-10-CM

## 2022-03-27 DIAGNOSIS — I10 Essential (primary) hypertension: Secondary | ICD-10-CM

## 2022-03-27 DIAGNOSIS — R42 Dizziness and giddiness: Secondary | ICD-10-CM | POA: Diagnosis not present

## 2022-03-27 DIAGNOSIS — I2510 Atherosclerotic heart disease of native coronary artery without angina pectoris: Secondary | ICD-10-CM

## 2022-03-27 DIAGNOSIS — Z720 Tobacco use: Secondary | ICD-10-CM | POA: Diagnosis not present

## 2022-03-27 DIAGNOSIS — E785 Hyperlipidemia, unspecified: Secondary | ICD-10-CM | POA: Diagnosis not present

## 2022-03-27 DIAGNOSIS — I255 Ischemic cardiomyopathy: Secondary | ICD-10-CM | POA: Diagnosis not present

## 2022-03-27 DIAGNOSIS — I70208 Unspecified atherosclerosis of native arteries of extremities, other extremity: Secondary | ICD-10-CM

## 2022-03-27 NOTE — Patient Instructions (Signed)
Medication Instructions:  No changes at this time.   *If you need a refill on your cardiac medications before your next appointment, please call your pharmacy*   Lab Work: None  If you have labs (blood work) drawn today and your tests are completely normal, you will receive your results only by: [place] (if you have MyChart) OR A paper copy in the mail If you have any lab test that is abnormal or we need to change your treatment, we will call you to review the results.   Testing/Procedures: None   Follow-Up: At [facility], you and your health needs are our priority.  As part of our continuing mission to provide you with exceptional heart care, we have created designated Provider Care Teams.  These Care Teams include your primary Cardiologist (physician) and Advanced Practice Providers (APPs -  Physician Assistants and Nurse Practitioners) who all work together to provide you with the care you need, when you need it.   Your next appointment:   6 month(s)  The format for your next appointment:   In Person  Provider:   Kathlyn Sacramento, MD or Christell Faith, PA-C       Important Information About Sugar

## 2022-03-28 ENCOUNTER — Telehealth: Payer: Self-pay | Admitting: Cardiovascular Disease

## 2022-03-28 NOTE — Telephone Encounter (Signed)
Pt states she needs a call back from [facility] in regards to paperwork. Requesting call back.

## 2022-03-29 NOTE — Telephone Encounter (Signed)
Spoke with patient she received renewal application for her Eliquis. It has the incorrect office of [facility]. Advised that she can discard that application because she will need to spend at least 3% of her income on medications before we can reapply. She verbalized understanding with no further questions at this time.

## 2022-04-16 ENCOUNTER — Other Ambulatory Visit: Payer: Self-pay | Admitting: Cardiovascular Disease

## 2022-04-23 DIAGNOSIS — R7303 Prediabetes: Secondary | ICD-10-CM | POA: Diagnosis not present

## 2022-04-23 DIAGNOSIS — E782 Mixed hyperlipidemia: Secondary | ICD-10-CM | POA: Diagnosis not present

## 2022-04-23 DIAGNOSIS — I10 Essential (primary) hypertension: Secondary | ICD-10-CM | POA: Diagnosis not present

## 2022-04-30 DIAGNOSIS — Z0000 Encounter for general adult medical examination without abnormal findings: Secondary | ICD-10-CM | POA: Diagnosis not present

## 2022-04-30 DIAGNOSIS — I4892 Unspecified atrial flutter: Secondary | ICD-10-CM | POA: Diagnosis not present

## 2022-04-30 DIAGNOSIS — C8510 Unspecified B-cell lymphoma, unspecified site: Secondary | ICD-10-CM | POA: Diagnosis not present

## 2022-04-30 DIAGNOSIS — R7303 Prediabetes: Secondary | ICD-10-CM | POA: Diagnosis not present

## 2022-04-30 DIAGNOSIS — I2583 Coronary atherosclerosis due to lipid rich plaque: Secondary | ICD-10-CM | POA: Diagnosis not present

## 2022-04-30 DIAGNOSIS — E782 Mixed hyperlipidemia: Secondary | ICD-10-CM | POA: Diagnosis not present

## 2022-04-30 DIAGNOSIS — D071 Carcinoma in situ of vulva: Secondary | ICD-10-CM | POA: Diagnosis not present

## 2022-04-30 DIAGNOSIS — Z780 Asymptomatic menopausal state: Secondary | ICD-10-CM | POA: Diagnosis not present

## 2022-04-30 DIAGNOSIS — I2510 Atherosclerotic heart disease of native coronary artery without angina pectoris: Secondary | ICD-10-CM | POA: Diagnosis not present

## 2022-04-30 DIAGNOSIS — I10 Essential (primary) hypertension: Secondary | ICD-10-CM | POA: Diagnosis not present

## 2022-04-30 DIAGNOSIS — D329 Benign neoplasm of meninges, unspecified: Secondary | ICD-10-CM | POA: Diagnosis not present

## 2022-05-08 DIAGNOSIS — M5416 Radiculopathy, lumbar region: Secondary | ICD-10-CM | POA: Diagnosis not present

## 2022-05-08 DIAGNOSIS — M48062 Spinal stenosis, lumbar region with neurogenic claudication: Secondary | ICD-10-CM | POA: Diagnosis not present

## 2022-05-21 DIAGNOSIS — N390 Urinary tract infection, site not specified: Secondary | ICD-10-CM | POA: Diagnosis not present

## 2022-05-21 DIAGNOSIS — J209 Acute bronchitis, unspecified: Secondary | ICD-10-CM | POA: Diagnosis not present

## 2022-05-21 DIAGNOSIS — R319 Hematuria, unspecified: Secondary | ICD-10-CM | POA: Diagnosis not present

## 2022-05-21 DIAGNOSIS — R531 Weakness: Secondary | ICD-10-CM | POA: Diagnosis not present

## 2022-05-21 DIAGNOSIS — R399 Unspecified symptoms and signs involving the genitourinary system: Secondary | ICD-10-CM | POA: Diagnosis not present

## 2022-05-29 ENCOUNTER — Other Ambulatory Visit: Payer: Self-pay | Admitting: Cardiovascular Disease

## 2022-05-29 DIAGNOSIS — E785 Hyperlipidemia, unspecified: Secondary | ICD-10-CM

## 2022-06-03 DIAGNOSIS — S335XXA Sprain of ligaments of lumbar spine, initial encounter: Secondary | ICD-10-CM | POA: Diagnosis not present

## 2022-06-03 DIAGNOSIS — M9904 Segmental and somatic dysfunction of sacral region: Secondary | ICD-10-CM | POA: Diagnosis not present

## 2022-06-03 DIAGNOSIS — M9901 Segmental and somatic dysfunction of cervical region: Secondary | ICD-10-CM | POA: Diagnosis not present

## 2022-06-03 DIAGNOSIS — M9903 Segmental and somatic dysfunction of lumbar region: Secondary | ICD-10-CM | POA: Diagnosis not present

## 2022-06-03 DIAGNOSIS — M5417 Radiculopathy, lumbosacral region: Secondary | ICD-10-CM | POA: Diagnosis not present

## 2022-06-03 DIAGNOSIS — M9902 Segmental and somatic dysfunction of thoracic region: Secondary | ICD-10-CM | POA: Diagnosis not present

## 2022-06-03 DIAGNOSIS — M9905 Segmental and somatic dysfunction of pelvic region: Secondary | ICD-10-CM | POA: Diagnosis not present

## 2022-06-27 DIAGNOSIS — M5416 Radiculopathy, lumbar region: Secondary | ICD-10-CM | POA: Diagnosis not present

## 2022-06-27 DIAGNOSIS — M1712 Unilateral primary osteoarthritis, left knee: Secondary | ICD-10-CM | POA: Diagnosis not present

## 2022-06-27 DIAGNOSIS — M1612 Unilateral primary osteoarthritis, left hip: Secondary | ICD-10-CM | POA: Diagnosis not present

## 2022-06-27 DIAGNOSIS — M48062 Spinal stenosis, lumbar region with neurogenic claudication: Secondary | ICD-10-CM | POA: Diagnosis not present

## 2022-06-28 DIAGNOSIS — B0233 Zoster keratitis: Secondary | ICD-10-CM | POA: Diagnosis not present

## 2022-07-02 DIAGNOSIS — L309 Dermatitis, unspecified: Secondary | ICD-10-CM | POA: Diagnosis not present

## 2022-07-02 DIAGNOSIS — L578 Other skin changes due to chronic exposure to nonionizing radiation: Secondary | ICD-10-CM | POA: Diagnosis not present

## 2022-07-02 DIAGNOSIS — D1801 Hemangioma of skin and subcutaneous tissue: Secondary | ICD-10-CM | POA: Diagnosis not present

## 2022-07-02 DIAGNOSIS — L814 Other melanin hyperpigmentation: Secondary | ICD-10-CM | POA: Diagnosis not present

## 2022-07-02 DIAGNOSIS — D492 Neoplasm of unspecified behavior of bone, soft tissue, and skin: Secondary | ICD-10-CM | POA: Diagnosis not present

## 2022-07-02 DIAGNOSIS — L821 Other seborrheic keratosis: Secondary | ICD-10-CM | POA: Diagnosis not present

## 2022-07-02 DIAGNOSIS — L570 Actinic keratosis: Secondary | ICD-10-CM | POA: Diagnosis not present

## 2022-07-14 ENCOUNTER — Other Ambulatory Visit: Payer: Self-pay | Admitting: Physician Assistant

## 2022-07-15 DIAGNOSIS — M1612 Unilateral primary osteoarthritis, left hip: Secondary | ICD-10-CM | POA: Diagnosis not present

## 2022-08-07 DIAGNOSIS — M8588 Other specified disorders of bone density and structure, other site: Secondary | ICD-10-CM | POA: Diagnosis not present

## 2022-08-23 DIAGNOSIS — M5416 Radiculopathy, lumbar region: Secondary | ICD-10-CM | POA: Diagnosis not present

## 2022-08-23 DIAGNOSIS — M48062 Spinal stenosis, lumbar region with neurogenic claudication: Secondary | ICD-10-CM | POA: Diagnosis not present

## 2022-09-26 ENCOUNTER — Encounter: Payer: Self-pay | Admitting: Cardiovascular Disease

## 2022-09-26 ENCOUNTER — Ambulatory Visit: Payer: PPO | Attending: Cardiovascular Disease | Admitting: Cardiovascular Disease

## 2022-09-26 VITALS — BP 110/60 | HR 65 | Ht 62.0 in | Wt 154.5 lb

## 2022-09-26 DIAGNOSIS — I5022 Chronic systolic (congestive) heart failure: Secondary | ICD-10-CM

## 2022-09-26 DIAGNOSIS — I4892 Unspecified atrial flutter: Secondary | ICD-10-CM

## 2022-09-26 DIAGNOSIS — I10 Essential (primary) hypertension: Secondary | ICD-10-CM | POA: Diagnosis not present

## 2022-09-26 DIAGNOSIS — E785 Hyperlipidemia, unspecified: Secondary | ICD-10-CM | POA: Diagnosis not present

## 2022-09-26 DIAGNOSIS — I2510 Atherosclerotic heart disease of native coronary artery without angina pectoris: Secondary | ICD-10-CM

## 2022-09-26 MED ORDER — APIXABAN 5 MG PO TABS: 5.0000 mg | ORAL_TABLET | Freq: Two times a day (BID) | ORAL | 3 refills | Status: DC

## 2022-09-26 MED ORDER — METOPROLOL SUCCINATE ER 25 MG PO TB24: ORAL_TABLET | ORAL | 3 refills | Status: DC

## 2022-09-26 NOTE — Patient Instructions (Signed)
Medication Instructions:  No changes *If you need a refill on your cardiac medications before your next appointment, please call your pharmacy*   Lab Work: None ordered If you have labs (blood work) drawn today and your tests are completely normal, you will receive your results only by: MyChart Message (if you have MyChart) OR A paper copy in the mail If you have any lab test that is abnormal or we need to change your treatment, we will call you to review the results.   Testing/Procedures: None ordered   Follow-Up: At [facility], you and your health needs are our priority.  As part of our continuing mission to provide you with exceptional heart care, we have created designated Provider Care Teams.  These Care Teams include your primary Cardiologist (physician) and Advanced Practice Providers (APPs -  Physician Assistants and Nurse Practitioners) who all work together to provide you with the care you need, when you need it.  We recommend signing up for the patient portal called "MyChart".  Sign up information is provided on this After Visit Summary.  MyChart is used to connect with patients for Virtual Visits (Telemedicine).  Patients are able to view lab/test results, encounter notes, upcoming appointments, etc.  Non-urgent messages can be sent to your provider as well.   To learn more about what you can do with MyChart, go to [email or web address].    Your next appointment:   6 month(s)  Provider:   You may see Muhammad Arida, MD or one of the following Advanced Practice Providers on your designated Care Team:   Christopher Berge, NP Ryan Dunn, PA-C Cadence Furth, PA-C Sheri Hammock, NP    

## 2022-09-26 NOTE — Progress Notes (Signed)
Cardiology Office Note   Date:  09/26/2022   ID:  Hazzel, Schippers [birth year], MRN [id number]  PCP:  Patrice Paradise, MD  Cardiologist:   Lorine Bears, MD   Chief Complaint  Patient presents with   Follow-up    6 month f/u c/o when lifting her right arm causes dizziness . Meds reviewed verbally with pt.       History of Present Illness: TASHVI FRANCIES is a 77 y.o. female who is here today for a follow-up visit regarding coronary artery disease, chronic diastolic heart failure and atrial flutter. 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  She was found to have significant coronary artery atherosclerosis and calcifications on previous CT scan in 2017. Nuclear stress test showed moderate mid to distal anterior wall ischemia with normal ejection fraction.  Cardiac catheterization showed occluded mid LAD with right to left collaterals and left to left collaterals.  She otherwise had moderate RCA disease.   She has been treated medically since then.   Carotid Doppler in December 2020 showed no significant carotid disease.  She had worsening heart failure in 2022.   Repeat echocardiogram in May of 2022 showed decreased ejection fraction to 35 to 40% with grade 1 diastolic dysfunction and mild mitral regurgitation.  Right and left cardiac catheterization was performed in June of 2022 which showed no significant change since 2017 with chronically occluded mid LAD with collaterals, moderate proximal stenosis in first diagonal and moderate RCA disease.  Right heart catheterization showed normal right and left-sided filling pressures, minimal pulmonary hypertension and severely reduced cardiac output.  She developed a rash and hypotension with Entresto.  Advancing heart  failure medications have been limited by low blood pressure.    Jardiance was associated with UTIs and thus was discontinued.  She was seen in August 2023 due to dizziness and near syncope.  Outpatient monitor showed intermittent episodes of atrial flutter.  Echocardiogram in September 2023 showed an EF of 40 to 45%.  She had symptomatic hypotension that required stopping losartan.  She has been doing reasonably well from a cardiac standpoint with no chest pain or worsening dyspnea.  She continues to complain of vertigo especially in certain positions.  Past Medical History:  Diagnosis Date   Anxiety    CAD (coronary artery disease)    a. 11/2015 MV: mod mid-distal ant ischemia, EF 58% w/ apical HK;  b. 11/2015 Cath: LM nl, LAD 80/19m (fills via collats from RPDA & D1), D1 60ost, LCX small, nl, RCA 50p/m, RPDA nl, RPAV min irregs, RPl1/2/3 min irregs, EF 50-55%.   Cancer of brain [facility]) 03/2017   Dental bridge present    bilateral top.  Permanent retainer - bottom.   Dysuria    History of kidney stones    Hyperlipemia    Hypertension    Hypertensive heart disease    IBS (irritable bowel syndrome)    Ischemic cardiomyopathy    a. 11/2015 V gram: EF 50-55%; b. 09/2020 Echo: EF 35-40%, ant, antsept, apical HK. Gr1 DD. Nl RV fxn. Mild MR.   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 Still smoking ~ 1 cigarette/day.   Urethral caruncle    Vertigo    Vulvar adhesions 10/06/2013   perianal skin bridge noted at posterior fourchette and region of WLE surgical site   Vulvar leukoplakia    Vulvar pain     Past Surgical History:  Procedure Laterality Date   ABDOMINAL HERNIA REPAIR  2014   Dr. Renda Rolls   ABDOMINAL HYSTERECTOMY  1978   APPENDECTOMY     BRAIN  SURGERY     CARDIAC CATHETERIZATION Left 12/07/2015   Procedure: Left Heart Cath and Coronary Angiography;  Surgeon: Iran Ouch, MD;  Location: [facility] INVASIVE CV LAB;  Service: Cardiovascular;  Laterality: Left;   CATARACT EXTRACTION W/PHACO Right 05/28/2018   Procedure: CATARACT EXTRACTION PHACO AND INTRAOCULAR LENS PLACEMENT (IOC) RIGHT;  Surgeon: Elliot Cousin, MD;  Location: [facility] ORS;  Service: Ophthalmology;  Laterality: Right;  [place] 00:56 CDE 7.17 Fluid pack Lot # [id number]   CATARACT EXTRACTION W/PHACO Left 01/06/2019   Procedure: CATARACT EXTRACTION PHACO AND INTRAOCULAR LENS PLACEMENT (IOC) LEFT  00:50.3  19.3%  9.71;  Surgeon: Lockie Mola, MD;  Location: [facility] SUR[facility]ERY [facility];  Service: Ophthalmology;  Laterality: Left;   CERVICAL BIOPSY     Dr. Greggory Keen   CHOLECYSTECTOMY     COLONOSCOPY WITH PROPOFOL N/A 02/09/2019   Procedure: COLONOSCOPY WITH PROPOFOL;  Surgeon: Christena Deem, MD;  Location: [facility] ENDOSCOPY;  Service: Endoscopy;  Laterality: N/A;   ESOPHAGOGASTRODUODENOSCOPY (EGD) WITH PROPOFOL N/A 02/09/2019   Procedure: ESOPHAGOGASTRODUODENOSCOPY (EGD) WITH PROPOFOL;  Surgeon: Christena Deem, MD;  Location: [facility] ENDOSCOPY;  Service: Endoscopy;  Laterality: N/A;   MOHS SURGERY Right    Leg   RIGHT/LEFT HEART CATH AND CORONARY ANGIOGRAPHY N/A 10/23/2020   Procedure: RIGHT/LEFT HEART CATH AND CORONARY ANGIOGRAPHY;  Surgeon: Iran Ouch, MD;  Location: [facility] INVASIVE CV LAB;  Service: Cardiovascular;  Laterality: N/A;   SKIN BIOPSY     wide local excision  07/2013   vin 3 w/ margins involoved     Current Outpatient Medications  Medication Sig Dispense Refill   ALPRAZolam (XANAX) 0.25 MG tablet Take by mouth.     apixaban (ELIQUIS) 5 MG TABS tablet Take 1 tablet (5 mg total) by mouth 2 (two) times daily. 60 tablet 1   atorvastatin (LIPITOR) 80 MG tablet TAKE 1 TABLET(80 MG) BY MOUTH DAILY (Patient taking differently: Take 40 mg by mouth daily.) 90 tablet 1    Cyanocobalamin (VITAMIN B-12) 5000 MCG SUBL Place 5,000 mcg under the tongue daily as needed (low energy (feeling poorly)).     diphenhydrAMINE (BENADRYL) 25 MG tablet Take 25 mg by mouth at bedtime.     FLUoxetine (PROZAC) 20 MG capsule Take 20 mg by mouth daily.     hyoscyamine (LEVSIN SL) 0.125 MG SL tablet Place 0.125 mg under the tongue every 6 (six) hours as needed. Place 1 tablet under the tongue every 6 hours as needed for cramping for up to 10 days     metoprolol succinate (TOPROL-XL) 25 MG 24 hr tablet TAKE 1 TABLET(25 MG) BY MOUTH DAILY 90 tablet 0   NONFORMULARY OR COMPOUNDED ITEM Apply 1 application  topically 2 (two) times daily as needed (cancer spots). FLUOROURACIL 5% + CALCIPOTRIENE 0.005%     pantoprazole (PROTONIX) 40 MG tablet Take 40 mg by mouth daily.     prednisoLONE acetate (PRED FORTE) 1 %  ophthalmic suspension Right eye     No current facility-administered medications for this visit.    Allergies:   Tape, Codeine, Demerol [meperidine], Erythromycin, Iodinated contrast media, Other, Penicillins, and Entresto [sacubitril-valsartan]    Social History:  The patient  reports that she has been smoking cigarettes. She has a 13.75 pack-year smoking history. She has never used smokeless tobacco. She reports current alcohol use of about 1.0 standard drink of alcohol per week. She reports that she does not use drugs.   Family History:  The patient's family history includes Bone cancer in her father; Cancer in her father; Congestive Heart Failure in her mother; Dementia in her sister; Hyperlipidemia in her sister; Hypertension in her sister; Macular degeneration in her sister.    ROS:  Please see the history of present illness.   Otherwise, review of systems are positive for none.   All other systems are reviewed and negative.    PHYSICAL EXAM: VS:  BP 110/60 (BP Location: Left Arm, Patient Position: Sitting, Cuff Size: Normal)   Pulse 65   Ht 5\' 2"  (1.575 m)   Wt 154 lb 8 oz  (70.1 kg)   SpO2 98%   BMI 28.26 kg/m  , BMI Body mass index is 28.26 kg/m. GEN: Well nourished, well developed, in no acute distress  HEENT: normal  Neck: no JVD, carotid bruits, or masses Cardiac: RRR; no murmurs, rubs, or gallops,no edema  Respiratory:  clear to auscultation bilaterally, normal work of breathing GI: soft, nontender, nondistended, + BS MS: no deformity or atrophy  Skin: warm and dry, no rash Neuro:  Strength and sensation are intact Psych: euthymic mood, full affect    EKG:  EKG is ordered today. Sinus bradycardia with normal sinus rhythm with nonspecific ST changes.   Recent Labs: 12/04/2021: ALT 19 01/22/2022: BUN 14; Creatinine, Ser 0.73; Hemoglobin 14.2; Magnesium 2.0; Platelets 339; Potassium 3.8; Sodium 139; TSH 1.839    Lipid Panel    Component Value Date/Time   CHOL 149 02/06/2016 0953   TRIG 155 (H) 02/06/2016 0953   HDL 54 02/06/2016 0953   CHOLHDL 2.8 02/06/2016 0953   CHOLHDL 4 05/17/2015 1442   VLDL 57.6 (H) 05/17/2015 1442   LDLCALC 64 02/06/2016 0953   LDLDIRECT 88.0 05/17/2015 1442      Wt Readings from Last 3 Encounters:  09/26/22 154 lb 8 oz (70.1 kg)  03/27/22 154 lb 3.2 oz (69.9 kg)  01/22/22 156 lb 9.6 oz (71 kg)       ASSESSMENT AND PLAN:  1.  Coronary artery disease involving native coronary arteries without angina:  She has chronically occluded LAD with collaterals and moderate diagonal and RCA disease.  Recommend aggressive medical therapy.  2.  Chronic systolic heart failure: Most recent echocardiogram in September 2023 showed an EF of 40 to 45%.  She appears to be euvolemic.  Continue Toprol.  She is no longer on losartan due to symptomatic hypotension.  She also did not tolerate Jardiance due to frequent infections.  3.  Essential hypertension: Blood pressure is controlled.  4.  Hyperlipidemia: Continue treatment with atorvastatin 40 mg once daily.  Most recent lipid profile showed an LDL of 79.  5.  Tobacco use:  She continues to smoke few cigarettes a day.  I discussed the importance of smoking cessation.  6.  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  She does have a dominant ulnar artery that was widely patent.  This does not  require further management but precludes future catheterization via the right radial artery.  7.  Paroxysmal atrial flutter: She is maintaining in sinus rhythm with Toprol.  Eliquis was refilled.  8.  Dizziness: Her symptoms are consistent with benign positional vertigo.  She is going to discuss with her primary Care physician possible referral to ENT.   Disposition:   FU with me in 6 months.  Signed,  Lorine Bears, MD  09/26/2022 3:07 PM    [facility]

## 2022-09-27 ENCOUNTER — Other Ambulatory Visit: Payer: Self-pay | Admitting: Physician Assistant

## 2022-09-27 DIAGNOSIS — I4892 Unspecified atrial flutter: Secondary | ICD-10-CM

## 2022-09-27 DIAGNOSIS — H43813 Vitreous degeneration, bilateral: Secondary | ICD-10-CM | POA: Diagnosis not present

## 2022-09-27 DIAGNOSIS — H18523 Epithelial (juvenile) corneal dystrophy, bilateral: Secondary | ICD-10-CM | POA: Diagnosis not present

## 2022-09-27 DIAGNOSIS — Z961 Presence of intraocular lens: Secondary | ICD-10-CM | POA: Diagnosis not present

## 2022-09-27 DIAGNOSIS — B0233 Zoster keratitis: Secondary | ICD-10-CM | POA: Diagnosis not present

## 2022-09-27 NOTE — Telephone Encounter (Signed)
Refill request

## 2022-10-01 DIAGNOSIS — L820 Inflamed seborrheic keratosis: Secondary | ICD-10-CM | POA: Diagnosis not present

## 2022-10-01 DIAGNOSIS — D492 Neoplasm of unspecified behavior of bone, soft tissue, and skin: Secondary | ICD-10-CM | POA: Diagnosis not present

## 2022-10-01 DIAGNOSIS — L578 Other skin changes due to chronic exposure to nonionizing radiation: Secondary | ICD-10-CM | POA: Diagnosis not present

## 2022-10-01 DIAGNOSIS — L570 Actinic keratosis: Secondary | ICD-10-CM | POA: Diagnosis not present

## 2022-10-01 DIAGNOSIS — L821 Other seborrheic keratosis: Secondary | ICD-10-CM | POA: Diagnosis not present

## 2022-10-01 DIAGNOSIS — L814 Other melanin hyperpigmentation: Secondary | ICD-10-CM | POA: Diagnosis not present

## 2022-10-01 DIAGNOSIS — L720 Epidermal cyst: Secondary | ICD-10-CM | POA: Diagnosis not present

## 2022-10-01 DIAGNOSIS — Z85828 Personal history of other malignant neoplasm of skin: Secondary | ICD-10-CM | POA: Diagnosis not present

## 2022-10-01 DIAGNOSIS — D229 Melanocytic nevi, unspecified: Secondary | ICD-10-CM | POA: Diagnosis not present

## 2022-10-01 DIAGNOSIS — D692 Other nonthrombocytopenic purpura: Secondary | ICD-10-CM | POA: Diagnosis not present

## 2022-10-14 ENCOUNTER — Other Ambulatory Visit: Payer: Self-pay | Admitting: Physician Assistant

## 2022-10-17 DIAGNOSIS — M5417 Radiculopathy, lumbosacral region: Secondary | ICD-10-CM | POA: Diagnosis not present

## 2022-10-17 DIAGNOSIS — M9903 Segmental and somatic dysfunction of lumbar region: Secondary | ICD-10-CM | POA: Diagnosis not present

## 2022-10-17 DIAGNOSIS — M9905 Segmental and somatic dysfunction of pelvic region: Secondary | ICD-10-CM | POA: Diagnosis not present

## 2022-10-17 DIAGNOSIS — M9904 Segmental and somatic dysfunction of sacral region: Secondary | ICD-10-CM | POA: Diagnosis not present

## 2022-10-17 DIAGNOSIS — M9902 Segmental and somatic dysfunction of thoracic region: Secondary | ICD-10-CM | POA: Diagnosis not present

## 2022-10-17 DIAGNOSIS — M9901 Segmental and somatic dysfunction of cervical region: Secondary | ICD-10-CM | POA: Diagnosis not present

## 2022-10-17 DIAGNOSIS — S335XXA Sprain of ligaments of lumbar spine, initial encounter: Secondary | ICD-10-CM | POA: Diagnosis not present

## 2022-10-23 ENCOUNTER — Other Ambulatory Visit: Payer: Self-pay | Admitting: Physician Assistant

## 2022-10-23 ENCOUNTER — Ambulatory Visit
Admission: RE | Admit: 2022-10-23 | Discharge: 2022-10-23 | Disposition: A | Discharge status: Home / Self Care | Payer: PPO | Source: Ambulatory Visit | Attending: Physician Assistant | Admitting: Physician Assistant

## 2022-10-23 DIAGNOSIS — Z1231 Encounter for screening mammogram for malignant neoplasm of breast: Secondary | ICD-10-CM

## 2022-10-23 DIAGNOSIS — E782 Mixed hyperlipidemia: Secondary | ICD-10-CM | POA: Diagnosis not present

## 2022-10-23 DIAGNOSIS — R7303 Prediabetes: Secondary | ICD-10-CM | POA: Diagnosis not present

## 2022-10-23 DIAGNOSIS — I10 Essential (primary) hypertension: Secondary | ICD-10-CM | POA: Diagnosis not present

## 2022-10-30 DIAGNOSIS — R42 Dizziness and giddiness: Secondary | ICD-10-CM | POA: Diagnosis not present

## 2022-10-30 DIAGNOSIS — I10 Essential (primary) hypertension: Secondary | ICD-10-CM | POA: Diagnosis not present

## 2022-10-30 DIAGNOSIS — E782 Mixed hyperlipidemia: Secondary | ICD-10-CM | POA: Diagnosis not present

## 2022-10-30 DIAGNOSIS — I4892 Unspecified atrial flutter: Secondary | ICD-10-CM | POA: Diagnosis not present

## 2022-10-30 DIAGNOSIS — I2583 Coronary atherosclerosis due to lipid rich plaque: Secondary | ICD-10-CM | POA: Diagnosis not present

## 2022-10-30 DIAGNOSIS — I5022 Chronic systolic (congestive) heart failure: Secondary | ICD-10-CM | POA: Diagnosis not present

## 2022-10-30 DIAGNOSIS — Z0000 Encounter for general adult medical examination without abnormal findings: Secondary | ICD-10-CM | POA: Diagnosis not present

## 2022-10-30 DIAGNOSIS — I2510 Atherosclerotic heart disease of native coronary artery without angina pectoris: Secondary | ICD-10-CM | POA: Diagnosis not present

## 2022-10-30 DIAGNOSIS — C8510 Unspecified B-cell lymphoma, unspecified site: Secondary | ICD-10-CM | POA: Diagnosis not present

## 2022-10-30 DIAGNOSIS — D071 Carcinoma in situ of vulva: Secondary | ICD-10-CM | POA: Diagnosis not present

## 2022-10-30 DIAGNOSIS — R7303 Prediabetes: Secondary | ICD-10-CM | POA: Diagnosis not present

## 2022-10-30 DIAGNOSIS — D329 Benign neoplasm of meninges, unspecified: Secondary | ICD-10-CM | POA: Diagnosis not present

## 2022-11-26 ENCOUNTER — Ambulatory Visit: Payer: PPO | Admitting: Podiatry

## 2022-11-26 ENCOUNTER — Encounter: Payer: Self-pay | Admitting: Podiatry

## 2022-11-26 ENCOUNTER — Ambulatory Visit (INDEPENDENT_AMBULATORY_CARE_PROVIDER_SITE_OTHER): Payer: PPO

## 2022-11-26 VITALS — BP 125/79 | HR 79

## 2022-11-26 DIAGNOSIS — M7751 Other enthesopathy of right foot: Secondary | ICD-10-CM | POA: Diagnosis not present

## 2022-11-26 DIAGNOSIS — M6740 Ganglion, unspecified site: Secondary | ICD-10-CM | POA: Diagnosis not present

## 2022-11-26 MED ORDER — BETAMETHASONE SOD PHOS & ACET 6 (3-3) MG/ML IJ SUSP
3.0000 mg | Freq: Once | INTRAMUSCULAR | Status: AC
Start: 2022-11-26 — End: 2022-11-26
  Administered 2022-11-26: 3 mg via INTRA_ARTICULAR

## 2022-11-26 NOTE — Progress Notes (Signed)
Chief Complaint  Patient presents with   Foot Pain    "I got a knot on the bottom of my foot." N - knot L - 2nd met right D - couple of weeks O - gradually worse C - sore A - walking, barefoot T - none   HPI: 77 y.o. female presenting today for new complaint of pain and tenderness associated to the right forefoot ongoing for few weeks now.  Idiopathic onset.  She has noticed some swelling to the area and was concerned for possible cyst.  She says that she has a knot on the bottom of her foot.  Minimally symptomatic only when she goes barefoot around the house  Past Medical History:  Diagnosis Date   Anxiety    CAD (coronary artery disease)    a. 11/2015 MV: mod mid-distal ant ischemia, EF 58% w/ apical HK;  b. 11/2015 Cath: LM nl, LAD 80/167m (fills via collats from RPDA & D1), D1 60ost, LCX small, nl, RCA 50p/m, RPDA nl, RPAV min irregs, RPl1/2/3 min irregs, EF 50-55%.   Cancer of brain [facility]) 03/2017   Dental bridge present    bilateral top.  Permanent retainer - bottom.   Dysuria    History of kidney stones    Hyperlipemia    Hypertension    Hypertensive heart disease    IBS (irritable bowel syndrome)    Ischemic cardiomyopathy    a. 11/2015 V gram: EF 50-55%; b. 09/2020 Echo: EF 35-40%, ant, antsept, apical HK. Gr1 DD. Nl RV fxn. Mild MR.   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 Still smoking ~ 1 cigarette/day.   Urethral caruncle    Vertigo    Vulvar adhesions 10/06/2013   perianal skin bridge noted at posterior fourchette and region of WLE surgical site   Vulvar leukoplakia    Vulvar pain     Past Surgical History:  Procedure Laterality Date   ABDOMINAL HERNIA REPAIR  2014   Dr. Renda Rolls   ABDOMINAL HYSTERECTOMY  1978   APPENDECTOMY      BRAIN SURGERY     CARDIAC CATHETERIZATION Left 12/07/2015   Procedure: Left Heart Cath and Coronary Angiography;  Surgeon: Iran Ouch, MD;  Location: [facility] INVASIVE CV LAB;  Service: Cardiovascular;  Laterality: Left;   CATARACT EXTRACTION W/PHACO Right 05/28/2018   Procedure: CATARACT EXTRACTION PHACO AND INTRAOCULAR LENS PLACEMENT (IOC) RIGHT;  Surgeon: Elliot Cousin, MD;  Location: [facility] ORS;  Service: Ophthalmology;  Laterality: Right;  [place] 00:56 CDE 7.17 Fluid pack Lot # [id number]   CATARACT EXTRACTION W/PHACO Left 01/06/2019   Procedure: CATARACT EXTRACTION PHACO AND INTRAOCULAR LENS PLACEMENT (IOC) LEFT  00:50.3  19.3%  9.71;  Surgeon: Lockie Mola, MD;  Location: [facility] SURGERY [facility];  Service: Ophthalmology;  Laterality: Left;   CERVICAL BIOPSY     Dr. Greggory Keen   CHOLECYSTECTOMY     COLONOSCOPY WITH PROPOFOL N/A 02/09/2019   Procedure: COLONOSCOPY WITH PROPOFOL;  Surgeon: Christena Deem, MD;  Location: [facility] ENDOSCOPY;  Service: Endoscopy;  Laterality: N/A;   ESOPHAGOGASTRODUODENOSCOPY (EGD) WITH PROPOFOL N/A 02/09/2019   Procedure: ESOPHAGOGASTRODUODENOSCOPY (EGD) WITH PROPOFOL;  Surgeon: Christena Deem, MD;  Location: [facility] ENDOSCOPY;  Service: Endoscopy;  Laterality: N/A;   MOHS SURGERY Right    Leg   RIGHT/LEFT HEART CATH AND CORONARY ANGIOGRAPHY N/A 10/23/2020   Procedure: RIGHT/LEFT HEART CATH AND CORONARY ANGIOGRAPHY;  Surgeon: Iran Ouch, MD;  Location: [facility] INVASIVE CV LAB;  Service: Cardiovascular;  Laterality: N/A;   SKIN BIOPSY     wide local excision  07/2013   vin 3 w/ margins involoved    Allergies  Allergen Reactions   Tape Rash    bruises   Codeine Nausea And Vomiting   Demerol [Meperidine] Other (See Comments)    Heavy sedation   Erythromycin Nausea Only    "All Mycins cause nausea"   Iodinated Contrast Media Nausea And Vomiting   Other Other (See Comments)   Penicillins Nausea Only    Can do amoxicillin Has patient had a PCN  reaction causing immediate rash, facial/tongue/throat swelling, SOB or lightheadedness with hypotension: Yes Has patient had a PCN reaction causing severe rash involving mucus membranes or skin necrosis: No Has patient had a PCN reaction that required hospitalization No Has patient had a PCN reaction occurring within the last 10 years: No If all of the above answers are "NO", then may proceed with Cephalosporin use.    Entresto [Sacubitril-Valsartan] Rash     Physical Exam: General: The patient is alert and oriented x3 in no acute distress.  Dermatology: Skin is warm, dry and supple bilateral lower extremities.   Vascular: Palpable pedal pulses bilaterally. Capillary refill within normal limits.  No erythema.  Neurological: Grossly intact via light touch  Musculoskeletal Exam: No pedal deformities noted.  There is some tenderness with palpation around the second metatarsal head of the right foot.  Mild associated soft tissue swelling to the area as well  Radiographic Exam RT foot 11/26/2022:  Normal osseous mineralization. Joint spaces preserved.  No fractures or osseous irregularities noted.  Impression: Negative  Assessment/Plan of Care: 1.  Second MTP capsulitis right  -Patient evaluated.  X-rays reviewed -Injection of 0.5 cc Celestone Soluspan injected into the second MTP of the right foot -Advised against going barefoot.  Recommend good supportive tennis shoes even around the house -Recommend resting her foot -Return to clinic as needed       Felecia Shelling, DPM [facility]  Dr. Felecia Shelling, DPM    [address] [place], [place] [address]                Office [phone]  Fax [phone]

## 2022-11-27 ENCOUNTER — Other Ambulatory Visit: Payer: Self-pay | Admitting: Cardiovascular Disease

## 2022-11-27 DIAGNOSIS — E785 Hyperlipidemia, unspecified: Secondary | ICD-10-CM

## 2022-12-03 DIAGNOSIS — Z87891 Personal history of nicotine dependence: Secondary | ICD-10-CM | POA: Diagnosis not present

## 2022-12-03 DIAGNOSIS — D692 Other nonthrombocytopenic purpura: Secondary | ICD-10-CM | POA: Diagnosis not present

## 2022-12-03 DIAGNOSIS — I10 Essential (primary) hypertension: Secondary | ICD-10-CM | POA: Diagnosis not present

## 2022-12-03 DIAGNOSIS — Z6827 Body mass index (BMI) 27.0-27.9, adult: Secondary | ICD-10-CM | POA: Diagnosis not present

## 2022-12-03 DIAGNOSIS — R059 Cough, unspecified: Secondary | ICD-10-CM | POA: Diagnosis not present

## 2022-12-12 ENCOUNTER — Ambulatory Visit
Admission: RE | Admit: 2022-12-12 | Discharge: 2022-12-12 | Disposition: A | Discharge status: Home / Self Care | Payer: PPO | Source: Ambulatory Visit | Attending: Nurse Practitioner | Admitting: Nurse Practitioner

## 2022-12-12 DIAGNOSIS — Z8572 Personal history of non-Hodgkin lymphomas: Secondary | ICD-10-CM | POA: Diagnosis not present

## 2022-12-12 DIAGNOSIS — G96198 Other disorders of meninges, not elsewhere classified: Secondary | ICD-10-CM | POA: Diagnosis not present

## 2022-12-12 MED ORDER — GADOBUTROL 1 MMOL/ML IV SOLN
7.0000 mL | Freq: Once | INTRAVENOUS | Status: AC | PRN
  Administered 2022-12-12: 7 mL via INTRAVENOUS

## 2022-12-14 DIAGNOSIS — L299 Pruritus, unspecified: Secondary | ICD-10-CM | POA: Diagnosis not present

## 2022-12-15 DIAGNOSIS — L299 Pruritus, unspecified: Secondary | ICD-10-CM | POA: Diagnosis not present

## 2022-12-16 ENCOUNTER — Encounter: Payer: Self-pay | Admitting: Oncology

## 2022-12-16 ENCOUNTER — Inpatient Hospital Stay: Payer: PPO | Attending: Oncology | Admitting: Oncology

## 2022-12-16 VITALS — BP 134/62 | HR 77 | Temp 97.5°F | Resp 18 | Wt 154.1 lb

## 2022-12-16 DIAGNOSIS — Z8572 Personal history of non-Hodgkin lymphomas: Secondary | ICD-10-CM | POA: Diagnosis not present

## 2022-12-16 DIAGNOSIS — Z87891 Personal history of nicotine dependence: Secondary | ICD-10-CM

## 2022-12-16 DIAGNOSIS — R202 Paresthesia of skin: Secondary | ICD-10-CM | POA: Insufficient documentation

## 2022-12-16 DIAGNOSIS — F17210 Nicotine dependence, cigarettes, uncomplicated: Secondary | ICD-10-CM | POA: Diagnosis not present

## 2022-12-16 DIAGNOSIS — Z79899 Other long term (current) drug therapy: Secondary | ICD-10-CM | POA: Diagnosis not present

## 2022-12-16 DIAGNOSIS — Z9049 Acquired absence of other specified parts of digestive tract: Secondary | ICD-10-CM | POA: Insufficient documentation

## 2022-12-16 DIAGNOSIS — K589 Irritable bowel syndrome without diarrhea: Secondary | ICD-10-CM | POA: Insufficient documentation

## 2022-12-16 DIAGNOSIS — Z881 Allergy status to other antibiotic agents status: Secondary | ICD-10-CM | POA: Insufficient documentation

## 2022-12-16 DIAGNOSIS — Z818 Family history of other mental and behavioral disorders: Secondary | ICD-10-CM | POA: Diagnosis not present

## 2022-12-16 DIAGNOSIS — Z808 Family history of malignant neoplasm of other organs or systems: Secondary | ICD-10-CM | POA: Diagnosis not present

## 2022-12-16 DIAGNOSIS — I255 Ischemic cardiomyopathy: Secondary | ICD-10-CM | POA: Diagnosis not present

## 2022-12-16 DIAGNOSIS — Z720 Tobacco use: Secondary | ICD-10-CM

## 2022-12-16 DIAGNOSIS — C884 Extranodal marginal zone B-cell lymphoma of mucosa-associated lymphoid tissue [MALT-lymphoma]: Secondary | ICD-10-CM | POA: Insufficient documentation

## 2022-12-16 DIAGNOSIS — Z885 Allergy status to narcotic agent status: Secondary | ICD-10-CM | POA: Diagnosis not present

## 2022-12-16 DIAGNOSIS — Z7901 Long term (current) use of anticoagulants: Secondary | ICD-10-CM | POA: Insufficient documentation

## 2022-12-16 DIAGNOSIS — R531 Weakness: Secondary | ICD-10-CM | POA: Insufficient documentation

## 2022-12-16 DIAGNOSIS — Z880 Allergy status to penicillin: Secondary | ICD-10-CM | POA: Diagnosis not present

## 2022-12-16 DIAGNOSIS — Z8349 Family history of other endocrine, nutritional and metabolic diseases: Secondary | ICD-10-CM | POA: Insufficient documentation

## 2022-12-16 DIAGNOSIS — Z83518 Family history of other specified eye disorder: Secondary | ICD-10-CM | POA: Diagnosis not present

## 2022-12-16 DIAGNOSIS — Z8249 Family history of ischemic heart disease and other diseases of the circulatory system: Secondary | ICD-10-CM | POA: Diagnosis not present

## 2022-12-16 DIAGNOSIS — R0602 Shortness of breath: Secondary | ICD-10-CM | POA: Diagnosis not present

## 2022-12-16 DIAGNOSIS — Z87442 Personal history of urinary calculi: Secondary | ICD-10-CM | POA: Diagnosis not present

## 2022-12-16 DIAGNOSIS — C8510 Unspecified B-cell lymphoma, unspecified site: Secondary | ICD-10-CM

## 2022-12-16 DIAGNOSIS — Z85828 Personal history of other malignant neoplasm of skin: Secondary | ICD-10-CM | POA: Insufficient documentation

## 2022-12-16 DIAGNOSIS — R21 Rash and other nonspecific skin eruption: Secondary | ICD-10-CM | POA: Diagnosis not present

## 2022-12-16 DIAGNOSIS — Z90710 Acquired absence of both cervix and uterus: Secondary | ICD-10-CM | POA: Insufficient documentation

## 2022-12-16 DIAGNOSIS — I252 Old myocardial infarction: Secondary | ICD-10-CM | POA: Insufficient documentation

## 2022-12-16 DIAGNOSIS — R200 Anesthesia of skin: Secondary | ICD-10-CM | POA: Insufficient documentation

## 2022-12-16 NOTE — Progress Notes (Signed)
Pt reports that she started having a Rash to chest  and legs after she had MRI. She is currently on Prednisone for this.

## 2022-12-16 NOTE — Progress Notes (Signed)
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 No evidence of recurrence of lymphoma. Stable counts.  5 year post lymphoma surgery, she is cured.  Follow up PRN  Personal history of tobacco use, presenting hazards to health Overdue for lung cancer screening.  Refer to lung cancer screening   Orders Placed This Encounter  Procedures   Ambulatory Referral for Lung Cancer Screening    Referral Priority:   Routine    Referral Type:   Consultation    Referral Reason:   Specialty Services Required    Number of Visits Requested:   1   Follow up PRN All questions were answered. The patient knows to call the clinic with any problems, questions or concerns.  Rickard Patience, MD, PhD [facility] Hematology Oncology 12/16/2022   HISTORY OF PRESENTING ILLNESS:  Susan Mcmillan is a  77 y.o.  female with PMH listed below who was referred to me for evaluation of lymphoma  Patient previously followed up with [facility] oncology. Extensive medical record, image, pathology report review via care everywhere were performed by me. Patient initially presented with intermittent right arm/neck tingling, weakness dating back to 2016.  Initial brain imaging demonstrated a left side dural based lesion which was not most consistent with meningioma.  This was monitored.  In the fall 2018, these tingling episodes worsened in frequency and severity, going down on her trunk into her right leg.  Patient saw her primary care physician who performed another MRI which showed  larger dural based lesion.  Patient was evaluated by Dr. Adriana Simas who took her to the OR on 03/27/2017 where she underwent a subtotal resection.  She tolerated surgery well. 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 No evidence of MALT1 (18q21) rearrangement   further staging included a PET scan and the bone marrow biopsy were negative. Patient finished adjuvant radiation in February 2019.  She followed up with [facility] oncology Dr. Laural Benes who recommends surveillance with MRI.  Patient was last seen by Dr. Laural Benes on 12/04/2017 and at that time she was recommended to repeat MRI in 4 months and follow-up in the clinic. Patient reports that he was never called for an appointment lost follow-up since then.  Patient reports doing well currently.  Not taking any antiseizure medications.  Patient was seen by primary care physician and would like to be referred to cancer center for further management. She reports that chronic right side numbness and tingling/and weakness has been better since his surgery however has not returned to her baseline.  She needs help opening the cap of her water bottle. She reports that sometimes she does not have steady gait.  Otherwise no new complaints. Denies any headache, shortness of breath, cough, unintentional weight loss, fever or chills, night sweats.  She was accompanied by husband today.  She tells me that Reports chronic history of IBS with weight fluctuation.  #Cancer screening, patient smokes couple of cigarettes a day, longstanding smoking history since age of 62.  Has been  with lung cancer clinic.   #She drinks 8 ounces of Budweiser every afternoon.  Current someday smoker, a few cigarettes daily. 27.5 pack year smoking    04/02/2018 bilateral screening mammogram-BI-RADS 1 12/04/2017 MRI brain with and without contrast Equivalent and  nonspecific increased cranial bone enhancement at the craniotomy site compared to 07/31/2017.  Small left frontal subdural collection subjacent to craniotomy site has almost completely resolved.  Stable small subdural enhancement subjacent to the craniotomy site.  No obvious new intra-axial abnormality. 07/31/2017 MRI with and without contrast 01/17/2017 MRI brain with and without contrast Interval resection of the dural based enhancing mass on the left frontal lobe, residual same linear dural enhancement seen along the left anterior and parietal lobes, could represent residual lymphoma.  Contacted postsurgical changes with small subdural collection along the left frontal lobe. Extra axial enhancing mass overlying the left hemisphere measuring up to 12 mm in thickness compatible with meningioma.  This shows significant enlargement since 2016.  There is mass-effect on the overlying cortex but no shift of the midline structures.  No brain edema.  08/31/2018 MRI brain with and without contrast showed status post resection of extra axial left convexity tumor without residual lesion.  Otherwise normal brain MRI.  Smokes daily, 2 puff at each time, " 1 cigaret lasts the whole day".    INTERVAL HISTORY Susan Mcmillan is a 77 y.o. female who has above history reviewed by me today presents for follow up visit for history of CNS lymphoma. Problems and complaints are listed below: She reports doing well at baseline.  Overdue to CT lung cancer screening.  Chronic SOB with exertion. Come congestion recently. No fever or cough. Congestion started after she did some clean up work in her garage.  She has chronic balancing issue which is at baseline. She has developed skin rash over her chest abdomen lower extremities. She was prescribed prednisone which has helped her symptoms.     Review of Systems  Constitutional:  Negative for appetite change, chills, fatigue and fever.  HENT:   Negative for hearing loss and  voice change.   Eyes:  Negative for eye problems.  Respiratory:  Positive for shortness of breath. Negative for chest tightness and cough.   Cardiovascular:  Negative for chest pain.  Gastrointestinal:  Negative for abdominal distention, abdominal pain and blood in stool.  Endocrine: Negative for hot flashes.  Genitourinary:  Negative for difficulty urinating and frequency.   Musculoskeletal:  Negative for arthralgias.       Chronic right-sided weakness/tingling tremor  Skin:  Negative for itching and rash.  Neurological:  Negative for extremity weakness, headaches, seizures and speech difficulty.  Hematological:  Negative for adenopathy.  Psychiatric/Behavioral:  Negative for confusion.     MEDICAL HISTORY:  Past Medical History:  Diagnosis Date   Anxiety    CAD (coronary artery disease)    a. 11/2015 MV: mod mid-distal ant ischemia, EF 58% w/ apical HK;  b. 11/2015 Cath: LM nl, LAD 80/1100m (fills via collats from RPDA & D1), D1 60ost, LCX small, nl, RCA 50p/m, RPDA nl, RPAV min irregs, RPl1/2/3 min irregs, EF 50-55%.   Cancer of brain [facility]) 03/2017   Dental bridge present    bilateral top.  Permanent retainer - bottom.   Dysuria    History of kidney stones    Hyperlipemia    Hypertension    Hypertensive heart disease    IBS (irritable bowel syndrome)    Ischemic cardiomyopathy    a.  11/2015 V gram: EF 50-55%; b. 09/2020 Echo: EF 35-40%, ant, antsept, apical HK. Gr1 DD. Nl RV fxn. Mild MR.   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 Still smoking ~ 1 cigarette/day.   Urethral caruncle    Vertigo    Vulvar adhesions 10/06/2013   perianal skin bridge noted at posterior fourchette and region of WLE surgical site   Vulvar leukoplakia    Vulvar pain      SURGICAL HISTORY: Past Surgical History:  Procedure Laterality Date   ABDOMINAL HERNIA REPAIR  2014   Dr. Renda Rolls   ABDOMINAL HYSTERECTOMY  1978   APPENDECTOMY     BRAIN SURGERY     CARDIAC CATHETERIZATION Left 12/07/2015   Procedure: Left Heart Cath and Coronary Angiography;  Surgeon: Iran Ouch, MD;  Location: [facility] INVASIVE CV LAB;  Service: Cardiovascular;  Laterality: Left;   CATARACT EXTRACTION W/PHACO Right 05/28/2018   Procedure: CATARACT EXTRACTION PHACO AND INTRAOCULAR LENS PLACEMENT (IOC) RIGHT;  Surgeon: Elliot Cousin, MD;  Location: [facility] ORS;  Service: Ophthalmology;  Laterality: Right;  [place] 00:56 CDE 7.17 Fluid pack Lot # [id number]   CATARACT EXTRACTION W/PHACO Left 01/06/2019   Procedure: CATARACT EXTRACTION PHACO AND INTRAOCULAR LENS PLACEMENT (IOC) LEFT  00:50.3  19.3%  9.71;  Surgeon: Lockie Mola, MD;  Location: [facility];  Service: Ophthalmology;  Laterality: Left;   CERVICAL BIOPSY     Dr. Greggory Keen   CHOLECYSTECTOMY     COLONOSCOPY WITH PROPOFOL N/A 02/09/2019   Procedure: COLONOSCOPY WITH PROPOFOL;  Surgeon: Christena Deem, MD;  Location: [facility] ENDOSCOPY;  Service: Endoscopy;  Laterality: N/A;   ESOPHAGOGASTRODUODENOSCOPY (EGD) WITH PROPOFOL N/A 02/09/2019   Procedure: ESOPHAGOGASTRODUODENOSCOPY (EGD) WITH PROPOFOL;  Surgeon: Christena Deem, MD;  Location: [facility] ENDOSCOPY;  Service: Endoscopy;  Laterality: N/A;   MOHS SURGERY Right    Leg   RIGHT/LEFT HEART CATH AND CORONARY ANGIOGRAPHY N/A 10/23/2020   Procedure: RIGHT/LEFT HEART CATH AND CORONARY ANGIOGRAPHY;  Surgeon: Iran Ouch, MD;  Location: [facility] INVASIVE CV LAB;  Service: Cardiovascular;  Laterality: N/A;   SKIN BIOPSY     wide local excision  07/2013   vin 3 w/ margins involoved    SOCIAL HISTORY: Social History   Socioeconomic History   Marital status: Married    Spouse name: Actor   Number of children: 2   Years of education: Not on file   Highest  education level: Not on file  Occupational History   Occupation: retired  Tobacco Use   Smoking status: Some Days    Current packs/day: 0.00    Average packs/day: 0.3 packs/day for 55.0 years (13.8 ttl pk-yrs)    Types: Cigarettes    Start date: 12/06/1960    Last attempt to quit: 12/07/2015    Years since quitting: 7.0   Smokeless tobacco: Never   Tobacco comments:    smokes about 2 cigarettes a day  Vaping Use   Vaping status: Never Used  Substance and Sexual Activity   Alcohol use: Yes    Alcohol/week: 1.0 standard drink of alcohol    Types: 1 Cans of beer per week    Comment: 8 oz Bud Light with supper daily  Drug use: No   Sexual activity: Not Currently    Birth control/protection: Surgical  Other Topics Concern   Not on file  Social History Narrative   Lives in [place]. Previously worked [employer]. Husband with dementia. Children 2 sons.   Social Determinants of Health   Financial Resource Strain: Not on file  Food Insecurity: Not on file  Transportation Needs: Not on file  Physical Activity: Not on file  Stress: Not on file  Social Connections: Not on file  Intimate Partner Violence: Not on file    FAMILY HISTORY: Family History  Problem Relation Age of Onset   Congestive Heart Failure Mother    Bone cancer Father    Cancer Father        blood cancer   Hyperlipidemia Sister    Hypertension Sister    Macular degeneration Sister    Dementia Sister    Diabetes Neg Hx    Heart disease Neg Hx    Breast cancer Neg Hx     ALLERGIES:  is allergic to tape, codeine, demerol [meperidine], erythromycin, iodinated contrast media, other, penicillins, and entresto [sacubitril-valsartan].  MEDICATIONS:  Current Outpatient Medications  Medication Sig Dispense Refill   ALPRAZolam (XANAX) 0.25 MG tablet Take by mouth.     apixaban (ELIQUIS) 5 MG TABS tablet Take 1 tablet (5 mg total) by mouth 2 (two) times daily. 180 tablet 3   atorvastatin (LIPITOR) 40 MG tablet  Take 1 tablet (40 mg total) by mouth daily. 90 tablet 0   Cyanocobalamin (VITAMIN B-12) 5000 MCG SUBL Place 5,000 mcg under the tongue daily.     diphenhydrAMINE (BENADRYL) 25 MG tablet Take 25 mg by mouth at bedtime.     FLUoxetine (PROZAC) 20 MG capsule Take 20 mg by mouth daily.     meclizine (ANTIVERT) 25 MG tablet Take 1 tablet by mouth 3 (three) times daily as needed.     metoprolol succinate (TOPROL-XL) 25 MG 24 hr tablet TAKE 1 TABLET(25 MG) BY MOUTH DAILY (Patient taking differently: 12.5 mg daily. TAKE 1 TABLET(25 MG) BY MOUTH DAILY) 90 tablet 3   NONFORMULARY OR COMPOUNDED ITEM Apply 1 application  topically 2 (two) times daily as needed (cancer spots). FLUOROURACIL 5% + CALCIPOTRIENE 0.005%     pantoprazole (PROTONIX) 40 MG tablet Take 40 mg by mouth daily.     prednisoLONE acetate (PRED FORTE) 1 % ophthalmic suspension Right eye     predniSONE (DELTASONE) 20 MG tablet Take 20 mg by mouth daily with breakfast.     hyoscyamine (LEVSIN SL) 0.125 MG SL tablet Place 0.125 mg under the tongue every 6 (six) hours as needed. Place 1 tablet under the tongue every 6 hours as needed for cramping for up to 10 days (Patient not taking: Reported on 12/16/2022)     No current facility-administered medications for this visit.     PHYSICAL EXAMINATION: ECOG PERFORMANCE STATUS: 1 - Symptomatic but completely ambulatory Vitals:   12/16/22 1436  BP: 134/62  Pulse: 77  Resp: 18  Temp: (!) 97.5 F (36.4 C)  SpO2: 93%   Filed Weights   12/16/22 1436  Weight: 154 lb 1.6 oz (69.9 kg)    Physical Exam Constitutional:      General: She is not in acute distress. HENT:     Head: Normocephalic and atraumatic.  Eyes:     General: No scleral icterus.    Pupils: Pupils are equal, round, and reactive to light.  Cardiovascular:     Rate and  Rhythm: Normal rate and regular rhythm.     Heart sounds: Normal heart sounds.  Pulmonary:     Effort: Pulmonary effort is normal. No respiratory distress.      Breath sounds: No wheezing.     Comments: Decreased breath sound bilaterally Abdominal:     General: Bowel sounds are normal. There is no distension.     Palpations: Abdomen is soft. There is no mass.     Tenderness: There is no abdominal tenderness.  Musculoskeletal:        General: No deformity. Normal range of motion.     Cervical back: Normal range of motion and neck supple.  Skin:    General: Skin is warm and dry.     Findings: Bruising and rash present. No erythema.  Neurological:     Mental Status: She is alert and oriented to person, place, and time. Mental status is at baseline.     Cranial Nerves: No cranial nerve deficit.     Coordination: Coordination normal.  Psychiatric:        Mood and Affect: Mood normal.     RADIOGRAPHIC STUDIES: I have personally reviewed the radiological images as listed and agreed with the findings in the report.    Latest Ref Rng & Units 01/22/2022    4:39 PM 12/04/2021    2:19 PM 11/28/2020    1:56 PM  CBC  WBC 4.0 - 10.5 K/uL 7.4  6.5  6.3   Hemoglobin 12.0 - 15.0 g/dL 16.1  09.6  04.5   Hematocrit 36.0 - 46.0 % 43.2  44.1  43.3   Platelets 150 - 400 K/uL 339  279  271       Latest Ref Rng & Units 01/22/2022    4:39 PM 12/04/2021    2:19 PM 11/28/2020    1:56 PM  CMP  Glucose 70 - 99 mg/dL 91  99  89   BUN 8 - 23 mg/dL 14  15  16    Creatinine 0.44 - 1.00 mg/dL 4.09  8.11  9.14   Sodium 135 - 145 mmol/L 139  138  136   Potassium 3.5 - 5.1 mmol/L 3.8  4.2  4.2   Chloride 98 - 111 mmol/L 103  109  98   CO2 22 - 32 mmol/L 26  26  26    Calcium 8.9 - 10.3 mg/dL 9.6  8.9  9.5   Total Protein 6.5 - 8.1 g/dL  7.2  7.0   Total Bilirubin 0.3 - 1.2 mg/dL  0.4  0.6   Alkaline Phos 38 - 126 U/L  82  91   AST 15 - 41 U/L  22  21   ALT 0 - 44 U/L  19  19     LABORATORY DATA:  I have reviewed the data as listed Lab Results  Component Value Date   WBC 7.4 01/22/2022   HGB 14.2 01/22/2022   HCT 43.2 01/22/2022   MCV 88.0 01/22/2022   PLT  339 01/22/2022      Latest Ref Rng & Units 01/22/2022    4:39 PM 12/04/2021    2:19 PM 11/28/2020    1:56 PM  CMP  Glucose 70 - 99 mg/dL 91  99  89   BUN 8 - 23 mg/dL 14  15  16    Creatinine 0.44 - 1.00 mg/dL 7.82  9.56  2.13   Sodium 135 - 145 mmol/L 139  138  136   Potassium 3.5 - 5.1 mmol/L  3.8  4.2  4.2   Chloride 98 - 111 mmol/L 103  109  98   CO2 22 - 32 mmol/L 26  26  26    Calcium 8.9 - 10.3 mg/dL 9.6  8.9  9.5   Total Protein 6.5 - 8.1 g/dL  7.2  7.0   Total Bilirubin 0.3 - 1.2 mg/dL  0.4  0.6   Alkaline Phos 38 - 126 U/L  82  91   AST 15 - 41 U/L  22  21   ALT 0 - 44 U/L  19  19       RADIOGRAPHIC STUDIES: I have personally reviewed the radiological images as listed and agreed with the findings in the report. MR Brain W Wo Contrast  Result Date: 12/13/2022 CLINICAL DATA:  Hematologic malignancy, assess treatment response history of lymphoma, left convexity dural based tumor resection; restaging EXAM: MRI HEAD WITHOUT AND WITH CONTRAST TECHNIQUE: Multiplanar, multiecho pulse sequences of the brain and surrounding structures were obtained without and with intravenous contrast. CONTRAST:  7mL GADAVIST GADOBUTROL 1 MMOL/ML IV SOLN COMPARISON:  MRI head 12/04/2021. FINDINGS: Brain: No acute infarction, hemorrhage, hydrocephalus, extra-axial collection or mass lesion. No pathologic enhancement. Similar smooth dural enhancement along the left cerebral convexity. Vascular: Major arterial flow voids are maintained. Skull and upper cervical spine: Prior left craniotomy. Sinuses/Orbits: Clear sinuses.  No acute orbital findings. IMPRESSION: Similar chronic dural thickening left convexity. Otherwise, normal brain MRI. Electronically Signed   By: Feliberto Harts M.D.   On: 12/13/2022 15:08   DG Foot Complete Right  Result Date: 11/26/2022 Please see detailed radiograph report in office note.  MM 3D SCREENING MAMMOGRAM BILATERAL BREAST  Result Date: 10/25/2022 CLINICAL DATA:  Screening.  EXAM: DIGITAL SCREENING BILATERAL MAMMOGRAM WITH TOMOSYNTHESIS AND CAD TECHNIQUE: Bilateral screening digital craniocaudal and mediolateral oblique mammograms were obtained. Bilateral screening digital breast tomosynthesis was performed. The images were evaluated with computer-aided detection. COMPARISON:  Previous exam(s). ACR Breast Density Category b: There are scattered areas of fibroglandular density. FINDINGS: There are no findings suspicious for malignancy. IMPRESSION: No mammographic evidence of malignancy. A result letter of this screening mammogram will be mailed directly to the patient. RECOMMENDATION: Screening mammogram in one year. (Code:SM-B-01Y) BI-RADS CATEGORY  1: Negative. Electronically Signed   By: Amie Portland M.D.   On: 10/25/2022 09:57

## 2022-12-16 NOTE — Assessment & Plan Note (Signed)
Overdue for lung cancer screening.  Refer to lung cancer screening

## 2022-12-16 NOTE — Assessment & Plan Note (Signed)
History of dural lymphoma Surveillance brain MRI was independently reviewed by me and discussed with patient. No evidence of recurrence of lymphoma. Stable counts.  5 year post lymphoma surgery, she is cured.  Follow up PRN

## 2022-12-17 DIAGNOSIS — M5416 Radiculopathy, lumbar region: Secondary | ICD-10-CM | POA: Diagnosis not present

## 2022-12-17 DIAGNOSIS — L299 Pruritus, unspecified: Secondary | ICD-10-CM | POA: Diagnosis not present

## 2022-12-17 DIAGNOSIS — M48062 Spinal stenosis, lumbar region with neurogenic claudication: Secondary | ICD-10-CM | POA: Diagnosis not present

## 2023-01-03 DIAGNOSIS — Z961 Presence of intraocular lens: Secondary | ICD-10-CM | POA: Diagnosis not present

## 2023-01-03 DIAGNOSIS — H43813 Vitreous degeneration, bilateral: Secondary | ICD-10-CM | POA: Diagnosis not present

## 2023-01-03 DIAGNOSIS — B0233 Zoster keratitis: Secondary | ICD-10-CM | POA: Diagnosis not present

## 2023-01-03 DIAGNOSIS — H18523 Epithelial (juvenile) corneal dystrophy, bilateral: Secondary | ICD-10-CM | POA: Diagnosis not present

## 2023-01-07 DIAGNOSIS — M7062 Trochanteric bursitis, left hip: Secondary | ICD-10-CM | POA: Diagnosis not present

## 2023-01-07 DIAGNOSIS — M7061 Trochanteric bursitis, right hip: Secondary | ICD-10-CM | POA: Diagnosis not present

## 2023-01-14 ENCOUNTER — Other Ambulatory Visit: Payer: Self-pay | Admitting: *Deleted

## 2023-01-14 DIAGNOSIS — F17210 Nicotine dependence, cigarettes, uncomplicated: Secondary | ICD-10-CM

## 2023-01-14 DIAGNOSIS — Z122 Encounter for screening for malignant neoplasm of respiratory organs: Secondary | ICD-10-CM

## 2023-01-14 DIAGNOSIS — Z87891 Personal history of nicotine dependence: Secondary | ICD-10-CM

## 2023-01-16 ENCOUNTER — Other Ambulatory Visit: Payer: Self-pay

## 2023-01-16 DIAGNOSIS — Z8572 Personal history of non-Hodgkin lymphomas: Secondary | ICD-10-CM

## 2023-01-16 DIAGNOSIS — R42 Dizziness and giddiness: Secondary | ICD-10-CM

## 2023-01-21 ENCOUNTER — Ambulatory Visit
Admission: RE | Admit: 2023-01-21 | Discharge: 2023-01-21 | Disposition: A | Discharge status: Home / Self Care | Payer: PPO | Source: Ambulatory Visit | Attending: Acute Care | Admitting: Acute Care

## 2023-01-21 DIAGNOSIS — F17210 Nicotine dependence, cigarettes, uncomplicated: Secondary | ICD-10-CM | POA: Diagnosis not present

## 2023-01-21 DIAGNOSIS — Z122 Encounter for screening for malignant neoplasm of respiratory organs: Secondary | ICD-10-CM

## 2023-01-21 DIAGNOSIS — Z87891 Personal history of nicotine dependence: Secondary | ICD-10-CM | POA: Diagnosis not present

## 2023-01-22 DIAGNOSIS — Z85828 Personal history of other malignant neoplasm of skin: Secondary | ICD-10-CM | POA: Diagnosis not present

## 2023-01-22 DIAGNOSIS — L821 Other seborrheic keratosis: Secondary | ICD-10-CM | POA: Diagnosis not present

## 2023-01-22 DIAGNOSIS — L814 Other melanin hyperpigmentation: Secondary | ICD-10-CM | POA: Diagnosis not present

## 2023-01-22 DIAGNOSIS — D492 Neoplasm of unspecified behavior of bone, soft tissue, and skin: Secondary | ICD-10-CM | POA: Diagnosis not present

## 2023-01-22 DIAGNOSIS — L858 Other specified epidermal thickening: Secondary | ICD-10-CM | POA: Diagnosis not present

## 2023-01-22 DIAGNOSIS — L570 Actinic keratosis: Secondary | ICD-10-CM | POA: Diagnosis not present

## 2023-01-22 DIAGNOSIS — D229 Melanocytic nevi, unspecified: Secondary | ICD-10-CM | POA: Diagnosis not present

## 2023-01-22 DIAGNOSIS — L578 Other skin changes due to chronic exposure to nonionizing radiation: Secondary | ICD-10-CM | POA: Diagnosis not present

## 2023-02-04 ENCOUNTER — Other Ambulatory Visit: Payer: Self-pay

## 2023-02-04 DIAGNOSIS — F17210 Nicotine dependence, cigarettes, uncomplicated: Secondary | ICD-10-CM

## 2023-02-04 DIAGNOSIS — Z87891 Personal history of nicotine dependence: Secondary | ICD-10-CM

## 2023-02-04 DIAGNOSIS — Z122 Encounter for screening for malignant neoplasm of respiratory organs: Secondary | ICD-10-CM

## 2023-02-07 ENCOUNTER — Inpatient Hospital Stay: Payer: PPO | Admitting: Internal Medicine

## 2023-02-23 ENCOUNTER — Other Ambulatory Visit: Payer: Self-pay | Admitting: Cardiovascular Disease

## 2023-02-23 DIAGNOSIS — E785 Hyperlipidemia, unspecified: Secondary | ICD-10-CM

## 2023-03-14 ENCOUNTER — Encounter: Payer: Self-pay | Admitting: Internal Medicine

## 2023-03-14 ENCOUNTER — Inpatient Hospital Stay: Payer: PPO | Attending: Oncology | Admitting: Internal Medicine

## 2023-03-14 VITALS — BP 142/72 | HR 69 | Temp 96.1°F | Ht 62.0 in | Wt 157.3 lb

## 2023-03-14 DIAGNOSIS — C8510 Unspecified B-cell lymphoma, unspecified site: Secondary | ICD-10-CM

## 2023-03-14 DIAGNOSIS — R42 Dizziness and giddiness: Secondary | ICD-10-CM | POA: Insufficient documentation

## 2023-03-14 DIAGNOSIS — Z880 Allergy status to penicillin: Secondary | ICD-10-CM | POA: Insufficient documentation

## 2023-03-14 DIAGNOSIS — Z885 Allergy status to narcotic agent status: Secondary | ICD-10-CM | POA: Insufficient documentation

## 2023-03-14 DIAGNOSIS — C884 Extranodal marginal zone b-cell lymphoma of mucosa-associated lymphoid tissue (malt-lymphoma) not having achieved remission: Secondary | ICD-10-CM | POA: Insufficient documentation

## 2023-03-14 DIAGNOSIS — Z881 Allergy status to other antibiotic agents status: Secondary | ICD-10-CM | POA: Diagnosis not present

## 2023-03-14 DIAGNOSIS — Z85828 Personal history of other malignant neoplasm of skin: Secondary | ICD-10-CM | POA: Diagnosis not present

## 2023-03-14 DIAGNOSIS — Z79899 Other long term (current) drug therapy: Secondary | ICD-10-CM | POA: Diagnosis not present

## 2023-03-14 DIAGNOSIS — Z9049 Acquired absence of other specified parts of digestive tract: Secondary | ICD-10-CM | POA: Insufficient documentation

## 2023-03-14 DIAGNOSIS — Z90710 Acquired absence of both cervix and uterus: Secondary | ICD-10-CM | POA: Diagnosis not present

## 2023-03-14 DIAGNOSIS — I255 Ischemic cardiomyopathy: Secondary | ICD-10-CM | POA: Insufficient documentation

## 2023-03-14 DIAGNOSIS — Z808 Family history of malignant neoplasm of other organs or systems: Secondary | ICD-10-CM | POA: Insufficient documentation

## 2023-03-14 DIAGNOSIS — Z83438 Family history of other disorder of lipoprotein metabolism and other lipidemia: Secondary | ICD-10-CM | POA: Diagnosis not present

## 2023-03-14 DIAGNOSIS — Z8249 Family history of ischemic heart disease and other diseases of the circulatory system: Secondary | ICD-10-CM | POA: Diagnosis not present

## 2023-03-14 DIAGNOSIS — I252 Old myocardial infarction: Secondary | ICD-10-CM | POA: Insufficient documentation

## 2023-03-14 DIAGNOSIS — Z7901 Long term (current) use of anticoagulants: Secondary | ICD-10-CM | POA: Insufficient documentation

## 2023-03-14 DIAGNOSIS — E785 Hyperlipidemia, unspecified: Secondary | ICD-10-CM | POA: Diagnosis not present

## 2023-03-14 DIAGNOSIS — Z87442 Personal history of urinary calculi: Secondary | ICD-10-CM | POA: Diagnosis not present

## 2023-03-14 DIAGNOSIS — Z818 Family history of other mental and behavioral disorders: Secondary | ICD-10-CM | POA: Insufficient documentation

## 2023-03-14 DIAGNOSIS — I2510 Atherosclerotic heart disease of native coronary artery without angina pectoris: Secondary | ICD-10-CM | POA: Diagnosis not present

## 2023-03-14 DIAGNOSIS — R202 Paresthesia of skin: Secondary | ICD-10-CM | POA: Diagnosis not present

## 2023-03-14 DIAGNOSIS — F17210 Nicotine dependence, cigarettes, uncomplicated: Secondary | ICD-10-CM | POA: Diagnosis not present

## 2023-03-14 DIAGNOSIS — Z8349 Family history of other endocrine, nutritional and metabolic diseases: Secondary | ICD-10-CM | POA: Diagnosis not present

## 2023-03-14 DIAGNOSIS — R531 Weakness: Secondary | ICD-10-CM | POA: Diagnosis not present

## 2023-03-14 NOTE — Progress Notes (Signed)
C/o having no balance. If she raises her arm above her head, she gets dizzy. She states she is feeling hot/dizzy and having some nausea today.Has had a MRI of her brain.

## 2023-03-14 NOTE — Progress Notes (Signed)
[facility] [address]  [place], [place] [address] [phone]   New Patient Evaluation  Date of Service: 03/14/23 Patient Name: Susan Mcmillan Patient MRN: [id number] Patient DOB: 11-03-1945 Provider: Henreitta Leber, MD  Identifying Statement:  Susan Mcmillan is a 77 y.o. female with  dural based   B-cell lymphoma  who presents for initial consultation and evaluation.    Referring Provider: Rickard Patience, MD [address] 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 Brain imaging revealed a left dural based lesion.   03/27/2017 Surgery  Subtotal resection by Dr. Adriana Simas  03/27/2017 Initial Diagnosis  Mature B-cell lymphoma  06/04/2017 - 06/20/2017 Radiation  2340 cGy in 17 fractions; Dr. Amada Jupiter   Biomarkers:  MGMT Unknown.  IDH 1/2 Unknown.  EGFR Unknown  TERT Unknown   History of Present Illness: The patient's records from the referring physician were obtained and reviewed and the patient interviewed to confirm this HPI.  Susan Mcmillan presents today for follow up after recent MRI for her CNS marginal lymphoma.  She acknowledges ongoing mild right leg weakness and difficulty with gait.  Dizziness and vertigo is a problem at times, in particular when she bends forward or lays back in the bed.  She has meclizine from her PCP but this hasn't helped much.  Otherwise no new or progressive changes.  Medications: Current Outpatient Medications on File Prior to Visit  Medication Sig Dispense Refill   ALPRAZolam (XANAX) 0.25 MG tablet Take by mouth.     apixaban (ELIQUIS) 5 MG TABS tablet Take 1 tablet (5 mg total) by mouth 2 (two) times daily. 180 tablet 3   atorvastatin (LIPITOR) 40 MG tablet TAKE 1 TABLET(40 MG) BY MOUTH DAILY (Patient taking differently: Take 80 mg by mouth daily.) 90 tablet 0   Cyanocobalamin  (VITAMIN B-12) 5000 MCG SUBL Place 5,000 mcg under the tongue daily.     diphenhydrAMINE (BENADRYL) 25 MG tablet Take 25 mg by mouth at bedtime.     FLUoxetine (PROZAC) 20 MG capsule Take 20 mg by mouth daily.     hyoscyamine (LEVSIN SL) 0.125 MG SL tablet Place 0.125 mg under the tongue every 6 (six) hours as needed. Place 1 tablet under the tongue every 6 hours as needed for cramping for up to 10 days     meclizine (ANTIVERT) 25 MG tablet Take 1 tablet by mouth 3 (three) times daily as needed.     metoprolol succinate (TOPROL-XL) 25 MG 24 hr tablet TAKE 1 TABLET(25 MG) BY MOUTH DAILY (Patient taking differently: 25 mg daily.) 90 tablet 3   NONFORMULARY OR COMPOUNDED ITEM Apply 1 application  topically 2 (two) times daily as needed (cancer spots). FLUOROURACIL 5% + CALCIPOTRIENE 0.005%     pantoprazole (PROTONIX) 40 MG tablet Take 40 mg by mouth daily.     prednisoLONE acetate (PRED FORTE) 1 % ophthalmic suspension Right eye     No current facility-administered medications on file prior to visit.    Allergies:  Allergies  Allergen Reactions   Tape Rash    bruises   Codeine Nausea And Vomiting   Demerol [Meperidine] Other (See Comments)    Heavy sedation   Erythromycin Nausea Only    "All Mycins cause nausea"   Iodinated Contrast Media Nausea And Vomiting   Other Other (See Comments)   Penicillins Nausea Only  Can do amoxicillin Has patient had a PCN reaction causing immediate rash, facial/tongue/throat swelling, SOB or lightheadedness with hypotension: Yes Has patient had a PCN reaction causing severe rash involving mucus membranes or skin necrosis: No Has patient had a PCN reaction that required hospitalization No Has patient had a PCN reaction occurring within the last 10 years: No If all of the above answers are "NO", then may proceed with Cephalosporin use.    Entresto [Sacubitril-Valsartan] Rash   Past Medical History:  Past Medical History:  Diagnosis Date   Anxiety     CAD (coronary artery disease)    a. 11/2015 MV: mod mid-distal ant ischemia, EF 58% w/ apical HK;  b. 11/2015 Cath: LM nl, LAD 80/151m (fills via collats from RPDA & D1), D1 60ost, LCX small, nl, RCA 50p/m, RPDA nl, RPAV min irregs, RPl1/2/3 min irregs, EF 50-55%.   Cancer [facility] brain [facility]) 03/2017   Dental bridge present    bilateral top.  Permanent retainer - bottom.   Dysuria    History of kidney stones    Hyperlipemia    Hypertension    Hypertensive heart disease    IBS (irritable bowel syndrome)    Ischemic cardiomyopathy    a. 11/2015 V gram: EF 50-55%; b. 09/2020 Echo: EF 35-40%, ant, antsept, apical HK. Gr1 DD. Nl RV fxn. Mild MR.   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 Still smoking ~ 1 cigarette/day.   Urethral caruncle    Vertigo    Vulvar adhesions 10/06/2013   perianal skin bridge noted at posterior fourchette and region of WLE surgical site   Vulvar leukoplakia    Vulvar pain    Past Surgical History:  Past Surgical History:  Procedure Laterality Date   ABDOMINAL HERNIA REPAIR  2014   Dr. Renda Rolls   ABDOMINAL HYSTERECTOMY  1978   APPENDECTOMY     BRAIN SURGERY     CARDIAC CATHETERIZATION Left 12/07/2015   Procedure: Left Heart Cath and Coronary Angiography;  Surgeon: Iran Ouch, MD;  Location: [facility] INVASIVE CV LAB;  Service: Cardiovascular;  Laterality: Left;   CATARACT EXTRACTION W/PHACO Right 05/28/2018   Procedure: CATARACT EXTRACTION PHACO AND INTRAOCULAR LENS PLACEMENT (IOC) RIGHT;  Surgeon: Elliot Cousin, MD;  Location: [facility] ORS;  Service: Ophthalmology;  Laterality: Right;  [place] 00:56 CDE 7.17 Fluid pack Lot # [id number]   CATARACT EXTRACTION W/PHACO Left 01/06/2019   Procedure: CATARACT EXTRACTION PHACO AND INTRAOCULAR LENS  PLACEMENT (IOC) LEFT  00:50.3  19.3%  9.71;  Surgeon: Lockie Mola, MD;  Location: [facility];  Service: Ophthalmology;  Laterality: Left;   CERVICAL BIOPSY     Dr. Greggory Keen   CHOLECYSTECTOMY     COLONOSCOPY WITH PROPOFOL N/A 02/09/2019   Procedure: COLONOSCOPY WITH PROPOFOL;  Surgeon: Christena Deem, MD;  Location: [facility] ENDOSCOPY;  Service: Endoscopy;  Laterality: N/A;   ESOPHAGOGASTRODUODENOSCOPY (EGD) WITH PROPOFOL N/A 02/09/2019   Procedure: ESOPHAGOGASTRODUODENOSCOPY (EGD) WITH PROPOFOL;  Surgeon: Christena Deem, MD;  Location: [facility]COPY;  Service: Endoscopy;  Laterality: N/A;   MOHS SURGERY Right    Leg   RIGHT/LEFT HEART CATH AND CORONARY ANGIOGRAPHY N/A 10/23/2020   Procedure: RIGHT/LEFT HEART CATH AND CORONARY ANGIOGRAPHY;  Surgeon: Iran Ouch, MD;  Location: [facility] INVASIVE CV LAB;  Service: Cardiovascular;  Laterality: N/A;   SKIN BIOPSY     wide local excision  07/2013   vin 3 w/ margins involoved   Social History:  Social History   Socioeconomic History   Marital status: Married    Spouse name: Actor   Number of children: 2   Years of education: Not on file   Highest education level: Not on file  Occupational History   Occupation: retired  Tobacco Use   Smoking status: Some Days    Current packs/day: 0.00    Average packs/day: 0.3 packs/day for 55.0 years (13.8 ttl pk-yrs)    Types: Cigarettes    Start date: 12/06/1960    Last attempt to quit: 12/07/2015    Years since quitting: 7.2   Smokeless tobacco: Never   Tobacco comments:    smokes about 2 cigarettes a day  Vaping Use   Vaping status: Never Used  Substance and Sexual Activity   Alcohol use: Yes    Alcohol/week: 1.0 standard drink of alcohol    Types: 1 Cans of beer per week    Comment: 8 oz Bud Light with supper daily   Drug use: No   Sexual activity: Not Currently    Birth control/protection: Surgical  Other Topics Concern   Not on file  Social History Narrative    Lives in [place]. Previously worked [employer]. Husband with dementia. Children 2 sons.   Social Determinants of Health   Financial Resource Strain: Not on file  Food Insecurity: Not on file  Transportation Needs: Not on file  Physical Activity: Not on file  Stress: Not on file  Social Connections: Not on file  Intimate Partner Violence: Not on file   Family History:  Family History  Problem Relation Age of Onset   Congestive Heart Failure Mother    Bone cancer Father    Cancer Father        blood cancer   Hyperlipidemia Sister    Hypertension Sister    Macular degeneration Sister    Dementia Sister    Diabetes Neg Hx    Heart disease Neg Hx    Breast cancer Neg Hx     Review of Systems: Constitutional: Doesn't report fevers, chills or abnormal weight loss Eyes: Doesn't report blurriness of vision Ears, nose, mouth, throat, and face: Doesn't report sore throat Respiratory: Doesn't report cough, dyspnea or wheezes Cardiovascular: Doesn't report palpitation, chest discomfort  Gastrointestinal:  Doesn't report nausea, constipation, diarrhea GU: Doesn't report incontinence Skin: Doesn't report skin rashes Neurological: Per HPI Musculoskeletal: Doesn't report joint pain Behavioral/Psych: Doesn't report anxiety  Physical Exam: Vitals:   03/14/23 1051  BP: (!) 142/72  Pulse: 69  Temp: (!) 96.1 F (35.6 C)  SpO2: 97%   KPS: 80. General: Alert, cooperative, pleasant, in no acute distress Head: Normal EENT: No conjunctival injection or scleral icterus.  Lungs: Resp effort normal Cardiac: Regular rate Abdomen: Non-distended abdomen Skin: No rashes cyanosis or petechiae. Extremities: No clubbing or edema  Neurologic Exam: Mental Status: Awake, alert, attentive to examiner. Oriented to self and environment. Language is fluent with intact comprehension.  Cranial Nerves: Visual acuity is grossly normal. Visual fields are full. Extra-ocular movements intact. No  ptosis. Face is symmetric Motor: Tone and bulk are normal. Power is 4+/5 in right leg. Reflexes are symmetric, no pathologic reflexes present.  Sensory: Intact to light touch Gait: Hemiparetic   Labs: I have reviewed  the data as listed    Component Value Date/Time   NA 139 01/22/2022 1639   NA 141 10/13/2020 1553   NA 136 07/27/2013 1511   K 3.8 01/22/2022 1639   K 3.6 07/27/2013 1511   CL 103 01/22/2022 1639   CL 102 07/27/2013 1511   CO2 26 01/22/2022 1639   CO2 30 07/27/2013 1511   GLUCOSE 91 01/22/2022 1639   GLUCOSE 87 07/27/2013 1511   BUN 14 01/22/2022 1639   BUN 12 10/13/2020 1553   BUN 13 07/27/2013 1511   CREATININE 0.73 01/22/2022 1639   CREATININE 0.77 05/27/2014 1600   CALCIUM 9.6 01/22/2022 1639   CALCIUM 9.3 07/27/2013 1511   PROT 7.2 12/04/2021 1419   PROT 6.7 02/06/2016 0953   ALBUMIN 3.9 12/04/2021 1419   ALBUMIN 4.2 02/06/2016 0953   AST 22 12/04/2021 1419   ALT 19 12/04/2021 1419   ALKPHOS 82 12/04/2021 1419   BILITOT 0.4 12/04/2021 1419   BILITOT 0.5 02/06/2016 0953   GFRNONAA >60 01/22/2022 1639   GFRNONAA >60 07/27/2013 1511   GFRAA >60 12/01/2019 1302   GFRAA >60 07/27/2013 1511   Lab Results  Component Value Date   WBC 7.4 01/22/2022   NEUTROABS 5.2 01/22/2022   HGB 14.2 01/22/2022   HCT 43.2 01/22/2022   MCV 88.0 01/22/2022   PLT 339 01/22/2022    Imaging: [facility] Clinician Interpretation: I have personally reviewed the CNS images as listed.  My interpretation, in the context of the patient's clinical presentation, is stable disease  CLINICAL DATA:  Hematologic malignancy, assess treatment response history of lymphoma, left convexity dural based tumor resection; restaging   EXAM: MRI HEAD WITHOUT AND WITH CONTRAST   TECHNIQUE: Multiplanar, multiecho pulse sequences of the brain and surrounding structures were obtained without and with intravenous contrast.   CONTRAST:  7mL GADAVIST GADOBUTROL 1 MMOL/ML IV SOLN   COMPARISON:   MRI head 12/04/2021.   FINDINGS: Brain: No acute infarction, hemorrhage, hydrocephalus, extra-axial collection or mass lesion. No pathologic enhancement. Similar smooth dural enhancement along the left cerebral convexity.   Vascular: Major arterial flow voids are maintained.   Skull and upper cervical spine: Prior left craniotomy.   Sinuses/Orbits: Clear sinuses.  No acute orbital findings.   IMPRESSION: Similar chronic dural thickening left convexity. Otherwise, normal brain MRI.     Electronically Signed   By: Feliberto Harts M.D.   On: 12/13/2022 15:08   Pathology: A-B. Brain tumor, biopsies:   Mature B-cell lymphoma with plasmacytic differentiation. See comment.   Comment: Given the extensive plasmacytic differentiation and dural involvement, we favor a marginal zone lymphoma (MZL), but there are scattered lymphoid follicles within the tissue that are not clearly reactive.  We cannot exclude a follicular lymphoma with plasmacytic differentiation or a composite lymphoma (MZL and follicular lymphoma).  We have ordered FISH for the t(14;18) translocation seen in over 80% of follicular lymphomas, and if possible, will also evaluate the most common translocation seen in extranodal marginal zone lymphomas.  Results to follow in a separate report from cytogenetics, and will be integrated in a future addendum to this case.     The associated flow cytometry ([id number]) shows a kappa-restricted B-cell population and increased plasma cells, consistent with the morphologic findings.   Dr. Ashley Jacobs of the Division of Hematopathology reviewed this case and concurs. Dr. Ilean China of the Division of Neuropathology reviewed this case and concurs that there is no evidence of meningioma.   Assessment/Plan B-cell lymphoma,  unspecified B-cell lymphoma type, unspecified body region [facility])  We appreciate the opportunity to participate in the care of CADEN FATICA.   She is  clinically stable today, with baseline right leg weakness and gait dysfunction.  She is not interested in physical  therapy at this time.   MRI brain demonstrates stable findings.   We discussed ENT referral for perpiheral vertigo, vestibular testing.  She will think about this.  Ok with continuing prn meclizine 25mg .  We spent twenty additional minutes teaching regarding the natural history, biology, and historical experience in the treatment of brain tumors. We then discussed in detail the current recommendations for therapy focusing on the mode of administration, mechanism of action, anticipated toxicities, and quality of life issues associated with this plan. We also provided teaching sheets for the patient to take home as an additional resource.  We ask that JULIEANNA GERACI return to clinic in 9 months following next brain MRI, or sooner as needed.  All questions were answered. The patient knows to call the clinic with any problems, questions or concerns. No barriers to learning were detected.  The total time spent in the encounter was 40 minutes and more than 50% was on counseling and review of test results   Henreitta Leber, MD Medical Director of Neuro-Oncology [facility] 03/14/23 10:58 AM

## 2023-03-21 DIAGNOSIS — M48062 Spinal stenosis, lumbar region with neurogenic claudication: Secondary | ICD-10-CM | POA: Diagnosis not present

## 2023-03-21 DIAGNOSIS — M5416 Radiculopathy, lumbar region: Secondary | ICD-10-CM | POA: Diagnosis not present

## 2023-04-01 ENCOUNTER — Encounter: Payer: Self-pay | Admitting: Cardiovascular Disease

## 2023-04-01 ENCOUNTER — Ambulatory Visit: Payer: PPO | Attending: Cardiovascular Disease | Admitting: Cardiovascular Disease

## 2023-04-01 VITALS — BP 136/76 | HR 55 | Ht 62.0 in | Wt 159.1 lb

## 2023-04-01 DIAGNOSIS — I5022 Chronic systolic (congestive) heart failure: Secondary | ICD-10-CM

## 2023-04-01 DIAGNOSIS — R42 Dizziness and giddiness: Secondary | ICD-10-CM

## 2023-04-01 DIAGNOSIS — I10 Essential (primary) hypertension: Secondary | ICD-10-CM | POA: Diagnosis not present

## 2023-04-01 DIAGNOSIS — I4892 Unspecified atrial flutter: Secondary | ICD-10-CM

## 2023-04-01 DIAGNOSIS — I2510 Atherosclerotic heart disease of native coronary artery without angina pectoris: Secondary | ICD-10-CM

## 2023-04-01 DIAGNOSIS — E785 Hyperlipidemia, unspecified: Secondary | ICD-10-CM | POA: Diagnosis not present

## 2023-04-01 DIAGNOSIS — I70208 Unspecified atherosclerosis of native arteries of extremities, other extremity: Secondary | ICD-10-CM | POA: Diagnosis not present

## 2023-04-01 MED ORDER — APIXABAN 5 MG PO TABS: 5.0000 mg | ORAL_TABLET | Freq: Two times a day (BID) | ORAL | 3 refills | Status: DC

## 2023-04-01 NOTE — Patient Instructions (Signed)

## 2023-04-01 NOTE — Progress Notes (Signed)
Cardiology Office Note   Date:  04/01/2023   ID:  Susan Mcmillan, Susan Mcmillan 09/29/1945, MRN [id number]  PCP:  Patrice Paradise, MD  Cardiologist:   Lorine Bears, MD   Chief Complaint  Patient presents with   Follow-up    6 month f/u c/o dizziness when reaching kitchen cabinets. Meds reviewed verbally with pt.       History of Present Illness: Susan Mcmillan is a 77 y.o. female who is here today for a follow-up visit regarding coronary artery disease, chronic diastolic heart failure and atrial flutter. 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  She has phonically occluded mid LAD with collaterals since at least 2017.  She had worsening heart failure in 2022.   Repeat echocardiogram in May of 2022 showed decreased ejection fraction to 35 to 40% with grade 1 diastolic dysfunction and mild mitral regurgitation.  Right and left cardiac catheterization was performed in June of 2022 which showed no significant change since 2017 with chronically occluded mid LAD with collaterals, moderate proximal stenosis in first diagonal and moderate RCA disease.  Right heart catheterization showed normal right and left-sided filling pressures, minimal pulmonary hypertension and severely reduced cardiac output.  She developed a rash and hypotension with Entresto.  Advancing heart failure medications have been limited by low blood pressure.    Jardiance was associated with UTIs and thus was discontinued.  She was seen in August 2023 due to dizziness and near syncope.  Outpatient monitor showed intermittent episodes of atrial flutter.  Echocardiogram in September 2023 showed an EF of 40 to 45%.  She had symptomatic hypotension that required stopping losartan.  She has been stable from a  cardiac standpoint with no chest pain or worsening dyspnea.  She continues to have issues with vertigo and has an ENT appointment scheduled in the near future.  Past Medical History:  Diagnosis Date   Anxiety    CAD (coronary artery disease)    a. 11/2015 MV: mod mid-distal ant ischemia, EF 58% w/ apical HK;  b. 11/2015 Cath: LM nl, LAD 80/179m (fills via collats from RPDA & D1), D1 60ost, LCX small, nl, RCA 50p/m, RPDA nl, RPAV min irregs, RPl1/2/3 min irregs, EF 50-55%.   Cancer of brain [facility]) 03/2017   Dental bridge present    bilateral top.  Permanent retainer - bottom.   Dysuria    History of kidney stones    Hyperlipemia    Hypertension    Hypertensive heart disease    IBS (irritable bowel syndrome)    Ischemic cardiomyopathy    a. 11/2015 V gram: EF 50-55%; b. 09/2020 Echo: EF 35-40%, ant, antsept, apical HK. Gr1 DD. Nl RV fxn. Mild MR.   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 Still smoking ~ 1 cigarette/day.   Urethral caruncle    Vertigo    Vulvar adhesions 10/06/2013   perianal skin bridge noted at posterior fourchette and region of WLE surgical site   Vulvar leukoplakia    Vulvar pain  Past Surgical History:  Procedure Laterality Date   ABDOMINAL HERNIA REPAIR  2014   Dr. Renda Rolls   ABDOMINAL HYSTERECTOMY  1978   APPENDECTOMY     BRAIN SURGERY     CARDIAC CATHETERIZATION Left 12/07/2015   Procedure: Left Heart Cath and Coronary Angiography;  Surgeon: Iran Ouch, MD;  Location: [facility] INVASIVE CV LAB;  Service: Cardiovascular;  Laterality: Left;   CATARACT EXTRACTION W/PHACO Right 05/28/2018   Procedure: CATARACT EXTRACTION PHACO AND INTRAOCULAR LENS PLACEMENT (IOC) RIGHT;  Surgeon: Elliot Cousin, MD;  Location: [facility] ORS;  Service:  Ophthalmology;  Laterality: Right;  [place] 00:56 CDE 7.17 Fluid pack Lot # [id number]   CATARACT EXTRACTION W/PHACO Left 01/06/2019   Procedure: CATARACT EXTRACTION PHACO AND INTRAOCULAR LENS PLACEMENT (IOC) LEFT  00:50.3  19.3%  9.71;  Surgeon: Lockie Mola, MD;  Location: [facility] SURGERY [facility]TR;  Service: Ophthalmology;  Laterality: Left;   CERVICAL BIOPSY     Dr. Greggory Keen   CHOLECYSTECTOMY     COLONOSCOPY WITH PROPOFOL N/A 02/09/2019   Procedure: COLONOSCOPY WITH PROPOFOL;  Surgeon: Christena Deem, MD;  Location: [facility] ENDOSCOPY;  Service: Endoscopy;  Laterality: N/A;   ESOPHAGOGASTRODUODENOSCOPY (EGD) WITH PROPOFOL N/A 02/09/2019   Procedure: ESOPHAGOGASTRODUODENOSCOPY (EGD) WITH PROPOFOL;  Surgeon: Christena Deem, MD;  Location: [facility] ENDOSCOPY;  Service: Endoscopy;  Laterality: N/A;   MOHS SURGERY Right    Leg   RIGHT/LEFT HEART CATH AND CORONARY ANGIOGRAPHY N/A 10/23/2020   Procedure: RIGHT/LEFT HEART CATH AND CORONARY ANGIOGRAPHY;  Surgeon: Iran Ouch, MD;  Location: [facility] INVASIVE CV LAB;  Service: Cardiovascular;  Laterality: N/A;   SKIN BIOPSY     wide local excision  07/2013   vin 3 w/ margins involoved     Current Outpatient Medications  Medication Sig Dispense Refill   ALPRAZolam (XANAX) 0.25 MG tablet Take by mouth.     apixaban (ELIQUIS) 5 MG TABS tablet Take 1 tablet (5 mg total) by mouth 2 (two) times daily. 180 tablet 3   atorvastatin (LIPITOR) 80 MG tablet Take 80 mg by mouth daily.     Cyanocobalamin (VITAMIN B-12) 5000 MCG SUBL Place 5,000 mcg under the tongue daily.     diphenhydrAMINE (BENADRYL) 25 MG tablet Take 25 mg by mouth at bedtime.     FLUoxetine (PROZAC) 20 MG capsule Take 20 mg by mouth daily.     hyoscyamine (LEVSIN SL) 0.125 MG SL tablet Place 0.125 mg under the tongue every 6 (six) hours as needed. Place 1 tablet under the tongue every 6 hours as needed for cramping for up to 10 days     meclizine (ANTIVERT) 25 MG tablet Take 1 tablet by  mouth 3 (three) times daily as needed.     metoprolol succinate (TOPROL-XL) 25 MG 24 hr tablet TAKE 1 TABLET(25 MG) BY MOUTH DAILY (Patient taking differently: 25 mg daily.) 90 tablet 3   NONFORMULARY OR COMPOUNDED ITEM Apply 1 application  topically 2 (two) times daily as needed (cancer spots). FLUOROURACIL 5% + CALCIPOTRIENE 0.005%     pantoprazole (PROTONIX) 40 MG tablet Take 40 mg by mouth daily.     prednisoLONE acetate (PRED FORTE) 1 % ophthalmic suspension Right eye     No current facility-administered medications for this visit.    Allergies:   Tape, Codeine, Demerol [meperidine], Erythromycin, Iodinated contrast media, Other, Penicillins, and Entresto [sacubitril-valsartan]    Social History:  The patient  reports that she has been smoking cigarettes. She started smoking about 62 years ago.  She has a 13.8 pack-year smoking history. She has never used smokeless tobacco. She reports current alcohol use of about 1.0 standard drink of alcohol per week. She reports that she does not use drugs.   Family History:  The patient's family history includes Bone cancer in her father; Cancer in her father; Congestive Heart Failure in her mother; Dementia in her sister; Hyperlipidemia in her sister; Hypertension in her sister; Macular degeneration in her sister.    ROS:  Please see the history of present illness.   Otherwise, review of systems are positive for none.   All other systems are reviewed and negative.    PHYSICAL EXAM: VS:  BP 136/76 (BP Location: Left Arm, Patient Position: Sitting, Cuff Size: Normal)   Pulse (!) 55   Ht 5\' 2"  (1.575 m)   Wt 159 lb 2 oz (72.2 kg)   SpO2 98%   BMI 29.10 kg/m  , BMI Body mass index is 29.1 kg/m. GEN: Well nourished, well developed, in no acute distress  HEENT: normal  Neck: no JVD, carotid bruits, or masses Cardiac: RRR; no murmurs, rubs, or gallops,no edema  Respiratory:  clear to auscultation bilaterally, normal work of breathing GI: soft,  nontender, nondistended, + BS MS: no deformity or atrophy  Skin: warm and dry, no rash Neuro:  Strength and sensation are intact Psych: euthymic mood, full affect    EKG:  EKG is ordered today. EKG showed: Sinus bradycardia ST & T wave abnormality, consider anterolateral ischemia When compared with ECG of 23-Feb-2019 17:51, Inverted T waves have replaced nonspecific T wave abnormality in Anterior leads    Recent Labs: No results found for requested labs within last 365 days.    Lipid Panel    Component Value Date/Time   CHOL 149 02/06/2016 0953   TRIG 155 (H) 02/06/2016 0953   HDL 54 02/06/2016 0953   CHOLHDL 2.8 02/06/2016 0953   CHOLHDL 4 05/17/2015 1442   VLDL 57.6 (H) 05/17/2015 1442   LDLCALC 64 02/06/2016 0953   LDLDIRECT 88.0 05/17/2015 1442      Wt Readings from Last 3 Encounters:  04/01/23 159 lb 2 oz (72.2 kg)  03/14/23 157 lb 4.8 oz (71.4 kg)  12/16/22 154 lb 1.6 oz (69.9 kg)       ASSESSMENT AND PLAN:  1.  Coronary artery disease involving native coronary arteries without angina:  She has chronically occluded LAD with collaterals and moderate diagonal and RCA disease.  Recommend aggressive medical therapy.  2.  Chronic systolic heart failure: Most recent echocardiogram in September 2023 showed an EF of 40 to 45%.  She appears to be euvolemic.  Continue Toprol.  She is no longer on losartan due to symptomatic hypotension.  She also did not tolerate Jardiance due to frequent infections.  3.  Essential hypertension: Blood pressure is controlled.  4.  Hyperlipidemia: Continue high-dose atorvastatin 80 mg once daily.  Most recent lipid profile in June showed an LDL of 48.  5.  Tobacco use: She continues to smoke few cigarettes a day.  I discussed the importance of smoking cessation.  6.  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  Her right ulnar pulse is normal and currently with no symptoms.  7.  Paroxysmal atrial  flutter: She is maintaining in sinus rhythm with Toprol.  Eliquis was refilled.  8.  Dizziness: Her symptoms are consistent with vertigo.  She is scheduled to see ENT in the near future.  She  is not orthostatic today.   Disposition:   FU with me in 6 months.  Signed,  Lorine Bears, MD  04/01/2023 3:42 PM    [facility]

## 2023-04-07 DIAGNOSIS — H8112 Benign paroxysmal vertigo, left ear: Secondary | ICD-10-CM | POA: Diagnosis not present

## 2023-04-07 DIAGNOSIS — H903 Sensorineural hearing loss, bilateral: Secondary | ICD-10-CM | POA: Diagnosis not present

## 2023-04-15 DIAGNOSIS — L820 Inflamed seborrheic keratosis: Secondary | ICD-10-CM | POA: Diagnosis not present

## 2023-04-15 DIAGNOSIS — D229 Melanocytic nevi, unspecified: Secondary | ICD-10-CM | POA: Diagnosis not present

## 2023-04-15 DIAGNOSIS — L570 Actinic keratosis: Secondary | ICD-10-CM | POA: Diagnosis not present

## 2023-04-15 DIAGNOSIS — L578 Other skin changes due to chronic exposure to nonionizing radiation: Secondary | ICD-10-CM | POA: Diagnosis not present

## 2023-04-15 DIAGNOSIS — Z85828 Personal history of other malignant neoplasm of skin: Secondary | ICD-10-CM | POA: Diagnosis not present

## 2023-04-15 DIAGNOSIS — L720 Epidermal cyst: Secondary | ICD-10-CM | POA: Diagnosis not present

## 2023-04-15 DIAGNOSIS — L814 Other melanin hyperpigmentation: Secondary | ICD-10-CM | POA: Diagnosis not present

## 2023-04-18 DIAGNOSIS — M7061 Trochanteric bursitis, right hip: Secondary | ICD-10-CM | POA: Diagnosis not present

## 2023-04-18 DIAGNOSIS — M7062 Trochanteric bursitis, left hip: Secondary | ICD-10-CM | POA: Diagnosis not present

## 2023-04-21 DIAGNOSIS — H8113 Benign paroxysmal vertigo, bilateral: Secondary | ICD-10-CM | POA: Diagnosis not present

## 2023-04-25 DIAGNOSIS — I110 Hypertensive heart disease with heart failure: Secondary | ICD-10-CM | POA: Diagnosis not present

## 2023-04-25 DIAGNOSIS — I509 Heart failure, unspecified: Secondary | ICD-10-CM | POA: Diagnosis not present

## 2023-04-25 DIAGNOSIS — L03031 Cellulitis of right toe: Secondary | ICD-10-CM | POA: Diagnosis not present

## 2023-04-28 DIAGNOSIS — R7303 Prediabetes: Secondary | ICD-10-CM | POA: Diagnosis not present

## 2023-04-28 DIAGNOSIS — Z0000 Encounter for general adult medical examination without abnormal findings: Secondary | ICD-10-CM | POA: Diagnosis not present

## 2023-04-28 DIAGNOSIS — I10 Essential (primary) hypertension: Secondary | ICD-10-CM | POA: Diagnosis not present

## 2023-04-28 DIAGNOSIS — E782 Mixed hyperlipidemia: Secondary | ICD-10-CM | POA: Diagnosis not present

## 2023-05-02 DIAGNOSIS — I10 Essential (primary) hypertension: Secondary | ICD-10-CM | POA: Diagnosis not present

## 2023-05-02 DIAGNOSIS — L03031 Cellulitis of right toe: Secondary | ICD-10-CM | POA: Diagnosis not present

## 2023-05-05 DIAGNOSIS — I4892 Unspecified atrial flutter: Secondary | ICD-10-CM | POA: Diagnosis not present

## 2023-05-05 DIAGNOSIS — R32 Unspecified urinary incontinence: Secondary | ICD-10-CM | POA: Diagnosis not present

## 2023-05-05 DIAGNOSIS — C8510 Unspecified B-cell lymphoma, unspecified site: Secondary | ICD-10-CM | POA: Diagnosis not present

## 2023-05-05 DIAGNOSIS — N819 Female genital prolapse, unspecified: Secondary | ICD-10-CM | POA: Diagnosis not present

## 2023-05-05 DIAGNOSIS — I10 Essential (primary) hypertension: Secondary | ICD-10-CM | POA: Diagnosis not present

## 2023-05-05 DIAGNOSIS — D329 Benign neoplasm of meninges, unspecified: Secondary | ICD-10-CM | POA: Diagnosis not present

## 2023-05-05 DIAGNOSIS — I2510 Atherosclerotic heart disease of native coronary artery without angina pectoris: Secondary | ICD-10-CM | POA: Diagnosis not present

## 2023-05-05 DIAGNOSIS — R42 Dizziness and giddiness: Secondary | ICD-10-CM | POA: Diagnosis not present

## 2023-05-05 DIAGNOSIS — D071 Carcinoma in situ of vulva: Secondary | ICD-10-CM | POA: Diagnosis not present

## 2023-05-05 DIAGNOSIS — R7303 Prediabetes: Secondary | ICD-10-CM | POA: Diagnosis not present

## 2023-05-05 DIAGNOSIS — Z0000 Encounter for general adult medical examination without abnormal findings: Secondary | ICD-10-CM | POA: Diagnosis not present

## 2023-05-05 DIAGNOSIS — E782 Mixed hyperlipidemia: Secondary | ICD-10-CM | POA: Diagnosis not present

## 2023-05-15 DIAGNOSIS — H18523 Epithelial (juvenile) corneal dystrophy, bilateral: Secondary | ICD-10-CM | POA: Diagnosis not present

## 2023-05-15 DIAGNOSIS — Z961 Presence of intraocular lens: Secondary | ICD-10-CM | POA: Diagnosis not present

## 2023-05-15 DIAGNOSIS — B0233 Zoster keratitis: Secondary | ICD-10-CM | POA: Diagnosis not present

## 2023-05-15 NOTE — Telephone Encounter (Signed)
 Created in error

## 2023-05-20 ENCOUNTER — Encounter: Payer: Self-pay | Admitting: Obstetrics and Gynecology

## 2023-05-24 ENCOUNTER — Other Ambulatory Visit: Payer: Self-pay | Admitting: Cardiovascular Disease

## 2023-05-24 DIAGNOSIS — E785 Hyperlipidemia, unspecified: Secondary | ICD-10-CM

## 2023-05-26 NOTE — Telephone Encounter (Signed)
 Last office visit: 04/01/23 with plan to f/u 6 months. next office visit: none/active recall

## 2023-06-06 ENCOUNTER — Other Ambulatory Visit: Payer: Self-pay | Admitting: Cardiovascular Disease

## 2023-06-06 DIAGNOSIS — H8111 Benign paroxysmal vertigo, right ear: Secondary | ICD-10-CM | POA: Diagnosis not present

## 2023-06-06 DIAGNOSIS — I4892 Unspecified atrial flutter: Secondary | ICD-10-CM

## 2023-06-06 NOTE — Telephone Encounter (Signed)
*  STAT* If patient is at the pharmacy, call can be transferred to refill team.   1. Which medications need to be refilled? (please list name of each medication and dose if known)   apixaban (ELIQUIS) 5 MG TABS tablet   2. Would you like to learn more about the convenience, safety, & potential cost savings by using the [facility]?   3. Are you open to using the [facility] (Type [facility]. ).  4. Which pharmacy/location (including street and city if local pharmacy) is medication to be sent to?  [id number] - [place], [place] - [address]   5. Do they need a 30 day or 90 day supply?   100 day  Patient stated [facility] is [facility] insurance preferred pharmacy and wants to get a 100 day refill which she says will cost the same as a 90 day refill.

## 2023-06-12 DIAGNOSIS — M1612 Unilateral primary osteoarthritis, left hip: Secondary | ICD-10-CM | POA: Diagnosis not present

## 2023-06-12 DIAGNOSIS — M5416 Radiculopathy, lumbar region: Secondary | ICD-10-CM | POA: Diagnosis not present

## 2023-06-12 DIAGNOSIS — M48062 Spinal stenosis, lumbar region with neurogenic claudication: Secondary | ICD-10-CM | POA: Diagnosis not present

## 2023-06-13 MED ORDER — APIXABAN 5 MG PO TABS: 5.0000 mg | ORAL_TABLET | Freq: Two times a day (BID) | ORAL | 1 refills | Status: DC

## 2023-06-13 NOTE — Telephone Encounter (Signed)
Prescription refill request for Eliquis received. Indication: a flutter Last office visit: 04/01/23 Scr: 0.9 epic 04/28/23 Age: 78 Weight: 72kg

## 2023-06-18 DIAGNOSIS — M9904 Segmental and somatic dysfunction of sacral region: Secondary | ICD-10-CM | POA: Diagnosis not present

## 2023-06-18 DIAGNOSIS — M5417 Radiculopathy, lumbosacral region: Secondary | ICD-10-CM | POA: Diagnosis not present

## 2023-06-18 DIAGNOSIS — M9905 Segmental and somatic dysfunction of pelvic region: Secondary | ICD-10-CM | POA: Diagnosis not present

## 2023-06-18 DIAGNOSIS — M9901 Segmental and somatic dysfunction of cervical region: Secondary | ICD-10-CM | POA: Diagnosis not present

## 2023-06-18 DIAGNOSIS — S335XXA Sprain of ligaments of lumbar spine, initial encounter: Secondary | ICD-10-CM | POA: Diagnosis not present

## 2023-06-18 DIAGNOSIS — M9903 Segmental and somatic dysfunction of lumbar region: Secondary | ICD-10-CM | POA: Diagnosis not present

## 2023-06-18 DIAGNOSIS — M9902 Segmental and somatic dysfunction of thoracic region: Secondary | ICD-10-CM | POA: Diagnosis not present

## 2023-06-20 DIAGNOSIS — H8113 Benign paroxysmal vertigo, bilateral: Secondary | ICD-10-CM | POA: Diagnosis not present

## 2023-08-04 DIAGNOSIS — M5416 Radiculopathy, lumbar region: Secondary | ICD-10-CM | POA: Diagnosis not present

## 2023-08-04 DIAGNOSIS — M48062 Spinal stenosis, lumbar region with neurogenic claudication: Secondary | ICD-10-CM | POA: Diagnosis not present

## 2023-08-05 DIAGNOSIS — D485 Neoplasm of uncertain behavior of skin: Secondary | ICD-10-CM | POA: Diagnosis not present

## 2023-08-05 DIAGNOSIS — D229 Melanocytic nevi, unspecified: Secondary | ICD-10-CM | POA: Diagnosis not present

## 2023-08-05 DIAGNOSIS — L821 Other seborrheic keratosis: Secondary | ICD-10-CM | POA: Diagnosis not present

## 2023-08-05 DIAGNOSIS — L570 Actinic keratosis: Secondary | ICD-10-CM | POA: Diagnosis not present

## 2023-08-05 DIAGNOSIS — Z85828 Personal history of other malignant neoplasm of skin: Secondary | ICD-10-CM | POA: Diagnosis not present

## 2023-08-05 DIAGNOSIS — L814 Other melanin hyperpigmentation: Secondary | ICD-10-CM | POA: Diagnosis not present

## 2023-08-12 ENCOUNTER — Encounter: Payer: Self-pay | Admitting: Podiatry

## 2023-08-12 ENCOUNTER — Ambulatory Visit: Admitting: Podiatry

## 2023-08-12 DIAGNOSIS — L97522 Non-pressure chronic ulcer of other part of left foot with fat layer exposed: Secondary | ICD-10-CM

## 2023-08-12 MED ORDER — DOXYCYCLINE HYCLATE 100 MG PO TABS: 100.0000 mg | ORAL_TABLET | Freq: Two times a day (BID) | ORAL | 0 refills | Status: DC

## 2023-08-12 MED ORDER — MUPIROCIN 2 % EX OINT: 1.0000 | TOPICAL_OINTMENT | Freq: Two times a day (BID) | CUTANEOUS | 1 refills | Status: DC

## 2023-08-12 NOTE — Progress Notes (Signed)
 Chief Complaint  Patient presents with   Nail Problem    "Right after Christmas, I woke up and my toe was red as Rudolf's nose.  Part of my toenail has come off now." N - toenail L - hallux right D - 3 weeks O - suddenly C - toenail got loose A - none T - I clipped the loose part all.  Bactroban Ointment, Vaseline    HPI: 78 y.o. female presenting today for evaluation of partial loss of the right hallux nail plate around Christmas time.  She says that she noticed red swelling to the right hallux nail plate.  Idiopathic.  Since that time half of the nail plate has come off.  Patient also states that she has an ulcer to the lateral aspect of the left foot.  She says that at [facility] dermatology they 'burned' a lesion off of the foot.  She has not followed back up with them.  Past Medical History:  Diagnosis Date   Anxiety    CAD (coronary artery disease)    a. 11/2015 MV: mod mid-distal ant ischemia, EF 58% w/ apical HK;  b. 11/2015 Cath: LM nl, LAD 80/17m (fills via collats from RPDA & D1), D1 60ost, LCX small, nl, RCA 50p/m, RPDA nl, RPAV min irregs, RPl1/2/3 min irregs, EF 50-55%.   Cancer of brain [facility]) 03/2017   Dental bridge present    bilateral top.  Permanent retainer - bottom.   Dysuria    History of kidney stones    Hyperlipemia    Hypertension    Hypertensive heart disease    IBS (irritable bowel syndrome)    Ischemic cardiomyopathy    a. 11/2015 V gram: EF 50-55%; b. 09/2020 Echo: EF 35-40%, ant, antsept, apical HK. Gr1 DD. Nl RV fxn. Mild MR.   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 Still smoking ~ 1 cigarette/day.   Urethral caruncle    Vertigo    Vulvar adhesions 10/06/2013   perianal skin bridge noted at posterior fourchette and region of  WLE surgical site   Vulvar leukoplakia    Vulvar pain     Past Surgical History:  Procedure Laterality Date   ABDOMINAL HERNIA REPAIR  2014   Dr. Renda Rolls   ABDOMINAL HYSTERECTOMY  1978   APPENDECTOMY     BRAIN SURGERY     CARDIAC CATHETERIZATION Left 12/07/2015   Procedure: Left Heart Cath and Coronary Angiography;  Surgeon: Iran Ouch, MD;  Location: [facility] INVASIVE CV LAB;  Service: Cardiovascular;  Laterality: Left;   CATARACT EXTRACTION W/PHACO Right 05/28/2018   Procedure: CATARACT EXTRACTION PHACO AND INTRAOCULAR LENS PLACEMENT (IOC) RIGHT;  Surgeon: Elliot Cousin, MD;  Location: [facility] ORS;  Service: Ophthalmology;  Laterality: Right;  [place] 00:56 CDE 7.17 Fluid pack Lot # [id number]   CATARACT EXTRACTION W/PHACO Left 01/06/2019   Procedure: CATARACT EXTRACTION PHACO AND INTRAOCULAR LENS PLACEMENT (IOC) LEFT  00:50.3  19.3%  9.71;  Surgeon: Lockie Mola, MD;  Location: [facility];  Service: Ophthalmology;  Laterality: Left;   CERVICAL BIOPSY     Dr. Greggory Keen   CHOLECYSTECTOMY     COLONOSCOPY WITH PROPOFOL N/A 02/09/2019   Procedure: COLONOSCOPY WITH PROPOFOL;  Surgeon: Christena Deem, MD;  Location: [facility] ENDOSCOPY;  Service: Endoscopy;  Laterality: N/A;   ESOPHAGOGASTRODUODENOSCOPY (EGD) WITH PROPOFOL N/A 02/09/2019   Procedure: ESOPHAGOGASTRODUODENOSCOPY (EGD) WITH PROPOFOL;  Surgeon: Christena Deem, MD;  Location: [facility] ENDOSCOPY;  Service: Endoscopy;  Laterality: N/A;   MOHS SURGERY Right    Leg   RIGHT/LEFT HEART CATH AND CORONARY ANGIOGRAPHY N/A 10/23/2020   Procedure: RIGHT/LEFT HEART CATH AND CORONARY ANGIOGRAPHY;  Surgeon: Iran Ouch, MD;  Location: [facility] INVASIVE CV LAB;  Service: Cardiovascular;  Laterality: N/A;   SKIN BIOPSY     wide local excision  07/2013   vin 3 w/ margins involoved    Allergies  Allergen Reactions   Tape Rash    bruises   Codeine Nausea And Vomiting   Demerol [Meperidine] Other (See Comments)    Heavy  sedation   Erythromycin Nausea Only    "All Mycins cause nausea"   Iodinated Contrast Media Nausea And Vomiting   Other Other (See Comments)   Penicillins Nausea Only    Can do amoxicillin Has patient had a PCN reaction causing immediate rash, facial/tongue/throat swelling, SOB or lightheadedness with hypotension: Yes Has patient had a PCN reaction causing severe rash involving mucus membranes or skin necrosis: No Has patient had a PCN reaction that required hospitalization No Has patient had a PCN reaction occurring within the last 10 years: No If all of the above answers are "NO", then may proceed with Cephalosporin use.    Entresto [Sacubitril-Valsartan] Rash    LT foot 08/12/2023  Physical Exam: General: The patient is alert and oriented x3 in no acute distress.  Dermatology: Skin is warm, dry and supple bilateral lower extremities.  Partial loss of the right hallux nail plate noted with healthy regrowth and healthy base of the nail plate.  Ulcer noted to the lateral aspect of the left foot measuring approximately 1.0 x 1.0 x 0.2 cm.  Fibrotic wound base.  Vascular: Palpable pedal pulses bilaterally.  Varicosities noted bilateral lower extremities.  Neurological: Grossly intact via light touch  Musculoskeletal Exam: No pedal deformities noted   Assessment/Plan of Care: 1.  Partial loss of toenail right hallux nail plate 2.  Ulcer lateral aspect of the left foot  -Patient evaluated -Debridement of the nail plate was performed today using a nail nipper and smoothed with a rotary bur.  Allow the nail to grow out with time -In regards to the ulcer to the lateral aspect of the left foot, she has been managed by [facility] dermatology.  Recommend that she has follow-up with dermatology.  She states that dermatology 'burned' the lesion.  She will make a follow-up appoint with dermatology -In the meantime I did call in a prescription for mupirocin ointment to apply to the wound with a light  dressing daily. -Also prescription for doxycycline 100 mg twice daily x 10 days -Return to clinic with me as needed       Felecia Shelling, DPM [facility]  Dr. Felecia Shelling, DPM    [address] [place], [place] [address]                Office [phone]  Fax [phone]

## 2023-09-04 ENCOUNTER — Encounter: Payer: Self-pay | Admitting: Cardiovascular Disease

## 2023-09-04 NOTE — Telephone Encounter (Signed)
 Error

## 2023-09-08 DIAGNOSIS — M7061 Trochanteric bursitis, right hip: Secondary | ICD-10-CM | POA: Diagnosis not present

## 2023-09-08 DIAGNOSIS — M7062 Trochanteric bursitis, left hip: Secondary | ICD-10-CM | POA: Diagnosis not present

## 2023-09-30 ENCOUNTER — Other Ambulatory Visit: Payer: Self-pay | Admitting: Physician Assistant

## 2023-09-30 DIAGNOSIS — Z1231 Encounter for screening mammogram for malignant neoplasm of breast: Secondary | ICD-10-CM

## 2023-10-27 DIAGNOSIS — E782 Mixed hyperlipidemia: Secondary | ICD-10-CM | POA: Diagnosis not present

## 2023-10-27 DIAGNOSIS — R7303 Prediabetes: Secondary | ICD-10-CM | POA: Diagnosis not present

## 2023-10-27 DIAGNOSIS — I10 Essential (primary) hypertension: Secondary | ICD-10-CM | POA: Diagnosis not present

## 2023-10-28 ENCOUNTER — Ambulatory Visit
Admission: RE | Admit: 2023-10-28 | Discharge: 2023-10-28 | Disposition: A | Discharge status: Home / Self Care | Source: Ambulatory Visit | Attending: Physician Assistant | Admitting: Physician Assistant

## 2023-10-28 DIAGNOSIS — Z1231 Encounter for screening mammogram for malignant neoplasm of breast: Secondary | ICD-10-CM | POA: Diagnosis not present

## 2023-11-03 DIAGNOSIS — Z1331 Encounter for screening for depression: Secondary | ICD-10-CM | POA: Diagnosis not present

## 2023-11-03 DIAGNOSIS — I2583 Coronary atherosclerosis due to lipid rich plaque: Secondary | ICD-10-CM | POA: Diagnosis not present

## 2023-11-03 DIAGNOSIS — Z1231 Encounter for screening mammogram for malignant neoplasm of breast: Secondary | ICD-10-CM | POA: Diagnosis not present

## 2023-11-03 DIAGNOSIS — C8510 Unspecified B-cell lymphoma, unspecified site: Secondary | ICD-10-CM | POA: Diagnosis not present

## 2023-11-03 DIAGNOSIS — D329 Benign neoplasm of meninges, unspecified: Secondary | ICD-10-CM | POA: Diagnosis not present

## 2023-11-03 DIAGNOSIS — Z0000 Encounter for general adult medical examination without abnormal findings: Secondary | ICD-10-CM | POA: Diagnosis not present

## 2023-11-03 DIAGNOSIS — E782 Mixed hyperlipidemia: Secondary | ICD-10-CM | POA: Diagnosis not present

## 2023-11-03 DIAGNOSIS — I2510 Atherosclerotic heart disease of native coronary artery without angina pectoris: Secondary | ICD-10-CM | POA: Diagnosis not present

## 2023-11-03 DIAGNOSIS — R7303 Prediabetes: Secondary | ICD-10-CM | POA: Diagnosis not present

## 2023-11-03 DIAGNOSIS — I10 Essential (primary) hypertension: Secondary | ICD-10-CM | POA: Diagnosis not present

## 2023-11-11 DIAGNOSIS — B0233 Zoster keratitis: Secondary | ICD-10-CM | POA: Diagnosis not present

## 2023-11-11 DIAGNOSIS — H18523 Epithelial (juvenile) corneal dystrophy, bilateral: Secondary | ICD-10-CM | POA: Diagnosis not present

## 2023-11-11 DIAGNOSIS — H04123 Dry eye syndrome of bilateral lacrimal glands: Secondary | ICD-10-CM | POA: Diagnosis not present

## 2023-11-19 ENCOUNTER — Ambulatory Visit: Admitting: Cardiovascular Disease

## 2023-11-24 DIAGNOSIS — M9903 Segmental and somatic dysfunction of lumbar region: Secondary | ICD-10-CM | POA: Diagnosis not present

## 2023-11-24 DIAGNOSIS — S335XXA Sprain of ligaments of lumbar spine, initial encounter: Secondary | ICD-10-CM | POA: Diagnosis not present

## 2023-11-24 DIAGNOSIS — M9904 Segmental and somatic dysfunction of sacral region: Secondary | ICD-10-CM | POA: Diagnosis not present

## 2023-11-24 DIAGNOSIS — M5417 Radiculopathy, lumbosacral region: Secondary | ICD-10-CM | POA: Diagnosis not present

## 2023-11-24 DIAGNOSIS — M9902 Segmental and somatic dysfunction of thoracic region: Secondary | ICD-10-CM | POA: Diagnosis not present

## 2023-11-24 DIAGNOSIS — M9901 Segmental and somatic dysfunction of cervical region: Secondary | ICD-10-CM | POA: Diagnosis not present

## 2023-11-24 DIAGNOSIS — M9905 Segmental and somatic dysfunction of pelvic region: Secondary | ICD-10-CM | POA: Diagnosis not present

## 2023-11-27 ENCOUNTER — Encounter: Payer: Self-pay | Admitting: Physician Assistant

## 2023-11-27 ENCOUNTER — Ambulatory Visit: Attending: Physician Assistant | Admitting: Physician Assistant

## 2023-11-27 VITALS — BP 120/56 | HR 57 | Ht 62.0 in | Wt 152.8 lb

## 2023-11-27 DIAGNOSIS — I10 Essential (primary) hypertension: Secondary | ICD-10-CM

## 2023-11-27 DIAGNOSIS — E785 Hyperlipidemia, unspecified: Secondary | ICD-10-CM | POA: Diagnosis not present

## 2023-11-27 DIAGNOSIS — R42 Dizziness and giddiness: Secondary | ICD-10-CM

## 2023-11-27 DIAGNOSIS — I255 Ischemic cardiomyopathy: Secondary | ICD-10-CM | POA: Diagnosis not present

## 2023-11-27 DIAGNOSIS — I4892 Unspecified atrial flutter: Secondary | ICD-10-CM | POA: Diagnosis not present

## 2023-11-27 DIAGNOSIS — I70208 Unspecified atherosclerosis of native arteries of extremities, other extremity: Secondary | ICD-10-CM

## 2023-11-27 DIAGNOSIS — Z720 Tobacco use: Secondary | ICD-10-CM | POA: Diagnosis not present

## 2023-11-27 DIAGNOSIS — I2510 Atherosclerotic heart disease of native coronary artery without angina pectoris: Secondary | ICD-10-CM | POA: Diagnosis not present

## 2023-11-27 DIAGNOSIS — I5022 Chronic systolic (congestive) heart failure: Secondary | ICD-10-CM | POA: Diagnosis not present

## 2023-11-27 MED ORDER — ATORVASTATIN CALCIUM 40 MG PO TABS: 40.0000 mg | ORAL_TABLET | Freq: Every day | ORAL | 3 refills | Status: AC

## 2023-11-27 MED ORDER — METOPROLOL SUCCINATE ER 25 MG PO TB24: ORAL_TABLET | ORAL | 3 refills | Status: AC

## 2023-11-27 MED ORDER — APIXABAN 5 MG PO TABS: 5.0000 mg | ORAL_TABLET | Freq: Two times a day (BID) | ORAL | 3 refills | Status: AC

## 2023-11-27 NOTE — Patient Instructions (Signed)
 Medication Instructions:  Your physician recommends the following medication changes.  START TAKING: Lipitor 40 mg daily  *If you need a refill on your cardiac medications before your next appointment, please call your pharmacy*  Lab Work: None ordered at this time  If you have labs (blood work) drawn today and your tests are completely normal, you will receive your results only by: MyChart Message (if you have MyChart) OR A paper copy in the mail If you have any lab test that is abnormal or we need to change your treatment, we will call you to review the results.  Testing/Procedures: None ordered at this time   Follow-Up: At [facility], you and your health needs are our priority.  As part of our continuing mission to provide you with exceptional heart care, our providers are all part of one team.  This team includes your primary Cardiologist (physician) and Advanced Practice Providers or APPs (Physician Assistants and Nurse Practitioners) who all work together to provide you with the care you need, when you need it.  Your next appointment:   1 year(s)  Provider:   You may see Deatrice Cage, MD or Bernardino Bring, PA-C

## 2023-11-27 NOTE — Progress Notes (Signed)
 Cardiology Office Note    Date:  11/27/2023   ID:  Susan Mcmillan, DOB 1945-12-08, MRN [id number]  PCP:  Marikay Eva POUR, PA  Cardiologist:  Deatrice Cage, MD  Electrophysiologist:  None   Chief Complaint: Follow-up  History of Present Illness:   Susan Mcmillan is a 77 y.o. 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  She was found to have significant coronary artery calcification on CT imaging in 2017.  Nuclear stress test showed moderate mid to distal anterior wall ischemia with normal EF.  LHC showed an occluded mid LAD with right to left and left to left collaterals.  She otherwise had moderate RCA disease.  She was medically managed.  Carotid artery ultrasound in 04/2019 showed no significant carotid artery disease.  She had worsening heart failure earlier in 2022 with repeat echo in 09/2020 demonstrating an EF of 35 to 40%, grade 1 diastolic dysfunction, and mild mitral regurgitation.  Subsequent R/LHC in 10/2020 showed no significant change when compared to study in 2017 with chronically occluded mid LAD with collaterals as well as moderate proximal stenosis in the first D1 and moderate RCA disease.  RHC showed normal right and left-sided filling pressures, minimal pulmonary hypertension, and severely reduced cardiac output.  Titration of GDMT has been limited by low blood pressure as well as hypotension/rash associated with Entresto .  She was seen in the office in 04/2021 and reported no angina or symptoms concerning for decompensation.  Her biggest issue at that time continued to be dizziness and poor balance with a spinning sensation suspected to be likely vertigo.  She has subsequently dealt with recurrent vaginal infections/UTIs with  recommendation for her to stop Jardiance  to decrease the risk of recurrent infections.  However, she preferred to continue Jardiance  along with a self recommended homeopathic medication.  She was seen in the office in 10/2021 and had discontinued metoprolol  secondary to positional dizziness with noted improvement in symptoms thereafter.  She was without symptoms of presyncope or syncope.  She remained on Jardiance  and losartan , however these were ultimately discontinued.     She contacted our office on 01/02/2022 noting multiple episodes of dizziness with near syncope, along with generalized weakness and falls.  Telephone note indicates a systolic blood pressure of 96 mmHg.  Given this, she was evaluated on 01/03/2022 and noted multiple episodes of dizziness with near syncope and 2-3 episodes of frank syncope that were sudden in onset and without associated symptoms.  No postictal period.  She declined head CT.  Zio patch, echo, and carotid artery ultrasound were recommended.  We received a call from [employer] in 01/2022 that her Zio patch showed new onset atrial flutter.  Given this, she was initiated on apixaban  with repeat recommendation for head CT, which she declined.  Metoprolol  was titrated to 25 mg daily.  Zio patch monitoring x2 showed a predominant rhythm of sinus with 1 run of WCT lasting 6 beats felt to likely represent SVT with aberrancy, 2% A-fib/flutter burden with the longest episode lasting 35 minutes and 33 seconds and was detected with symptomatic patient events.  Echo on 02/07/2022 showed an EF of 40 to 45%, hypokinesis of the anterior, anteroseptal, and apical region, grade 2 diastolic dysfunction, normal RV systolic function and ventricular cavity  size, mildly dilated left atrium, mild mitral regurgitation, aortic valve sclerosis without evidence of stenosis, and an estimated right atrial pressure of 3 mmHg.  Carotid artery ultrasound at that time showed no evidence of bilateral ICA pathology with  antegrade flow of the bilateral vertebral arteries and normal flow hemodynamics of the bilateral subclavian arteries.  She was last seen in the office in 03/2023 and was doing reasonably well from a cardiac perspective.  She continued to complain of vertigo, especially in certain positions.  She comes in doing well from a cardiac perspective and is without symptoms of angina or cardiac decompensation.  Chronic dizziness/tinnitus is improved following recent chiropractor adjustment.  No presyncope or syncope.  No significant lower extremity swelling, falls, or symptoms concerning for bleeding.  Weight is down 2 pounds compared to her clinic visit in 03/2023.   Labs independently reviewed: 10/2023 - Hgb 14.2, PLT 272, potassium 3.9, BUN 13, serum creatinine 0.7, albumin 4.2, AST/ALT normal, TC 119, TG 114, HDL 47, LDL 49, A1c 5.8 01/2022 - TSH normal  Past Medical History:  Diagnosis Date   Anxiety    CAD (coronary artery disease)    a. 11/2015 MV: mod mid-distal ant ischemia, EF 58% w/ apical HK;  b. 11/2015 Cath: LM nl, LAD 80/161m (fills via collats from RPDA & D1), D1 60ost, LCX small, nl, RCA 50p/m, RPDA nl, RPAV min irregs, RPl1/2/3 min irregs, EF 50-55%.   Cancer of brain [facility]) 03/2017   Dental bridge present    bilateral top.  Permanent retainer - bottom.   Dysuria    History of kidney stones    Hyperlipemia    Hypertension    Hypertensive heart disease    IBS (irritable bowel syndrome)    Ischemic cardiomyopathy    a. 11/2015 V gram: EF 50-55%; b. 09/2020 Echo: EF 35-40%, ant, antsept, apical HK. Gr1 DD. Nl RV fxn. Mild MR.   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 Still smoking ~ 1 cigarette/day.   Urethral caruncle    Vertigo    Vulvar adhesions 10/06/2013    perianal skin bridge noted at posterior fourchette and region of WLE surgical site   Vulvar leukoplakia    Vulvar pain     Past Surgical History:  Procedure Laterality Date   ABDOMINAL HERNIA REPAIR  2014   Dr. Unknown Sharps   ABDOMINAL HYSTERECTOMY  1978   APPENDECTOMY     BRAIN SURGERY     CARDIAC CATHETERIZATION Left 12/07/2015   Procedure: Left Heart Cath and Coronary Angiography;  Surgeon: Deatrice DELENA Cage, MD;  Location: [facility] INVASIVE CV LAB;  Service: Cardiovascular;  Laterality: Left;   CATARACT EXTRACTION W/PHACO Right 05/28/2018   Procedure: CATARACT EXTRACTION PHACO AND INTRAOCULAR LENS PLACEMENT (IOC) RIGHT;  Surgeon: Ferol Rogue, MD;  Location: [facility] ORS;  Service: Ophthalmology;  Laterality: Right;  US  00:56 CDE 7.17 Fluid pack Lot # [id number]   CATARACT EXTRACTION W/PHACO Left 01/06/2019   Procedure: CATARACT EXTRACTION PHACO AND INTRAOCULAR LENS PLACEMENT (IOC) LEFT  00:50.3  19.3%  9.71;  Surgeon: Mittie Gaskin, MD;  Location: [facility];  Service: Ophthalmology;  Laterality: Left;   CERVICAL BIOPSY     Dr. Kathe  CHOLECYSTECTOMY     COLONOSCOPY WITH PROPOFOL  N/A 02/09/2019   Procedure: COLONOSCOPY WITH PROPOFOL ;  Surgeon: Gaylyn Gladis PENNER, MD;  Location: [facility] ENDOSCOPY;  Service: Endoscopy;  Laterality: N/A;   ESOPHAGOGASTRODUODENOSCOPY (EGD) WITH PROPOFOL  N/A 02/09/2019   Procedure: ESOPHAGOGASTRODUODENOSCOPY (EGD) WITH PROPOFOL ;  Surgeon: Gaylyn Gladis PENNER, MD;  Location: [facility] ENDOSCOPY;  Service: Endoscopy;  Laterality: N/A;   MOHS SURGERY Right    Leg   RIGHT/LEFT HEART CATH AND CORONARY ANGIOGRAPHY N/A 10/23/2020   Procedure: RIGHT/LEFT HEART CATH AND CORONARY ANGIOGRAPHY;  Surgeon: Darron Deatrice LABOR, MD;  Location: [facility] INVASIVE CV LAB;  Service: Cardiovascular;  Laterality: N/A;   SKIN BIOPSY     wide local excision  07/2013   vin 3 w/ margins involoved    Current Medications: Current Meds  Medication Sig   ALPRAZolam  (XANAX ) 0.25  MG tablet Take by mouth.   Cyanocobalamin  (VITAMIN B-12) 5000 MCG SUBL Place 5,000 mcg under the tongue daily.   diphenhydrAMINE (BENADRYL) 25 MG tablet Take 25 mg by mouth at bedtime.   FLUoxetine  (PROZAC ) 20 MG capsule Take 20 mg by mouth daily.   hyoscyamine (LEVSIN SL) 0.125 MG SL tablet Place 0.125 mg under the tongue every 6 (six) hours as needed. Place 1 tablet under the tongue every 6 hours as needed for cramping for up to 10 days   meclizine (ANTIVERT) 25 MG tablet Take 1 tablet by mouth 3 (three) times daily as needed.   pantoprazole  (PROTONIX ) 40 MG tablet Take 40 mg by mouth daily.   prednisoLONE acetate (PRED FORTE) 1 % ophthalmic suspension Right eye   [DISCONTINUED] apixaban  (ELIQUIS ) 5 MG TABS tablet Take 1 tablet (5 mg total) by mouth 2 (two) times daily.   [DISCONTINUED] atorvastatin  (LIPITOR) 40 MG tablet TAKE 1 TABLET(40 MG) BY MOUTH DAILY   [DISCONTINUED] metoprolol  succinate (TOPROL -XL) 25 MG 24 hr tablet TAKE 1 TABLET(25 MG) BY MOUTH DAILY (Patient taking differently: 25 mg daily.)    Allergies:   Tape, Codeine, Demerol [meperidine], Erythromycin, Iodinated contrast media, Other, Penicillins, and Entresto  [sacubitril-valsartan]   Social History   Socioeconomic History   Marital status: Married    Spouse name: Actor   Number of children: 2   Years of education: Not on file   Highest education level: Not on file  Occupational History   Occupation: retired  Tobacco Use   Smoking status: Some Days    Current packs/day: 0.00    Average packs/day: 0.3 packs/day for 55.0 years (13.8 ttl pk-yrs)    Types: Cigarettes    Start date: 12/06/1960    Last attempt to quit: 12/07/2015    Years since quitting: 7.9   Smokeless tobacco: Never   Tobacco comments:    smokes about 2 cigarettes a day - Quit again February 2025 when I had he flu. DJM 08/12/2023  Vaping Use   Vaping status: Never Used  Substance and Sexual Activity   Alcohol use: Not Currently    Alcohol/week:  1.0 standard drink of alcohol    Types: 1 Cans of beer per week    Comment: 8 oz Bud Light with supper daily   Drug use: No   Sexual activity: Not Currently    Birth control/protection: Surgical  Other Topics Concern   Not on file  Social History Narrative   Lives in [place]. Previously worked [employer]. Husband with dementia. Children 2 sons.   Social Drivers of Health   Financial Resource Strain: Low Risk  (11/03/2023)   Received [facility]   Overall Financial Resource Strain (CARDIA)    Difficulty of Paying Living Expenses: Not hard at all  Food Insecurity: No Food Insecurity (11/03/2023)   Received from [facility]   Hunger Vital Sign    Within the past 12 months, you worried that your food would run out before you got the money to buy more.: Never true    Within the past 12 months, the food you bought just didn't last and you didn't have money to get more.: Never true  Transportation Needs: No Transportation Needs (11/03/2023)   Received from [facility] - Transportation    In the past 12 months, has lack of transportation kept you from medical appointments or from getting medications?: No    Lack of Transportation (Non-Medical): No  Physical Activity: Not on file  Stress: Not on file  Social Connections: Not on file     Family History:  The patient's family history includes Bone cancer in her father; Cancer in her father; Congestive Heart Failure in her mother; Dementia in her sister; Hyperlipidemia in her sister; Hypertension in her sister; Macular degeneration in her sister. There is no history of Diabetes, Heart disease, or Breast cancer.  ROS:   12-point review of systems is negative unless otherwise noted in the HPI.   EKGs/Labs/Other Studies Reviewed:    Studies reviewed were summarized above. The additional studies were reviewed today:  Carotid artery ultrasound 02/07/2022: Summary:  Right  Carotid: There was no evidence of thrombus, dissection,  atherosclerotic plaque or stenosis in the cervical carotid system.   Left Carotid: There was no evidence of thrombus, dissection,  atherosclerotic plaque or stenosis in the cervical carotid system.   Vertebrals:  Bilateral vertebral arteries demonstrate antegrade flow.  Subclavians: Normal flow hemodynamics were seen in bilateral subclavian arteries.  __________   2D echo 02/07/2022: 1. Left ventricular ejection fraction, by estimation, is 40 to 45%. The  left ventricle has mildly decreased function. The left ventricle  demonstrates regional wall motion abnormalities (hypokinesis of the  anterior, anteroseptal and apical region). Left  ventricular diastolic parameters are consistent with Grade II diastolic  dysfunction (pseudonormalization).   2. Right ventricular systolic function is normal. The right ventricular  size is normal.   3. Left atrial size was mildly dilated.   4. The mitral valve is normal in structure. Mild mitral valve  regurgitation. No evidence of mitral stenosis.   5. The aortic valve is normal in structure. There is mild calcification  of the aortic valve. Aortic valve regurgitation is not visualized. Aortic  valve sclerosis/calcification is present, without any evidence of aortic  stenosis.   6. The inferior vena cava is normal in size with greater than 50%  respiratory variability, suggesting right atrial pressure of 3 mmHg.  __________   Zio patch 12/2021: Monitor 1 Patient had a min HR of 51 bpm, max HR of 123 bpm, and avg HR of 71 bpm. Predominant underlying rhythm was Sinus Rhythm.  Rare PACs and rare PVCs.   Monitor 2 Patient had a min HR of 46 bpm, max HR of 188 bpm, and avg HR of 78 bpm. Predominant underlying rhythm was Sinus Rhythm. 1 run of wide-complex tachycardia  lasting 6 beats with a max rate of 126 bpm (avg 109 bpm).  This is likely due to an SVT with aberrancy. Atrial  Flutter/fibrillation occurred (2% burden), ranging from 87-188 bpm (avg of 140 bpm), the longest lasting  35 mins 33 secs with an avg rate of 154 bpm. Atrial Flutter was detected within +/- 45 seconds of symptomatic patient event(s).  Rare PACs and rare PVCs. __________   [facility] 10/23/2020: Ost 1st Diag to 1st Diag lesion is 60% stenosed. Prox RCA to Mid RCA lesion is 50% stenosed. Mid LAD-1 lesion is 80% stenosed. Mid LAD-2 lesion is 100% stenosed. 1st Diag lesion is 60% stenosed.   1.  No significant change since 2017.  Chronically occluded mid LAD with collaterals from a large first diagonal as well as right coronary artery.  The first diagonal has moderate proximal stenosis which has been stable.  RCA has diffuse 50% disease which also has been stable. 2.  Left ventricular angiography was not performed.  EF was moderately reduced by echo. 3.  Right heart catheterization showed normal right and left-sided filling pressures, minimal pulmonary hypertension and severely reduced cardiac output.   Recommendations: No indication for revascularization.  Recommend optimizing heart failure medications. We will plan on switching the patient to Entresto , adding spironolactone and likely adding an SGLT2 inhibitor. __________   2D echo 09/28/2020: 1. Left ventricular ejection fraction, by estimation, is 35 to 40%. The  left ventricle has moderately decreased function. The left ventricle  demonstrates regional wall motion abnormalities (anterior, anteroseptal,  apical hypokinesis). Left ventricular  diastolic parameters are consistent with Grade I diastolic dysfunction  (impaired relaxation).   2. Right ventricular systolic function is normal. The right ventricular  size is normal. Tricuspid regurgitation signal is inadequate for assessing  PA pressure.   3. The mitral valve is normal in structure. Mild mitral valve  regurgitation. __________   Carotid artery ultrasound 04/19/2019: Summary:   Right Carotid: There is no evidence of stenosis in the right ICA.   Left Carotid: There is no evidence of stenosis in the left ICA.   Vertebrals:  Bilateral vertebral arteries demonstrate antegrade flow.  Subclavians: Normal flow hemodynamics were seen in bilateral subclavian arteries. __________   LHC 12/07/2015: The left ventricular systolic function is normal. LV end diastolic pressure is normal. The left ventricular ejection fraction is 50-55% by visual estimate. There is no mitral valve regurgitation. There is no aortic valve stenosis. Prox RCA to Mid RCA lesion, 50 %stenosed. Mid LAD lesion, 100 %stenosed. Ost 1st Diag to 1st Diag lesion, 60 %stenosed. Prox LAD to Mid LAD lesion, 80 %stenosed.   1.severe one-vessel coronary artery disease with chronically occluded mid LAD with faint right to left collaterals and left to left collaterals from the diagonal branch. Otherwise there is moderate disease affecting the mid RCA and ostial first diagonal which is large in size. 2. Low normal LV systolic function with an ejection fraction of 50-55% with mild anterior wall hypokinesis.moderately elevated filling left ventricular end-diastolic pressure but blood pressure was also moderately elevated at that time.   Recommendations: Continue aggressive medical therapy. Recommend a target LDL of less than 70. The LAD itself appears to be diffusely diseased in the proximal and midsegment. I doubt that she would get much benefit from LAD CTO PCI. __________   Lexiscan  MPI 11/30/2015:There was no ST segment deviation noted during stress. No T wave inversion was noted during stress.   Pharmacological myocardial perfusion imaging study with small region of fixed defect in the apical region, With small to moderate sized region of moderate ischemia noted in the mid to distal anterior wall  Hypokinesis in the apical region EF estimated at 58% No EKG changes concerning for ischemia at peak  stress or in  recovery. Baseline EKG with T wave abnormality through the precordial leads consistent with ischemia Moderate risk scan   EKG:  EKG is ordered today.  The EKG ordered today demonstrates sinus bradycardia, 57 bpm, anterolateral ST-T changes consistent with prior tracing with some improvement in V5-V6  Recent Labs: No results found for requested labs within last 365 days.  Recent Lipid Panel    Component Value Date/Time   CHOL 149 02/06/2016 0953   TRIG 155 (H) 02/06/2016 0953   HDL 54 02/06/2016 0953   CHOLHDL 2.8 02/06/2016 0953   CHOLHDL 4 05/17/2015 1442   VLDL 57.6 (H) 05/17/2015 1442   LDLCALC 64 02/06/2016 0953   LDLDIRECT 88.0 05/17/2015 1442    PHYSICAL EXAM:    VS:  BP (!) 120/56 (BP Location: Left Arm, Patient Position: Sitting, Cuff Size: Normal)   Pulse (!) 57   Ht 5' 2 (1.575 m)   Wt 152 lb 12.8 oz (69.3 kg)   SpO2 99%   BMI 27.95 kg/m   BMI: Body mass index is 27.95 kg/m.  Physical Exam Vitals reviewed.  Constitutional:      Appearance: She is well-developed.  HENT:     Head: Normocephalic and atraumatic.  Eyes:     General:        Right eye: No discharge.        Left eye: No discharge.  Cardiovascular:     Rate and Rhythm: Normal rate and regular rhythm.     Heart sounds: Normal heart sounds, S1 normal and S2 normal. Heart sounds not distant. No midsystolic click and no opening snap. No murmur heard.    No friction rub.  Pulmonary:     Effort: Pulmonary effort is normal. No respiratory distress.     Breath sounds: Normal breath sounds. No decreased breath sounds, wheezing, rhonchi or rales.  Chest:     Chest wall: No tenderness.  Musculoskeletal:     Cervical back: Normal range of motion.     Right lower leg: No edema.     Left lower leg: No edema.  Skin:    General: Skin is warm and dry.     Nails: There is no clubbing.  Neurological:     Mental Status: She is alert and oriented to person, place, and time.  Psychiatric:        Speech:  Speech normal.        Behavior: Behavior normal.        Thought Content: Thought content normal.        Judgment: Judgment normal.     Wt Readings from Last 3 Encounters:  11/27/23 152 lb 12.8 oz (69.3 kg)  04/01/23 159 lb 2 oz (72.2 kg)  03/14/23 157 lb 4.8 oz (71.4 kg)     ASSESSMENT & PLAN:   Atrial flutter: Maintaining sinus rhythm with a mildly bradycardic rate and is asymptomatic.  Remains on Toprol -XL 25 mg.  CHADS2VASc at least 6 (CHF, HTN, age x 2, vascular disease, sex category).  Remains on apixaban  5 mg twice daily and does not meet reduced dosing criteria.  No falls or symptoms concerning for bleeding.  Recent labs stable.  CAD involving native coronary arteries without angina: No symptoms of angina or cardiac decompensation.  She has chronically occluded LAD with collaterals and moderate diagonal and RCA disease.  Continue aggressive risk factor modification including apixaban  in lieu of aspirin  given underlying atrial flutter to minimize bleeding risk, metoprolol , and atorvastatin .  No  indication for further ischemic testing at this time.  HFrEF secondary to ICM: She appears euvolemic and well compensated with most recent echo showing an EF of 40 to 45%.  Relative hypotension and off target effective limited de-escalation and led to the de-escalation of GDMT.  She did not tolerate losartan  or Entresto  due to hypotension and rash associated with the latter, or SGLT2 inhibitor due to frequent infections.  She remains on Toprol -XL 25 mg daily.  Not requiring a standing loop diuretic.  HTN: Blood pressure is well-controlled in the office today.  She remains on Toprol -XL 25 mg.  HLD: LDL 49 in 10/2023.  Continue atorvastatin  2 mg daily (that she was taking 40 mg at time of most recent lipid panel).  Tobacco use: Complete smoking cessation is encouraged.  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  Ulnar pulse normal.  Dizziness: Longstanding issue and felt to be  consistent with vertigo.  Improved following recent chiropractic adjustment.    Disposition: F/u with Dr. Darron or an APP in 12 months, sooner if needed.   Medication Adjustments/Labs and Tests Ordered: Current medicines are reviewed at length with the patient today.  Concerns regarding medicines are outlined above. Medication changes, Labs and Tests ordered today are summarized above and listed in the Patient Instructions accessible in Encounters.   Signed, Bernardino Bring, PA-C 11/27/2023 4:26 PM     [facility] [place] [address] [place], [place] [address] [phone]

## 2023-12-02 DIAGNOSIS — M9905 Segmental and somatic dysfunction of pelvic region: Secondary | ICD-10-CM | POA: Diagnosis not present

## 2023-12-02 DIAGNOSIS — M9903 Segmental and somatic dysfunction of lumbar region: Secondary | ICD-10-CM | POA: Diagnosis not present

## 2023-12-02 DIAGNOSIS — M9902 Segmental and somatic dysfunction of thoracic region: Secondary | ICD-10-CM | POA: Diagnosis not present

## 2023-12-02 DIAGNOSIS — M9901 Segmental and somatic dysfunction of cervical region: Secondary | ICD-10-CM | POA: Diagnosis not present

## 2023-12-02 DIAGNOSIS — M9904 Segmental and somatic dysfunction of sacral region: Secondary | ICD-10-CM | POA: Diagnosis not present

## 2023-12-02 DIAGNOSIS — M5417 Radiculopathy, lumbosacral region: Secondary | ICD-10-CM | POA: Diagnosis not present

## 2023-12-02 DIAGNOSIS — S335XXA Sprain of ligaments of lumbar spine, initial encounter: Secondary | ICD-10-CM | POA: Diagnosis not present

## 2023-12-16 ENCOUNTER — Ambulatory Visit
Admission: RE | Admit: 2023-12-16 | Discharge: 2023-12-16 | Disposition: A | Discharge status: Home / Self Care | Payer: PPO | Source: Ambulatory Visit | Attending: Internal Medicine | Admitting: Internal Medicine

## 2023-12-16 DIAGNOSIS — C8510 Unspecified B-cell lymphoma, unspecified site: Secondary | ICD-10-CM | POA: Insufficient documentation

## 2023-12-16 MED ORDER — GADOBUTROL 1 MMOL/ML IV SOLN
7.5000 mL | Freq: Once | INTRAVENOUS | Status: AC | PRN
  Administered 2023-12-16: 7.5 mL via INTRAVENOUS

## 2023-12-31 ENCOUNTER — Telehealth: Payer: Self-pay | Admitting: Internal Medicine

## 2023-12-31 NOTE — Telephone Encounter (Signed)
 LVM with pt about scheduling change. She is now scheduled to see Vaslow on 9/5 at 1130. I asked her to call me back if this date or time did not work for her.

## 2024-01-02 ENCOUNTER — Ambulatory Visit: Payer: PPO | Admitting: Internal Medicine

## 2024-01-06 ENCOUNTER — Encounter: Payer: Self-pay | Admitting: Acute Care

## 2024-01-09 ENCOUNTER — Ambulatory Visit: Admitting: Internal Medicine

## 2024-01-16 ENCOUNTER — Inpatient Hospital Stay: Attending: Internal Medicine | Admitting: Internal Medicine

## 2024-01-16 ENCOUNTER — Encounter: Payer: Self-pay | Admitting: Internal Medicine

## 2024-01-16 VITALS — BP 139/66 | HR 58 | Temp 97.3°F | Resp 18 | Wt 154.4 lb

## 2024-01-16 DIAGNOSIS — Z79899 Other long term (current) drug therapy: Secondary | ICD-10-CM | POA: Diagnosis not present

## 2024-01-16 DIAGNOSIS — Z885 Allergy status to narcotic agent status: Secondary | ICD-10-CM | POA: Insufficient documentation

## 2024-01-16 DIAGNOSIS — Z86002 Personal history of in-situ neoplasm of other and unspecified genital organs: Secondary | ICD-10-CM | POA: Diagnosis not present

## 2024-01-16 DIAGNOSIS — Z923 Personal history of irradiation: Secondary | ICD-10-CM | POA: Diagnosis not present

## 2024-01-16 DIAGNOSIS — Z90710 Acquired absence of both cervix and uterus: Secondary | ICD-10-CM | POA: Insufficient documentation

## 2024-01-16 DIAGNOSIS — M549 Dorsalgia, unspecified: Secondary | ICD-10-CM | POA: Diagnosis not present

## 2024-01-16 DIAGNOSIS — C8510 Unspecified B-cell lymphoma, unspecified site: Secondary | ICD-10-CM | POA: Diagnosis not present

## 2024-01-16 DIAGNOSIS — Z7901 Long term (current) use of anticoagulants: Secondary | ICD-10-CM | POA: Insufficient documentation

## 2024-01-16 DIAGNOSIS — Z87412 Personal history of vulvar dysplasia: Secondary | ICD-10-CM | POA: Diagnosis not present

## 2024-01-16 DIAGNOSIS — Z880 Allergy status to penicillin: Secondary | ICD-10-CM | POA: Diagnosis not present

## 2024-01-16 DIAGNOSIS — F17210 Nicotine dependence, cigarettes, uncomplicated: Secondary | ICD-10-CM | POA: Diagnosis not present

## 2024-01-16 DIAGNOSIS — Z87442 Personal history of urinary calculi: Secondary | ICD-10-CM | POA: Insufficient documentation

## 2024-01-16 DIAGNOSIS — Z881 Allergy status to other antibiotic agents status: Secondary | ICD-10-CM | POA: Diagnosis not present

## 2024-01-16 DIAGNOSIS — I2510 Atherosclerotic heart disease of native coronary artery without angina pectoris: Secondary | ICD-10-CM | POA: Insufficient documentation

## 2024-01-16 DIAGNOSIS — Z818 Family history of other mental and behavioral disorders: Secondary | ICD-10-CM | POA: Insufficient documentation

## 2024-01-16 DIAGNOSIS — Z8249 Family history of ischemic heart disease and other diseases of the circulatory system: Secondary | ICD-10-CM | POA: Insufficient documentation

## 2024-01-16 DIAGNOSIS — Z809 Family history of malignant neoplasm, unspecified: Secondary | ICD-10-CM | POA: Insufficient documentation

## 2024-01-16 DIAGNOSIS — R42 Dizziness and giddiness: Secondary | ICD-10-CM | POA: Insufficient documentation

## 2024-01-16 DIAGNOSIS — Z9049 Acquired absence of other specified parts of digestive tract: Secondary | ICD-10-CM | POA: Diagnosis not present

## 2024-01-16 DIAGNOSIS — R202 Paresthesia of skin: Secondary | ICD-10-CM | POA: Insufficient documentation

## 2024-01-16 DIAGNOSIS — C8309 Small cell B-cell lymphoma, extranodal and solid organ sites: Secondary | ICD-10-CM | POA: Insufficient documentation

## 2024-01-16 DIAGNOSIS — Z83438 Family history of other disorder of lipoprotein metabolism and other lipidemia: Secondary | ICD-10-CM | POA: Diagnosis not present

## 2024-01-16 DIAGNOSIS — I252 Old myocardial infarction: Secondary | ICD-10-CM | POA: Insufficient documentation

## 2024-01-16 DIAGNOSIS — E785 Hyperlipidemia, unspecified: Secondary | ICD-10-CM | POA: Insufficient documentation

## 2024-01-16 DIAGNOSIS — R531 Weakness: Secondary | ICD-10-CM | POA: Insufficient documentation

## 2024-01-16 DIAGNOSIS — Z85841 Personal history of malignant neoplasm of brain: Secondary | ICD-10-CM | POA: Insufficient documentation

## 2024-01-16 DIAGNOSIS — Z85828 Personal history of other malignant neoplasm of skin: Secondary | ICD-10-CM | POA: Diagnosis not present

## 2024-01-16 NOTE — Progress Notes (Signed)
 [facility] [address]  [place], [place] [address] [phone]   Interval Evaluation  Date of Service: 01/16/24 Patient Name: Susan Mcmillan Patient MRN: [id number] Patient DOB: [birth year] Provider: Arthea MARLA Manns, MD  Identifying Statement:  Susan Mcmillan is a 78 y.o. 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 Brain imaging revealed a left dural based lesion.   03/27/2017 Surgery  Subtotal resection by Dr. Bluford  03/27/2017 Initial Diagnosis  Mature B-cell lymphoma  06/04/2017 - 06/20/2017 Radiation  2340 cGy in 17 fractions; Dr. Michaela   Biomarkers:  MGMT Unknown.  IDH 1/2 Unknown.  EGFR Unknown  TERT Unknown   Interval History: Susan Mcmillan presents today for follow up after recent MRI brain.  No new or progressive changes reported.  Remains with some imabalance, unchanged from prior.  Some back pain as well, she is having to care for her 69 y/o husband after a hip fracture.  Denies seizures, headaches.  H+P (03/14/23) Patient presents today for follow up after recent MRI for her CNS marginal lymphoma.  She acknowledges ongoing mild right leg weakness and difficulty with gait.  Dizziness and vertigo is a problem at times, in particular when she bends forward or lays back in the bed.  She has meclizine from her PCP but this hasn't helped much.  Otherwise no new or progressive changes.  Medications: Current Outpatient Medications on File Prior to Visit  Medication Sig Dispense Refill   ALPRAZolam  (XANAX ) 0.25 MG tablet Take by mouth.     apixaban  (ELIQUIS ) 5 MG TABS tablet Take 1 tablet (5 mg total) by mouth 2 (two) times daily. 180 tablet 3   atorvastatin  (LIPITOR) 40 MG tablet Take 1 tablet (40 mg total) by mouth daily. 90 tablet 3   Cyanocobalamin  (VITAMIN B-12) 5000 MCG SUBL Place  5,000 mcg under the tongue daily.     diphenhydrAMINE (BENADRYL) 25 MG tablet Take 25 mg by mouth at bedtime.     FLUoxetine  (PROZAC ) 20 MG capsule Take 20 mg by mouth daily.     hyoscyamine (LEVSIN SL) 0.125 MG SL tablet Place 0.125 mg under the tongue every 6 (six) hours as needed. Place 1 tablet under the tongue every 6 hours as needed for cramping for up to 10 days     meclizine (ANTIVERT) 25 MG tablet Take 1 tablet by mouth 3 (three) times daily as needed.     metoprolol  succinate (TOPROL -XL) 25 MG 24 hr tablet TAKE 1 TABLET(25 MG) BY MOUTH DAILY 90 tablet 3   pantoprazole  (PROTONIX ) 40 MG tablet Take 40 mg by mouth daily.     prednisoLONE acetate (PRED FORTE) 1 % ophthalmic suspension Right eye     No current facility-administered medications on file prior to visit.    Allergies:  Allergies  Allergen Reactions   Tape Rash    bruises   Codeine Nausea And Vomiting   Demerol [Meperidine] Other (See Comments)    Heavy sedation   Erythromycin Nausea Only    All Mycins cause nausea   Iodinated Contrast Media Nausea And Vomiting   Losartan  Other (See Comments)   Other Other (See Comments)   Penicillins Nausea Only    Can do amoxicillin Has patient had a PCN reaction causing immediate rash, facial/tongue/throat swelling, SOB or lightheadedness with hypotension: Yes Has patient had a PCN reaction causing  severe rash involving mucus membranes or skin necrosis: No Has patient had a PCN reaction that required hospitalization No Has patient had a PCN reaction occurring within the last 10 years: No If all of the above answers are NO, then may proceed with Cephalosporin use.    Entresto  [Sacubitril-Valsartan] Rash   Past Medical History:  Past Medical History:  Diagnosis Date   Anxiety    CAD (coronary artery disease)    a. 11/2015 MV: mod mid-distal ant ischemia, EF 58% w/ apical HK;  b. 11/2015 Cath: LM nl, LAD 80/152m (fills via collats from RPDA & D1), D1 60ost, LCX small, nl, RCA  50p/m, RPDA nl, RPAV min irregs, RPl1/2/3 min irregs, EF 50-55%.   Cancer of brain [facility]) 03/2017   Dental bridge present    bilateral top.  Permanent retainer - bottom.   Dysuria    History of kidney stones    Hyperlipemia    Hypertension    Hypertensive heart disease    IBS (irritable bowel syndrome)    Ischemic cardiomyopathy    a. 11/2015 V gram: EF 50-55%; b. 09/2020 Echo: EF 35-40%, ant, antsept, apical HK. Gr1 DD. Nl RV fxn. Mild MR.   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 Still smoking ~ 1 cigarette/day.   Urethral caruncle    Vertigo    Vulvar adhesions 10/06/2013   perianal skin bridge noted at posterior fourchette and region of WLE surgical site   Vulvar leukoplakia    Vulvar pain    Past Surgical History:  Past Surgical History:  Procedure Laterality Date   ABDOMINAL HERNIA REPAIR  2014   Dr. Unknown Sharps   ABDOMINAL HYSTERECTOMY  1978   APPENDECTOMY     BRAIN SURGERY     CARDIAC CATHETERIZATION Left 12/07/2015   Procedure: Left Heart Cath and Coronary Angiography;  Surgeon: Deatrice DELENA Cage, MD;  Location: [facility] INVASIVE CV LAB;  Service: Cardiovascular;  Laterality: Left;   CATARACT EXTRACTION W/PHACO Right 05/28/2018   Procedure: CATARACT EXTRACTION PHACO AND INTRAOCULAR LENS PLACEMENT (IOC) RIGHT;  Surgeon: Ferol Rogue, MD;  Location: [facility] ORS;  Service: Ophthalmology;  Laterality: Right;  US  00:56 CDE 7.17 Fluid pack Lot # [id number]   CATARACT EXTRACTION W/PHACO Left 01/06/2019   Procedure: CATARACT EXTRACTION PHACO AND INTRAOCULAR LENS PLACEMENT (IOC) LEFT  00:50.3  19.3%  9.71;  Surgeon: Mittie Gaskin, MD;  Location: [facility];  Service: Ophthalmology;  Laterality: Left;   CERVICAL BIOPSY     Dr. Kathe    CHOLECYSTECTOMY     COLONOSCOPY WITH PROPOFOL  N/A 02/09/2019   Procedure: COLONOSCOPY WITH PROPOFOL ;  Surgeon: Gaylyn Gladis PENNER, MD;  Location: [facility] ENDOSCOPY;  Service: Endoscopy;  Laterality: N/A;   ESOPHAGOGASTRODUODENOSCOPY (EGD) WITH PROPOFOL  N/A 02/09/2019   Procedure: ESOPHAGOGASTRODUODENOSCOPY (EGD) WITH PROPOFOL ;  Surgeon: Gaylyn Gladis PENNER, MD;  Location: [facility] ENDOSCOPY;  Service: Endoscopy;  Laterality: N/A;   MOHS SURGERY Right    Leg   RIGHT/LEFT HEART CATH AND CORONARY ANGIOGRAPHY N/A 10/23/2020   Procedure: RIGHT/LEFT HEART CATH AND CORONARY ANGIOGRAPHY;  Surgeon: Cage Deatrice DELENA, MD;  Location: [facility] INVASIVE CV LAB;  Service: Cardiovascular;  Laterality: N/A;   SKIN BIOPSY     wide local  excision  07/2013   vin 3 w/ margins involoved   Social History:  Social History   Socioeconomic History   Marital status: Married    Spouse name: Actor   Number of children: 2   Years of education: Not on file   Highest education level: Not on file  Occupational History   Occupation: retired  Tobacco Use   Smoking status: Some Days    Current packs/day: 0.00    Average packs/day: 0.3 packs/day for 55.0 years (13.8 ttl pk-yrs)    Types: Cigarettes    Start date: 12/06/1960    Last attempt to quit: 12/07/2015    Years since quitting: 8.1   Smokeless tobacco: Never   Tobacco comments:    smokes about 2 cigarettes a day - Quit again February 2025 when I had he flu. DJM 08/12/2023  Vaping Use   Vaping status: Never Used  Substance and Sexual Activity   Alcohol use: Not Currently    Alcohol/week: 1.0 standard drink of alcohol    Types: 1 Cans of beer per week    Comment: 8 oz Bud Light with supper daily   Drug use: No   Sexual activity: Not Currently    Birth control/protection: Surgical  Other Topics Concern   Not on file  Social History Narrative   Lives in [place]. Previously worked [employer]. Husband with dementia. Children 2 sons.   Social Drivers of Manufacturing engineer Strain: Low Risk  (11/03/2023)   Received from [facility]   Overall Financial Resource Strain (CARDIA)    Difficulty of Paying Living Expenses: Not hard at all  Food Insecurity: No Food Insecurity (11/03/2023)   Received from [facility]   Hunger Vital Sign    Within the past 12 months, you worried that your food would run out before you got the money to buy more.: Never true    Within the past 12 months, the food you bought just didn't last and you didn't have money to get more.: Never true  Transportation Needs: No Transportation Needs (11/03/2023)   Received from [facility] - Transportation    In the past 12 months, has lack of transportation kept you from medical appointments or from getting medications?: No    Lack of Transportation (Non-Medical): No  Physical Activity: Not on file  Stress: Not on file  Social Connections: Not on file  Intimate Partner Violence: Not on file   Family History:  Family History  Problem Relation Age of Onset   Congestive Heart Failure Mother    Bone cancer Father    Cancer Father        blood cancer   Hyperlipidemia Sister    Hypertension Sister    Macular degeneration Sister    Dementia Sister    Diabetes Neg Hx    Heart disease Neg Hx    Breast cancer Neg Hx     Review of Systems: Constitutional: Doesn't report fevers, chills or abnormal weight loss Eyes: Doesn't report blurriness of vision Ears, nose, mouth, throat, and face: Doesn't report sore throat Respiratory: Doesn't report cough, dyspnea or wheezes Cardiovascular: Doesn't report palpitation, chest discomfort  Gastrointestinal:  Doesn't report nausea, constipation, diarrhea GU: Doesn't report incontinence Skin: Doesn't report skin rashes Neurological: Per HPI Musculoskeletal: Doesn't report joint pain Behavioral/Psych: Doesn't report anxiety  Physical Exam: Vitals:   01/16/24 1135  BP:  139/66  Pulse: (!) 58  Resp: 18  Temp: (!) 97.3 F (36.3 C)  SpO2: 98%   KPS: 80. General: Alert, cooperative, pleasant, in no acute distress Head: Normal EENT: No conjunctival injection or scleral icterus.  Lungs: Resp effort normal Cardiac: Regular rate Abdomen: Non-distended abdomen Skin: No rashes cyanosis or petechiae. Extremities: No clubbing or edema  Neurologic Exam: Mental Status: Awake, alert, attentive to examiner. Oriented to self and environment. Language is fluent with intact comprehension.  Cranial Nerves: Visual acuity is grossly normal. Visual fields are full. Extra-ocular movements intact. No ptosis. Face is symmetric Motor: Tone and bulk are normal. Power is 4+/5 in right leg. Reflexes are symmetric, no pathologic reflexes present.  Sensory: Intact to light touch Gait: Hemiparetic   Labs: I have reviewed the data as listed    Component Value Date/Time   NA 139 01/22/2022 1639   NA 141 10/13/2020 1553   NA 136 07/27/2013 1511   K 3.8 01/22/2022 1639   K 3.6 07/27/2013 1511   CL 103 01/22/2022 1639   CL 102 07/27/2013 1511   CO2 26 01/22/2022 1639   CO2 30 07/27/2013 1511   GLUCOSE 91 01/22/2022 1639   GLUCOSE 87 07/27/2013 1511   BUN 14 01/22/2022 1639   BUN 12 10/13/2020 1553   BUN 13 07/27/2013 1511   CREATININE 0.73 01/22/2022 1639   CREATININE 0.77 05/27/2014 1600   CALCIUM  9.6 01/22/2022 1639   CALCIUM  9.3 07/27/2013 1511   PROT 7.2 12/04/2021 1419   PROT 6.7 02/06/2016 0953   ALBUMIN 3.9 12/04/2021 1419   ALBUMIN 4.2 02/06/2016 0953   AST 22 12/04/2021 1419   ALT 19 12/04/2021 1419   ALKPHOS 82 12/04/2021 1419   BILITOT 0.4 12/04/2021 1419   BILITOT 0.5 02/06/2016 0953   GFRNONAA >60 01/22/2022 1639   GFRNONAA >60 07/27/2013 1511   GFRAA >60 12/01/2019 1302   GFRAA >60 07/27/2013 1511   Lab Results  Component Value Date   WBC 7.4 01/22/2022   NEUTROABS 5.2 01/22/2022   HGB 14.2 01/22/2022   HCT 43.2 01/22/2022   MCV 88.0  01/22/2022   PLT 339 01/22/2022    Imaging:  [facility] Clinician Interpretation: I have personally reviewed the CNS images as listed.  My interpretation, in the context of the patient's clinical presentation, is stable disease  No results found.   Pathology: A-B. Brain tumor, biopsies:   Mature B-cell lymphoma with plasmacytic differentiation. See comment.   Comment: Given the extensive plasmacytic differentiation and dural involvement, we favor a marginal zone lymphoma (MZL), but there are scattered lymphoid follicles within the tissue that are not clearly reactive.  We cannot exclude a follicular lymphoma with plasmacytic differentiation or a composite lymphoma (MZL and follicular lymphoma).  We have ordered FISH for the t(14;18) translocation seen in over 80% of follicular lymphomas, and if possible, will also evaluate the most common translocation seen in extranodal marginal zone lymphomas.  Results to follow in a separate report from cytogenetics, and will be integrated in a future addendum to this case.     The associated flow cytometry ([id number]) shows a kappa-restricted B-cell population and increased plasma cells, consistent with the morphologic findings.   Dr. Ludie Flatten of the Division of Hematopathology reviewed this case and concurs. Dr. Sharolyn Kung of the Division of Neuropathology reviewed this case and concurs that there is no evidence of meningioma.   Assessment/Plan B-cell lymphoma, unspecified B-cell lymphoma type, unspecified body region [facility])  We appreciate the opportunity to participate in the care of Susan Miranda  Mcmillan.   She is clinically stable today, with baseline gait dysfunction.     MRI brain demonstrates stable findings, now 6 years removed from radiation therapy.   She has neglected any further imaging; we have counseled her to let us  know if she develops neurologic symptoms.   We ask that Susan Mcmillan return to clinic as needed.  All questions  were answered. The patient knows to call the clinic with any problems, questions or concerns. No barriers to learning were detected.  The total time spent in the encounter was 40 minutes and more than 50% was on counseling and review of test results   Arthea MARLA Manns, MD Medical Director of Neuro-Oncology [facility] 01/16/24 11:42 AM

## 2024-01-26 ENCOUNTER — Other Ambulatory Visit: Payer: Self-pay | Admitting: Acute Care

## 2024-01-26 DIAGNOSIS — Z122 Encounter for screening for malignant neoplasm of respiratory organs: Secondary | ICD-10-CM

## 2024-01-26 DIAGNOSIS — Z87891 Personal history of nicotine dependence: Secondary | ICD-10-CM

## 2024-01-26 DIAGNOSIS — F17210 Nicotine dependence, cigarettes, uncomplicated: Secondary | ICD-10-CM

## 2024-01-27 DIAGNOSIS — L853 Xerosis cutis: Secondary | ICD-10-CM | POA: Diagnosis not present

## 2024-01-27 DIAGNOSIS — Z85828 Personal history of other malignant neoplasm of skin: Secondary | ICD-10-CM | POA: Diagnosis not present

## 2024-01-27 DIAGNOSIS — L249 Irritant contact dermatitis, unspecified cause: Secondary | ICD-10-CM | POA: Diagnosis not present

## 2024-01-27 DIAGNOSIS — D229 Melanocytic nevi, unspecified: Secondary | ICD-10-CM | POA: Diagnosis not present

## 2024-01-27 DIAGNOSIS — L814 Other melanin hyperpigmentation: Secondary | ICD-10-CM | POA: Diagnosis not present

## 2024-01-27 DIAGNOSIS — L820 Inflamed seborrheic keratosis: Secondary | ICD-10-CM | POA: Diagnosis not present

## 2024-01-27 DIAGNOSIS — L570 Actinic keratosis: Secondary | ICD-10-CM | POA: Diagnosis not present

## 2024-01-27 DIAGNOSIS — L578 Other skin changes due to chronic exposure to nonionizing radiation: Secondary | ICD-10-CM | POA: Diagnosis not present

## 2024-01-30 ENCOUNTER — Ambulatory Visit
Admission: RE | Admit: 2024-01-30 | Discharge: 2024-01-30 | Disposition: A | Discharge status: Home / Self Care | Source: Ambulatory Visit | Attending: Physician Assistant | Admitting: Physician Assistant

## 2024-01-30 DIAGNOSIS — F17210 Nicotine dependence, cigarettes, uncomplicated: Secondary | ICD-10-CM | POA: Insufficient documentation

## 2024-01-30 DIAGNOSIS — Z87891 Personal history of nicotine dependence: Secondary | ICD-10-CM | POA: Diagnosis not present

## 2024-01-30 DIAGNOSIS — Z122 Encounter for screening for malignant neoplasm of respiratory organs: Secondary | ICD-10-CM | POA: Diagnosis not present

## 2024-02-03 DIAGNOSIS — S335XXA Sprain of ligaments of lumbar spine, initial encounter: Secondary | ICD-10-CM | POA: Diagnosis not present

## 2024-02-03 DIAGNOSIS — M9902 Segmental and somatic dysfunction of thoracic region: Secondary | ICD-10-CM | POA: Diagnosis not present

## 2024-02-03 DIAGNOSIS — M9903 Segmental and somatic dysfunction of lumbar region: Secondary | ICD-10-CM | POA: Diagnosis not present

## 2024-02-03 DIAGNOSIS — M9905 Segmental and somatic dysfunction of pelvic region: Secondary | ICD-10-CM | POA: Diagnosis not present

## 2024-02-03 DIAGNOSIS — M5417 Radiculopathy, lumbosacral region: Secondary | ICD-10-CM | POA: Diagnosis not present

## 2024-02-03 DIAGNOSIS — M9904 Segmental and somatic dysfunction of sacral region: Secondary | ICD-10-CM | POA: Diagnosis not present

## 2024-02-03 DIAGNOSIS — M9901 Segmental and somatic dysfunction of cervical region: Secondary | ICD-10-CM | POA: Diagnosis not present

## 2025-01-14 ENCOUNTER — Ambulatory Visit: Admitting: Internal Medicine
